# Patient Record
Sex: Male | Born: 1935 | Race: White | Hispanic: No | Marital: Married | Smoking: Former smoker
Health system: Southern US, Community
[De-identification: ages and names within clinical notes are randomized; demographics above are authoritative.]

## PROBLEM LIST (undated history)

## (undated) DIAGNOSIS — M1990 Unspecified osteoarthritis, unspecified site: Secondary | ICD-10-CM

## (undated) DIAGNOSIS — R972 Elevated prostate specific antigen [PSA]: Secondary | ICD-10-CM

## (undated) DIAGNOSIS — E279 Disorder of adrenal gland, unspecified: Secondary | ICD-10-CM

## (undated) DIAGNOSIS — I351 Nonrheumatic aortic (valve) insufficiency: Secondary | ICD-10-CM

## (undated) DIAGNOSIS — I10 Essential (primary) hypertension: Secondary | ICD-10-CM

## (undated) DIAGNOSIS — I639 Cerebral infarction, unspecified: Secondary | ICD-10-CM

## (undated) DIAGNOSIS — I440 Atrioventricular block, first degree: Secondary | ICD-10-CM

## (undated) HISTORY — PX: TONSILLECTOMY AND ADENOIDECTOMY: SHX28

## (undated) HISTORY — DX: Essential (primary) hypertension: I10

## (undated) HISTORY — PX: PELVIC FRACTURE SURGERY: SHX119

## (undated) HISTORY — PX: OTHER SURGICAL HISTORY: SHX169

---

## 1987-09-21 HISTORY — PX: HAND SURGERY: SHX662

## 2001-01-22 ENCOUNTER — Ambulatory Visit (HOSPITAL_COMMUNITY): Admission: RE | Admit: 2001-01-22 | Discharge: 2001-01-22 | Payer: Self-pay | Admitting: Gastroenterology

## 2001-01-22 ENCOUNTER — Encounter (INDEPENDENT_AMBULATORY_CARE_PROVIDER_SITE_OTHER): Payer: Self-pay | Admitting: Specialist

## 2008-07-19 ENCOUNTER — Ambulatory Visit (HOSPITAL_COMMUNITY): Admission: RE | Admit: 2008-07-19 | Discharge: 2008-07-19 | Payer: Self-pay | Admitting: Orthopedic Surgery

## 2010-03-16 ENCOUNTER — Ambulatory Visit: Payer: Self-pay | Admitting: Cardiology

## 2010-09-18 ENCOUNTER — Ambulatory Visit (INDEPENDENT_AMBULATORY_CARE_PROVIDER_SITE_OTHER): Payer: Commercial Managed Care - PPO | Admitting: Cardiology

## 2010-09-18 DIAGNOSIS — I119 Hypertensive heart disease without heart failure: Secondary | ICD-10-CM

## 2010-12-26 NOTE — Op Note (Signed)
Nicholas Hancock, Nicholas Hancock               ACCOUNT NO.:  [id number]   MEDICAL RECORD NO.:  [id number]          PATIENT TYPE:  AMB   LOCATION:  DAY                          FACILITY:  [facility]   PHYSICIAN:  Nicholas Frankel. Charlann Hancock, M.D.  DATE OF BIRTH:  11/19/1934   DATE OF PROCEDURE:  DATE OF DISCHARGE:                               OPERATIVE REPORT   PREOPERATIVE DIAGNOSES:  Left knee medial meniscal tear associated with  mild to moderate degenerative changes.   POSTOPERATIVE DIAGNOSIS/FINDINGS:  1. Midbody central tear into the medial meniscus with associated      cleavage tear.  2. Lateral meniscal tear with either evidence of chronicity or      postoperative with major anterior horn midbody involvement.  3. Grade 2 to 3 predominant changes over the medial aspect of the      medial femoral condyle; otherwise, relatively stable.  Some      thinning in the medial compartment both on the femoral and tibial      surfaces.  4. Grade 2 to 3 changes with some thinning in the patellofemoral      compartment both in the medial and lateral facets.  5. 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  6. Synovitis.   PROCEDURE:  1. Left knee diagnostic and operative arthroscopy with medial and      lateral partial meniscectomies.  2. Medial and lateral and patellar femoral chondroplasties,      debridements of loose flaps of cartilage.  3. Synovectomy.   SURGEON:  Nicholas Frankel. Charlann Hancock, M.D.   ASSISTANT:  None.   ANESTHESIA:  Local plus a MAC-administered anesthesia.   BLOOD LOSS:  None.   SPECIMENS:  None.   COMPLICATIONS:  None.   INDICATIONS FOR PROCEDURE:  Nicholas Hancock is a 76 year old, active male  who presented in the office with recurrent left knee pain associated  with mechanical symptoms.  Workup was consistent with meniscal  pathology.  Radiographically, he appeared to have a stable joint.  We  discussed the options at this point failing  conservative measures.  He  wishes to proceed with the conservative arthroscopics option versus  arthroplasty.  Risks of progression of arthritis, infection, DVT were  reviewed.  Consent was obtained.   PROCEDURE IN DETAIL:  The patient was brought to the operative theater.  Once adequate anesthesia, preoperative antibiotics, Ancef administered,  the patient was positioned supine.  The left leg was placed in the leg  holder.  The left lower extremity was prescrubbed and prepped and draped  in a sterile fashion.  Following a timeout identifying the patient and  his extremity, the portal sites were re-injected with local anesthetic.  The inferolateral portal, inferomedial portal, and superior medial  portals were created.  Diagnostic evaluation of the knee revealed the  above findings.  The inferomedial portal was my working portal.  Attention was first directed to the medial compartment with a 3.5 Cuda  shaver was used to debride this midbody.  A straight biting basket was  used to  trim and then stabilize the posterior horn/anterior horn  junctions.   Following this, I attended to the lateral compartment.  The patient was  noted to have an ACL deficient type appearance to his knee.  Laterally,  he had the meniscal pathology which was debrided with a 3.5 shaver.  I  used this also to debride some of the chondroplasts, both of the femoral  and tibial surfaces.   Anteriorly, synovectomy revealed that for the majority worse in the  patellofemoral joint was relatively intact.  There was some thinning to  the cartilage surfaces.  There were some flaps of cartilage both at the  apex and trochlear regions that contact was known.  Following this  debridement, I re-examined the knee to make sure there were no loose  fragments of cartilage.  Instrumentation was subsequently removed.  Portal sites were re-approximated using a 4-0 nylon.  I then injected  the knee with 0.25% Marcaine.  The knee was  then dressed into a sterile  bulky Jones dressing.  He was brought to the recovery room in stable  condition, tolerating the procedure well.      Nicholas Hancock, M.D.  Electronically Signed     MDO/MEDQ  D:  07/19/2008  T:  07/19/2008  Job:  [id number]

## 2010-12-29 NOTE — Procedures (Signed)
[place]  Patient:    Nicholas Hancock, Nicholas Hancock                        MRN: [id number] Proc. Date: 01/22/01 Attending:  Florencia Reasons, M.D. CC:         Clovis Pu. Patty Sermons, M.D.  Tera Mater. Evlyn Kanner, M.D.   Procedure Report  PROCEDURE:  Colonoscopy with polypectomy and biopsies.  INDICATIONS:  The patient is a 76 year old gentleman, asymptomatic, for screening for colon cancer.  FINDINGS:  Irregular, somewhat worrisome 1.7 cm polyp removed from the region of the rectosigmoid junction.  Two smaller polyps biopsied from the proximal colon.  INFORMED CONSENT:  The nature, purpose, and risks of the procedure have been discussed with the patient who provided written consent.  DESCRIPTION OF PROCEDURE:  Sedation was fentanyl 50 mcg and Versed 6 mg IV without arrhythmias or desaturation.  Digital examination of the prostate was unremarkable.  The Olympus adjustable tension pediatric video colonoscope was advanced the colon with some looping (adult colonoscope may work better on future occasions).  The base of the cecum was reached as confirmed by visualization of the ileocecal valve and the absence of further lumen, and pullback was then performed.  The quality of prep was excellent and it is felt that all areas are well seen.  There were two small sessile 2-3 mm polyps removed by cold biopsy technique, one just above the cecum, the other, I believe in the mid to distal ascending colon.  The main finding on this examination was a 1.7 cm pedunculated erythematous polyp with exudate and some irregularity of its surface.  This was removed by snare technique with good hemostasis and no evidence of excessive cautery. The base of tissue around the stalk had a somewhat verrucous character to it raising a question of whether this could be some sort of a spreading villous adenoma lesion, so several biopsies were obtained from that area to rule out adenomatous  change.  The polyp was pulled out through the anus with the help of tripod forceps, intact in one piece.  Retroflexion in the rectum was normal.  No colitis, vascular malformations or diverticular disease were appreciated during this examination.  The patient tolerated it very well, and there were no apparent complications.  IMPRESSION:  Colon polyps removed as described above.  PLAN:  Await pathology.  It should be noted that the area adjacent to the polypectomy site was injected x2 with [place] ink to mark the spot because the appearance of the polyp was somewhat worrisome to me. DD:  01/22/01 TD:  01/22/01 Job: [id number]

## 2011-02-20 ENCOUNTER — Telehealth: Payer: Self-pay | Admitting: Cardiology

## 2011-02-20 NOTE — Telephone Encounter (Signed)
Patient needs appointment for 43m f/u, no problems.   Please call.

## 2011-02-20 NOTE — Telephone Encounter (Signed)
Left message

## 2011-05-18 LAB — BASIC METABOLIC PANEL
CO2: 26 mEq/L (ref 19–32)
Chloride: 105 mEq/L (ref 96–112)
GFR calc Af Amer: >60 mL/min (ref >60)
Sodium: 136 mEq/L (ref 135–145)

## 2011-05-18 LAB — DIFFERENTIAL
Basophils Relative: 0 % (ref 0–1)
Monocytes Absolute: 0.5 10*3/uL (ref 0.1–1.0)
Monocytes Relative: 8 % (ref 3–12)
Neutro Abs: 4.5 10*3/uL (ref 1.7–7.7)

## 2011-05-18 LAB — CBC
Hemoglobin: 14.2 g/dL (ref 13.0–17.0)
MCHC: 34.7 g/dL (ref 30.0–36.0)
MCV: 92.2 fL (ref 78.0–100.0)
RBC: 4.44 MIL/uL (ref 4.22–5.81)

## 2011-05-21 ENCOUNTER — Other Ambulatory Visit: Payer: Self-pay | Admitting: Cardiology

## 2011-05-21 ENCOUNTER — Encounter: Payer: Self-pay | Admitting: *Deleted

## 2011-05-22 ENCOUNTER — Encounter: Payer: Self-pay | Admitting: Cardiology

## 2011-05-22 ENCOUNTER — Telehealth: Payer: Self-pay | Admitting: Cardiology

## 2011-05-22 ENCOUNTER — Ambulatory Visit (INDEPENDENT_AMBULATORY_CARE_PROVIDER_SITE_OTHER): Payer: Medicare Other | Admitting: Cardiology

## 2011-05-22 VITALS — BP 140/80 | HR 72 | Ht 68.0 in | Wt 158.0 lb

## 2011-05-22 DIAGNOSIS — I119 Hypertensive heart disease without heart failure: Secondary | ICD-10-CM

## 2011-05-22 MED ORDER — SPIRONOLACTONE 50 MG PO TABS: 50.0000 mg | ORAL_TABLET | Freq: Every day | ORAL | Status: DC

## 2011-05-22 NOTE — Telephone Encounter (Signed)
Pharmacist has question about rx you called in

## 2011-05-22 NOTE — Progress Notes (Signed)
[name] Date of Birth:  December 01, 1934 [facility] [place] [facility] [address]   [address] [place], [place]  [address] [phone]           Fax   [phone]  HPI: This pleasant 76 year old gentleman is seen for a six-month followup office visit.  He has a history of high blood pressure, which has responded nicely to spironolactone.  The patient has not been expressing of breath.  About 2 months ago.  He had one episode of what he felt was acute indigestion.  He had chest and epigastric pressure after eating a lunch in a hurry.  He also had some left arm discomfort.  He took an aspirin and the pain resolved immediately.  He has had no recurrence.  He walks 3 miles a day, up and down hills, with no symptoms.  Current Outpatient Prescriptions  Medication Sig Dispense Refill  . CALCIUM PO Take 1 tablet by mouth daily.        . Cholecalciferol (VITAMIN D-3) 5000 UNITS TABS Take 1 tablet by mouth daily.        . Coconut Oil OIL Take by mouth. (  Cooks with  )       . Cyanocobalamin (VITAMIN B-12 PO) Take 1 tablet by mouth daily.        Marland Kitchen KRILL OIL PO Take 1 capsule by mouth daily.        . multivitamin (THERAGRAN) per tablet Take 1 tablet by mouth daily.        . NON FORMULARY Take 1 capsule by mouth daily. Polycosonal       . Saw Palmetto, Serenoa repens, (SAW PALMETTO PO) Take 1 capsule by mouth daily.        Marland Kitchen spironolactone (ALDACTONE) 50 MG tablet Take 1 tablet (50 mg total) by mouth daily.  90 tablet  3  . vitamin C (ASCORBIC ACID) 500 MG tablet Take 500 mg by mouth daily.          No Known Allergies  Patient Active Problem List  Diagnoses  . 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 Former Smoker  . Types: Cigarettes  . Quit date: 05/20/1985  Smokeless tobacco  . Not on file    History  Alcohol Use No    Family History  Problem Relation Age of Onset  . Stroke Father   . Diabetes Father     `  . Heart  disease Mother   . Cancer Mother     Review of Systems: The patient denies any heat or cold intolerance.  No weight gain or weight loss.  The patient denies headaches or blurry vision.  There is no cough or sputum production.  The patient denies dizziness.  There is no hematuria or hematochezia.  The patient denies any muscle aches or arthritis.  The patient denies any rash.  The patient denies frequent falling or instability.  There is no history of depression or anxiety.  All other systems were reviewed and are negative.   Physical Exam: Filed Vitals:   05/22/11 1030  BP: 140/80  Pulse: 72   general appearance reveals a well-developed, well-nourished, gentleman in no distress.  The patient is wearing new hearing aids.  He is very pleased with them.  They are not obvious at all to see and they work very well.Pupils equal and reactive.   Extraocular Movements are full.  There is no scleral icterus.  The mouth and pharynx are normal.  The neck is supple.  The carotids reveal no bruits.  The jugular venous pressure is normal.  The thyroid is not enlarged.  There is no lymphadenopathy.  The chest is clear to percussion and auscultation. There are no rales or rhonchi. Expansion of the chest is symmetrical.  The precordium is quiet.  The first heart sound is normal.  The second heart sound is physiologically split.  There is no murmur gallop rub or click.  There is no abnormal lift or heave.  The abdomen is soft and nontender. Bowel sounds are normal. The liver and spleen are not enlarged. There Are no abdominal masses. There are no bruits.  The pedal pulses are good.  There is no phlebitis or edema.  There is no cyanosis or clubbing. Strength is normal and symmetrical in all extremities.  There is no lateralizing weakness.  There are no sensory deficits.  The skin is warm and dry.  There is no rash.   EKG today shows normal sinus rhythm with left axis deviation and incomplete right bundle  branch block, unchanged since 09/13/08    Assessment / Plan:  Continue spironolactone 50 mg daily.  Continue regular walking exercise.  Check in 6 months for followup office visit, or sooner when necessary

## 2011-05-22 NOTE — Patient Instructions (Signed)
The current medical regimen is effective;  continue present plan and medications.  

## 2011-05-22 NOTE — Assessment & Plan Note (Signed)
The patient remains on lactone 50 mg daily for blood pressure control.  Generally, his blood pressure control has been excellent.  He ran out of the spironolactone.  Several days ago.  Yesterday his pharmacist gave him enough until he could have his appointment with [place] today.  Blood pressure was up slightly today from what it usually runs, but it may be because he was out of his spironolactone for 3 or 4 days prior to yesterday.  He has not had any recent chest discomfort or dizziness, palpitations, or headache or other symptoms of high blood pressure

## 2011-05-22 NOTE — Telephone Encounter (Signed)
Spoke with pharmacist and everything clear on their end with refill

## 2011-06-26 ENCOUNTER — Other Ambulatory Visit: Payer: Self-pay | Admitting: Gastroenterology

## 2012-05-09 ENCOUNTER — Other Ambulatory Visit: Payer: Self-pay | Admitting: Cardiology

## 2012-06-11 ENCOUNTER — Ambulatory Visit (INDEPENDENT_AMBULATORY_CARE_PROVIDER_SITE_OTHER): Payer: Medicare Other | Admitting: Surgery

## 2012-06-13 ENCOUNTER — Encounter (INDEPENDENT_AMBULATORY_CARE_PROVIDER_SITE_OTHER): Payer: Self-pay | Admitting: Surgery

## 2012-06-13 ENCOUNTER — Ambulatory Visit (INDEPENDENT_AMBULATORY_CARE_PROVIDER_SITE_OTHER): Payer: Medicare Other | Admitting: Surgery

## 2012-06-13 VITALS — BP 122/84 | HR 84 | Temp 97.4°F | Resp 25 | Ht 68.0 in | Wt 159.0 lb

## 2012-06-13 DIAGNOSIS — E275 Adrenomedullary hyperfunction: Secondary | ICD-10-CM

## 2012-06-13 DIAGNOSIS — E270 Other adrenocortical overactivity: Secondary | ICD-10-CM

## 2012-06-13 DIAGNOSIS — K4020 Bilateral inguinal hernia, without obstruction or gangrene, not specified as recurrent: Secondary | ICD-10-CM

## 2012-06-13 NOTE — Patient Instructions (Signed)
Hernia Repair with Laparoscope A hernia occurs when an internal organ pushes out through a weak spot in the belly (abdominal) wall muscles. Hernias most commonly occur in the groin and around the navel. Hernias can also occur through a cut by the surgeon (incision) after an abdominal operation. A hernia may be caused by:  Lifting heavy objects.  Prolonged coughing.  Straining to move your bowels. Hernias can often be pushed back into place (reduced). Most hernias tend to get worse over time. Problems occur when abdominal contents get stuck in the opening and the blood supply is blocked or impaired (incarcerated hernia). Because of these risks, you require surgery to repair the hernia. Your hernia will be repaired using a laparoscope. Laparoscopic surgery is a type of minimally invasive surgery. It does not involve making a typical surgical cut (incision) in the skin. A laparoscope is a telescope-like rod and lens system. It is usually connected to a video camera and a light source so your caregiver can clearly see the operative area. The instruments are inserted through  to  inch (5 mm or 10 mm) openings in the skin at specific locations. A working and viewing space is created by blowing a small amount of carbon dioxide gas into the abdominal cavity. The abdomen is essentially blown up like a balloon (insufflated). This elevates the abdominal wall above the internal organs like a dome. The carbon dioxide gas is common to the human body and can be absorbed by tissue and removed by the respiratory system. Once the repair is completed, the small incisions will be closed with either stitches (sutures) or staples (just like a paper stapler only this staple holds the skin together). LET YOUR CAREGIVERS KNOW ABOUT:  Allergies.  Medications taken including herbs, eye drops, over the counter medications, and creams.  Use of steroids (by mouth or creams).  Previous problems with anesthetics or  Novocaine.  Possibility of pregnancy, if this applies.  History of blood clots (thrombophlebitis).  History of bleeding or blood problems.  Previous surgery.  Other health problems. BEFORE THE PROCEDURE  Laparoscopy can be done either in a hospital or out-patient clinic. You may be given a mild sedative to help you relax before the procedure. Once in the operating room, you will be given a general anesthesia to make you sleep (unless you and your caregiver choose a different anesthetic).  AFTER THE PROCEDURE  After the procedure you will be watched in a recovery area. Depending on what type of hernia was repaired, you might be admitted to the hospital or you might go home the same day. With this procedure you may have less pain and scarring. This usually results in a quicker recovery and less risk of infection. HOME CARE INSTRUCTIONS   Bed rest is not required. You may continue your normal activities but avoid heavy lifting (more than 10 pounds) or straining.  Cough gently. If you are a smoker it is best to stop, as even the best hernia repair can break down with the continual strain of coughing.  Avoid driving until given the OK by your surgeon.  There are no dietary restrictions unless given otherwise.  TAKE ALL MEDICATIONS AS DIRECTED.  Only take over-the-counter or prescription medicines for pain, discomfort, or fever as directed by your caregiver. SEEK MEDICAL CARE IF:   There is increasing abdominal pain or pain in your incisions.  There is more bleeding from incisions, other than minimal spotting.  You feel light headed or faint.  You   develop an unexplained fever, chills, and/or an oral temperature above 102 F (38.9 C).  You have redness, swelling, or increasing pain in the wound.  Pus coming from wound.  A foul smell coming from the wound or dressings. SEEK IMMEDIATE MEDICAL CARE IF:   You develop a rash.  You have difficulty breathing.  You have any  allergic problems. MAKE SURE YOU:   Understand these instructions.  Will watch your condition.  Will get help right away if you are not doing well or get worse. Document Released: 07/30/2005 Document Revised: 10/22/2011 Document Reviewed: 06/29/2009 [facility] Patient Information 2013 [place], [place]. Inguinal Hernia, Adult Muscles help keep everything in the body in its proper place. But if a weak spot in the muscles develops, something can poke through. That is called a hernia. When this happens in the lower part of the belly (abdomen), it is called an inguinal hernia. (It takes its name from a part of the body in this region called the inguinal canal.) A weak spot in the wall of muscles lets some fat or part of the small intestine bulge through. An inguinal hernia can develop at any age. Men get them more often than women. CAUSES  In adults, an inguinal hernia develops over time.  It can be triggered by:  Suddenly straining the muscles of the lower abdomen.  Lifting heavy objects.  Straining to have a bowel movement. Difficult bowel movements (constipation) can lead to this.  Constant coughing. This may be caused by smoking or lung disease.  Being overweight.  Being pregnant.  Working at a job that requires long periods of standing or heavy lifting.  Having had an inguinal hernia before. One type can be an emergency situation. It is called a strangulated inguinal hernia. It develops if part of the small intestine slips through the weak spot and cannot get back into the abdomen. The blood supply can be cut off. If that happens, part of the intestine may die. This situation requires emergency surgery. SYMPTOMS  Often, a small inguinal hernia has no symptoms. It is found when a healthcare provider does a physical exam. Larger hernias usually have symptoms.   In adults, symptoms may include:  A lump in the groin. This is easier to see when the person is standing. It might disappear  when lying down.  In men, a lump in the scrotum.  Pain or burning in the groin. This occurs especially when lifting, straining or coughing.  A dull ache or feeling of pressure in the groin.  Signs of a strangulated hernia can include:  A bulge in the groin that becomes very painful and tender to the touch.  A bulge that turns red or purple.  Fever, nausea and vomiting.  Inability to have a bowel movement or to pass gas. DIAGNOSIS  To decide if you have an inguinal hernia, a healthcare provider will probably do a physical examination.  This will include asking questions about any symptoms you have noticed.  The healthcare provider might feel the groin area and ask you to cough. If an inguinal hernia is felt, the healthcare provider may try to slide it back into the abdomen.  Usually no other tests are needed. TREATMENT  Treatments can vary. The size of the hernia makes a difference. Options include:  Watchful waiting. This is often suggested if the hernia is small and you have had no symptoms.  No medical procedure will be done unless symptoms develop.  You will need to watch  closely for symptoms. If any occur, contact your healthcare provider right away.  Surgery. This is used if the hernia is larger or you have symptoms.  Open surgery. This is usually an outpatient procedure (you will not stay overnight in a hospital). An cut (incision) is made through the skin in the groin. The hernia is put back inside the abdomen. The weak area in the muscles is then repaired by herniorrhaphy or hernioplasty. Herniorrhaphy: in this type of surgery, the weak muscles are sewn back together. Hernioplasty: a patch or mesh is used to close the weak area in the abdominal wall.  Laparoscopy. In this procedure, a surgeon makes small incisions. A thin tube with a tiny video camera (called a laparoscope) is put into the abdomen. The surgeon repairs the hernia with mesh by looking with the video camera  and using two long instruments. HOME CARE INSTRUCTIONS   After surgery to repair an inguinal hernia:  You will need to take pain medicine prescribed by your healthcare provider. Follow all directions carefully.  You will need to take care of the wound from the incision.  Your activity will be restricted for awhile. This will probably include no heavy lifting for several weeks. You also should not do anything too active for a few weeks. When you can return to work will depend on the type of job that you have.  During "watchful waiting" periods, you should:  Maintain a healthy weight.  Eat a diet high in fiber (fruits, vegetables and whole grains).  Drink plenty of fluids to avoid constipation. This means drinking enough water and other liquids to keep your urine clear or pale yellow.  Do not lift heavy objects.  Do not stand for long periods of time.  Quit smoking. This should keep you from developing a frequent cough. SEEK MEDICAL CARE IF:   A bulge develops in your groin area.  You feel pain, a burning sensation or pressure in the groin. This might be worse if you are lifting or straining.  You develop a fever of more than 100.5 F (38.1 C). SEEK IMMEDIATE MEDICAL CARE IF:   Pain in the groin increases suddenly.  A bulge in the groin gets bigger suddenly and does not go down.  For men, there is sudden pain in the scrotum. Or, the size of the scrotum increases.  A bulge in the groin area becomes red or purple and is painful to touch.  You have nausea or vomiting that does not go away.  You feel your heart beating much faster than normal.  You cannot have a bowel movement or pass gas.  You develop a fever of more than 102.0 F (38.9 C). Document Released: 12/16/2008 Document Revised: 10/22/2011 Document Reviewed: 12/16/2008 [facility] Patient Information 2013 [place], [place].

## 2012-06-13 NOTE — Progress Notes (Signed)
Chief Complaint:  Bilateral inguinal herniae-left >right  History of Present Illness:  Nicholas Hancock is an 77 y.o. male is referred by Dr. Darvin Neighbours with 4 year history of increasing LIH.  No symptoms of obstruction.    Past Medical History  Diagnosis Date  . Hypertension     reaponding to Spironolactone  . Vitamin D deficiency     Past Surgical History  Procedure Date  . Pelvic fracture surgery     chippped in high school  . Tonsillectomy and adenoidectomy   . Hand surgery 09/21/1987    Enlarged mass on right palm overlying right long finger metacarpophalangeal joint removed    Current Outpatient Prescriptions  Medication Sig Dispense Refill  . CALCIUM PO Take 1 tablet by mouth daily.        . Cholecalciferol (VITAMIN D-3) 5000 UNITS TABS Take 1 tablet by mouth daily.        . Coconut Oil OIL Take by mouth. (  Cooks with  )       . Cyanocobalamin (VITAMIN B-12 PO) Take 1 tablet by mouth daily.        Marland Kitchen KRILL OIL PO Take 1 capsule by mouth daily.        . multivitamin (THERAGRAN) per tablet Take 1 tablet by mouth daily.        . NON FORMULARY Take 1 capsule by mouth daily. Polycosonal       . Saw Palmetto, Serenoa repens, (SAW PALMETTO PO) Take 1 capsule by mouth daily.        Marland Kitchen spironolactone (ALDACTONE) 50 MG tablet TAKE 1 TABLET BY MOUTH EVERY DAY  90 tablet  0  . vitamin C (ASCORBIC ACID) 500 MG tablet Take 500 mg by mouth daily.         Review of patient's allergies indicates no known allergies. Family History  Problem Relation Age of Onset  . Stroke Father   . Diabetes Father     `  . Heart disease Mother   . Cancer Mother     unknown  . Cancer Father     unknown   Social History:   reports that he quit smoking about 27 years ago. His smoking use included Cigarettes. He does not have any smokeless tobacco history on file. He reports that he drinks alcohol. He reports that he does not use illicit drugs.   REVIEW OF SYSTEMS - PERTINENT POSITIVES ONLY: No DVT.   Long distance walker  Physical Exam:   Blood pressure 122/84, pulse 84, temperature 97.4 F (36.3 C), temperature source Temporal, resp. rate 25, height 5\' 8"  (1.727 m), weight 159 lb (72.122 kg). Body mass index is 24.18 kg/(m^2).  Gen:  WDWN WM NAD  Neurological: Alert and oriented to person, place, and time. Motor and sensory function is grossly intact  Head: Normocephalic and atraumatic.  Eyes: Conjunctivae are normal. Pupils are equal, round, and reactive to light. No scleral icterus.  Neck: Normal range of motion. Neck supple. No tracheal deviation or thyromegaly present.  Cardiovascular:  SR without murmurs or gallops.  No carotid bruits Respiratory: Effort normal.  No respiratory distress. No chest wall tenderness. Breath sounds normal.  No wheezes, rales or rhonchi.  Abdomen:  Obvious bulge in the left inguinal region and small bulge in the left inguinal region.  GU: Musculoskeletal: Normal range of motion. Extremities are nontender. No cyanosis, edema or clubbing noted Lymphadenopathy: No cervical, preauricular, postauricular or axillary adenopathy is present Skin: Skin is warm  and dry. No rash noted. No diaphoresis. No erythema. No pallor. Pscyh: Normal mood and affect. Behavior is normal. Judgment and thought content normal.   LABORATORY RESULTS: No results found for this or any previous visit (from the past 48 hour(s)).  RADIOLOGY RESULTS: No results found.  Problem List: Patient Active Problem List  Diagnosis  . Benign hypertensive heart disease without heart failure  . 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 Bilateral inguinal hernia-left>right    Assessment & Plan: Bilateral inguinal hernia left greater than right.  I have discussed laparoscopic and open repairs and gave him a booklet about this.   Plan :   Laparoscopic bilateral inguinal hernia at [facility] B. Daphine Deutscher, MD, [facility] [phone] beeper [phone]  06/13/2012  10:29 AM

## 2012-07-02 ENCOUNTER — Encounter (HOSPITAL_COMMUNITY): Payer: Self-pay | Admitting: Pharmacy Technician

## 2012-07-04 NOTE — Patient Instructions (Addendum)
22 Nicholas Hancock  07/04/2012   Your procedure is scheduled on: 07/15/12  Report to [facility] at 0700AM.  Call this number if you have problems the morning of surgery [phone]: [phone]   Remember: Nicholas Hancock enema night before surgery   Do not eat food or drink liquids After Midnight.     Take these medicines the morning of surgery with A SIP OF WATER: no medications to take   Do not wear jewelry, make-up or nail polish.  Do not wear lotions, powders, or perfumes. You may wear deodorant.  Do not shave 48 hours prior to surgery. Men may shave face and neck.  Do not bring valuables to the hospital.  Contacts, dentures or bridgework may not be worn into surgery.  Leave suitcase in the car. After surgery it may be brought to your room.  For patients admitted to the hospital, checkout time is 11:00 AM the day of discharge.   Patients discharged the day of surgery will not be allowed to drive home.  Name and phone number of your driver: Nicholas Hancock (wife) [phone]   Special Instructions: Shower using CHG 2 nights before surgery and the night before surgery.  If you shower the day of surgery use CHG.  Use special wash - you have one bottle of CHG for all showers.  You should use approximately 1/3 of the bottle for each shower.   Please read over the following fact sheets that you were given: MRSA Information.  [name], RN  pre op nurse call if needed [phone]

## 2012-07-04 NOTE — Progress Notes (Signed)
Last cardiology office visit with Dr. Patty Sermons on 05/22/11 in epic

## 2012-07-07 ENCOUNTER — Ambulatory Visit (HOSPITAL_COMMUNITY)
Admission: RE | Admit: 2012-07-07 | Discharge: 2012-07-07 | Disposition: A | Discharge status: Home / Self Care | Payer: Medicare Other | Source: Ambulatory Visit | Attending: Surgery | Admitting: Surgery

## 2012-07-07 ENCOUNTER — Encounter (HOSPITAL_COMMUNITY)
Admission: RE | Admit: 2012-07-07 | Discharge: 2012-07-07 | Disposition: A | Discharge status: Home / Self Care | Payer: Medicare Other | Source: Ambulatory Visit | Attending: Surgery | Admitting: Surgery

## 2012-07-07 ENCOUNTER — Encounter (HOSPITAL_COMMUNITY): Payer: Self-pay

## 2012-07-07 DIAGNOSIS — Z01812 Encounter for preprocedural laboratory examination: Secondary | ICD-10-CM | POA: Insufficient documentation

## 2012-07-07 DIAGNOSIS — K4020 Bilateral inguinal hernia, without obstruction or gangrene, not specified as recurrent: Secondary | ICD-10-CM | POA: Insufficient documentation

## 2012-07-07 DIAGNOSIS — Z01818 Encounter for other preprocedural examination: Secondary | ICD-10-CM | POA: Insufficient documentation

## 2012-07-07 HISTORY — DX: Disorder of adrenal gland, unspecified: E27.9

## 2012-07-07 HISTORY — DX: Unspecified osteoarthritis, unspecified site: M19.90

## 2012-07-07 HISTORY — DX: Elevated prostate specific antigen (PSA): R97.20

## 2012-07-07 LAB — SURGICAL PCR SCREEN
MRSA, PCR: NEGATIVE
Staphylococcus aureus: POSITIVE — AB

## 2012-07-07 LAB — CBC
MCV: 85.9 fL (ref 78.0–100.0)
Platelets: 175 10*3/uL (ref 150–400)
RBC: 4.54 MIL/uL (ref 4.22–5.81)
RDW: 12.7 % (ref 11.5–15.5)
WBC: 5.5 10*3/uL (ref 4.0–10.5)

## 2012-07-14 MED ORDER — CEFAZOLIN SODIUM-DEXTROSE 2-3 GM-% IV SOLR
2.0000 g | INTRAVENOUS | Status: AC
  Administered 2012-07-15: 2 g via INTRAVENOUS

## 2012-07-15 ENCOUNTER — Encounter (HOSPITAL_COMMUNITY): Payer: Self-pay | Admitting: *Deleted

## 2012-07-15 ENCOUNTER — Ambulatory Visit (HOSPITAL_COMMUNITY): Payer: Medicare Other | Admitting: Anesthesiology

## 2012-07-15 ENCOUNTER — Encounter (HOSPITAL_COMMUNITY): Payer: Self-pay | Admitting: Anesthesiology

## 2012-07-15 ENCOUNTER — Encounter (HOSPITAL_COMMUNITY)
Admission: RE | Disposition: A | Discharge status: Home / Self Care | Payer: Self-pay | Source: Ambulatory Visit | Attending: Surgery

## 2012-07-15 ENCOUNTER — Ambulatory Visit (HOSPITAL_COMMUNITY)
Admission: RE | Admit: 2012-07-15 | Discharge: 2012-07-15 | Disposition: A | Discharge status: Home / Self Care | Payer: Medicare Other | Source: Ambulatory Visit | Attending: Surgery | Admitting: Surgery

## 2012-07-15 DIAGNOSIS — K4020 Bilateral inguinal hernia, without obstruction or gangrene, not specified as recurrent: Secondary | ICD-10-CM

## 2012-07-15 DIAGNOSIS — Z79899 Other long term (current) drug therapy: Secondary | ICD-10-CM | POA: Insufficient documentation

## 2012-07-15 DIAGNOSIS — I119 Hypertensive heart disease without heart failure: Secondary | ICD-10-CM | POA: Insufficient documentation

## 2012-07-15 DIAGNOSIS — K4090 Unilateral inguinal hernia, without obstruction or gangrene, not specified as recurrent: Secondary | ICD-10-CM | POA: Insufficient documentation

## 2012-07-15 HISTORY — PX: INGUINAL HERNIA REPAIR: SHX194

## 2012-07-15 LAB — BASIC METABOLIC PANEL
CO2: 28 mEq/L (ref 19–32)
Chloride: 98 mEq/L (ref 96–112)
Creatinine, Ser: 0.89 mg/dL (ref 0.50–1.35)
GFR calc Af Amer: >90 mL/min (ref >90)
Potassium: 4.2 mEq/L (ref 3.5–5.1)
Sodium: 134 mEq/L — ABNORMAL LOW (ref 135–145)

## 2012-07-15 SURGERY — REPAIR, HERNIA, INGUINAL, BILATERAL, LAPAROSCOPIC
Anesthesia: General | Site: Abdomen | Laterality: Bilateral | Wound class: Clean

## 2012-07-15 MED ORDER — FENTANYL CITRATE 0.05 MG/ML IJ SOLN: 25.0000 ug | INTRAMUSCULAR | Status: DC | PRN

## 2012-07-15 MED ORDER — LACTATED RINGERS IV SOLN: INTRAVENOUS | Status: DC

## 2012-07-15 MED ORDER — CEFAZOLIN SODIUM-DEXTROSE 2-3 GM-% IV SOLR
INTRAVENOUS | Status: AC
  Filled 2012-07-15: qty 50

## 2012-07-15 MED ORDER — ONDANSETRON HCL 4 MG PO TABS
4.0000 mg | ORAL_TABLET | Freq: Four times a day (QID) | ORAL | Status: DC | PRN
  Filled 2012-07-15: qty 1

## 2012-07-15 MED ORDER — FENTANYL CITRATE 0.05 MG/ML IJ SOLN
INTRAMUSCULAR | Status: DC | PRN
  Administered 2012-07-15: 50 ug via INTRAVENOUS
  Administered 2012-07-15: 100 ug via INTRAVENOUS

## 2012-07-15 MED ORDER — CISATRACURIUM BESYLATE (PF) 10 MG/5ML IV SOLN
INTRAVENOUS | Status: DC | PRN
  Administered 2012-07-15: 2 mg via INTRAVENOUS
  Administered 2012-07-15: 8 mg via INTRAVENOUS

## 2012-07-15 MED ORDER — LACTATED RINGERS IV SOLN
INTRAVENOUS | Status: DC
  Administered 2012-07-15: 11:00:00 via INTRAVENOUS
  Administered 2012-07-15: 1000 mL via INTRAVENOUS

## 2012-07-15 MED ORDER — BUPIVACAINE LIPOSOME 1.3 % IJ SUSP
20.0000 mL | Freq: Once | INTRAMUSCULAR | Status: AC
  Administered 2012-07-15: 20 mL
  Filled 2012-07-15: qty 20

## 2012-07-15 MED ORDER — PROMETHAZINE HCL 25 MG/ML IJ SOLN: 6.2500 mg | INTRAMUSCULAR | Status: DC | PRN

## 2012-07-15 MED ORDER — ACETAMINOPHEN 10 MG/ML IV SOLN
INTRAVENOUS | Status: AC
  Filled 2012-07-15: qty 100

## 2012-07-15 MED ORDER — ACETAMINOPHEN 10 MG/ML IV SOLN
INTRAVENOUS | Status: DC | PRN
  Administered 2012-07-15: 1000 mg via INTRAVENOUS

## 2012-07-15 MED ORDER — ONDANSETRON HCL 4 MG/2ML IJ SOLN: 4.0000 mg | Freq: Four times a day (QID) | INTRAMUSCULAR | Status: DC | PRN

## 2012-07-15 MED ORDER — NEOSTIGMINE METHYLSULFATE 1 MG/ML IJ SOLN
INTRAMUSCULAR | Status: DC | PRN
  Administered 2012-07-15: 2 mg via INTRAVENOUS

## 2012-07-15 MED ORDER — HEPARIN SODIUM (PORCINE) 5000 UNIT/ML IJ SOLN
5000.0000 [IU] | Freq: Once | INTRAMUSCULAR | Status: AC
  Administered 2012-07-15: 5000 [IU] via SUBCUTANEOUS
  Filled 2012-07-15: qty 1

## 2012-07-15 MED ORDER — OXYCODONE-ACETAMINOPHEN 5-325 MG PO TABS: 1.0000 | ORAL_TABLET | ORAL | Status: DC | PRN

## 2012-07-15 MED ORDER — MEPERIDINE HCL 50 MG/ML IJ SOLN: 6.2500 mg | INTRAMUSCULAR | Status: DC | PRN

## 2012-07-15 MED ORDER — PROPOFOL 10 MG/ML IV BOLUS
INTRAVENOUS | Status: DC | PRN
  Administered 2012-07-15: 170 mg via INTRAVENOUS

## 2012-07-15 MED ORDER — GLYCOPYRROLATE 0.2 MG/ML IJ SOLN
INTRAMUSCULAR | Status: DC | PRN
  Administered 2012-07-15: .2 mg via INTRAVENOUS

## 2012-07-15 SURGICAL SUPPLY — 49 items
APL SKNCLS STERI-STRIP NONHPOA (GAUZE/BANDAGES/DRESSINGS) ×1
APPLIER CLIP 5 13 M/L LIGAMAX5 (MISCELLANEOUS) ×2
APR CLP MED LRG 5 ANG JAW (MISCELLANEOUS) ×1
BALN DSCT LAPSCP LRG KNDY DSTN (BALLOONS)
BENZOIN TINCTURE PRP APPL 2/3 (GAUZE/BANDAGES/DRESSINGS) ×2 IMPLANT
BINDER ABD UNIV 12 45-62 (WOUND CARE) IMPLANT
BINDER ABDOMINAL 46IN 62IN (WOUND CARE) ×2
CABLE HIGH FREQUENCY MONO STRZ (ELECTRODE) ×2 IMPLANT
CLIP APPLIE 5 13 M/L LIGAMAX5 (MISCELLANEOUS) IMPLANT
CLOTH BEACON ORANGE TIMEOUT ST (SAFETY) ×2 IMPLANT
COVER SURGICAL LIGHT HANDLE (MISCELLANEOUS) ×2 IMPLANT
DECANTER SPIKE VIAL GLASS SM (MISCELLANEOUS) ×2 IMPLANT
DISSECT BALLN SPACEMKR + OVL (BALLOONS) ×2
DISSECT BALLN SPACEMKR OVL PDB (BALLOONS)
DISSECTOR BALLN SPACEMKR + OVL (BALLOONS) ×1 IMPLANT
DISSECTOR BALLN SPCMKR OVL PDB (BALLOONS) IMPLANT
DISSECTOR BLUNT TIP ENDO 5MM (MISCELLANEOUS) IMPLANT
DRAPE LAPAROSCOPIC ABDOMINAL (DRAPES) ×2 IMPLANT
DRSG TEGADERM 2-3/8X2-3/4 SM (GAUZE/BANDAGES/DRESSINGS) IMPLANT
DRSG TEGADERM 4X4.75 (GAUZE/BANDAGES/DRESSINGS) ×1 IMPLANT
ELECT REM PT RETURN 9FT ADLT (ELECTROSURGICAL) ×2
ELECTRODE REM PT RTRN 9FT ADLT (ELECTROSURGICAL) ×1 IMPLANT
GLOVE BIOGEL M 8.0 STRL (GLOVE) ×2 IMPLANT
GLOVE BIOGEL PI IND STRL 7.0 (GLOVE) ×1 IMPLANT
GLOVE BIOGEL PI INDICATOR 7.0 (GLOVE) ×1
GOWN STRL NON-REIN LRG LVL3 (GOWN DISPOSABLE) ×2 IMPLANT
GOWN STRL REIN XL XLG (GOWN DISPOSABLE) ×4 IMPLANT
KIT BASIN OR (CUSTOM PROCEDURE TRAY) ×2 IMPLANT
MARKER SKIN DUAL TIP RULER LAB (MISCELLANEOUS) ×2 IMPLANT
MESH 3DMAX 4X6 LT LRG (Mesh General) ×1 IMPLANT
MESH 3DMAX 4X6 RT LRG (Mesh General) ×1 IMPLANT
NDL INSUFFLATION 14GA 120MM (NEEDLE) IMPLANT
NEEDLE INSUFFLATION 14GA 120MM (NEEDLE) IMPLANT
NS IRRIG 1000ML POUR BTL (IV SOLUTION) ×2 IMPLANT
PENCIL BUTTON HOLSTER BLD 10FT (ELECTRODE) ×2 IMPLANT
SCISSORS LAP 5X35 DISP (ENDOMECHANICALS) IMPLANT
SET IRRIG TUBING LAPAROSCOPIC (IRRIGATION / IRRIGATOR) ×2 IMPLANT
SOLUTION ANTI FOG 6CC (MISCELLANEOUS) ×2 IMPLANT
STOPCOCK 4 WAY LG BORE MALE ST (IV SETS) ×1 IMPLANT
STRIP CLOSURE SKIN 1/2X4 (GAUZE/BANDAGES/DRESSINGS) ×2 IMPLANT
SUT VIC AB 3-0 SH 27 (SUTURE) ×2
SUT VIC AB 3-0 SH 27XBRD (SUTURE) IMPLANT
SUT VIC AB 4-0 SH 18 (SUTURE) ×2 IMPLANT
SYR 30ML LL (SYRINGE) ×2 IMPLANT
TACKER 5MM HERNIA 3.5CML NAB (ENDOMECHANICALS) ×3 IMPLANT
TRAY FOLEY CATH 14FRSI W/METER (CATHETERS) ×2 IMPLANT
TRAY LAP CHOLE (CUSTOM PROCEDURE TRAY) ×2 IMPLANT
TROCAR BLADELESS OPT 5 75 (ENDOMECHANICALS) ×4 IMPLANT
TUBING INSUFFLATION 10FT LAP (TUBING) ×2 IMPLANT

## 2012-07-15 NOTE — Op Note (Signed)
Surgeon: Wenda Low, MD, FACS  Asst:  none  Anes:  general  Procedure: Laparoscopic preperitoneal bilateral inguinal hernia repair with 3 D Max mesh (large)  Diagnosis: Bilateral direct inguinal hernias L>R  Complications: none  EBL:   minimal cc  Description of Procedure:  The patient was taken to room 6 and given general anesthesia. The abdomen was clipped and prepped with PCMX and draped sterilely. A timeout was performed. Transverse incision was made below the umbilicus to the left anterior rectus sheath was incised for about 2 cm. The rectus muscle was retracted laterally and finger dissection was used to establish initial tract for the balloon dissector the balloon dissector was inserted and deployed using the 0 videoscope 10 mm to observe. A good deployment was felt present.  25 mm trochars were placed under laparoscopic vision to the intra-abdominal wall taking care to avoid the inferior epigastric vessels.  Dissection of the hernias ensued and the left hernia was quite large and was a large direct hernia. The hernia was sac was freed allowing it to go up into this and it was quite remarkable. A left side dissection revealed a smaller direct hernia. I saw lipoma the cord was dissected on the left side and of course cartridge were inspected and no indirect hernia seen. Similarly the right side was dissected and no indirect hernia was seen.  A piece of 3-D max mesh was then placed on the left side covering the defect securing it to the inguinal ligament and anterior abdominal wall and laterally where I could palpate the stapler. The 3-D max mesh was overlapped in the midline again secured with the titanium tacker and carried out laterally and secured again with where I could palpate the tacker. When completed the repairs looked good. No bleeding was seen. The pre-peroneal space was then deflated and the fascia of the anterior rectus closed with figure-of-eight suture of 2-0 Vicryl. 4-0  Vicryl was used in the subcutaneous tissue.  Matt B. Daphine Deutscher, MD, [facility], [place] [phone]

## 2012-07-15 NOTE — Anesthesia Preprocedure Evaluation (Addendum)
Anesthesia Evaluation  Patient identified by MRN, date of birth, ID band Patient awake    Reviewed: Allergy & Precautions, H&P , NPO status , Patient's Chart, lab work & pertinent test results  Airway Mallampati: II TM Distance: >3 FB Neck ROM: Full    Dental No notable dental hx.    Pulmonary neg pulmonary ROS,  breath sounds clear to auscultation  Pulmonary exam normal       Cardiovascular hypertension, Pt. on medications negative cardio ROS  Rhythm:Regular Rate:Normal     Neuro/Psych negative neurological ROS  negative psych ROS   GI/Hepatic negative GI ROS, Neg liver ROS,   Endo/Other  negative endocrine ROS  Renal/GU negative Renal ROS  negative genitourinary   Musculoskeletal negative musculoskeletal ROS (+)   Abdominal   Peds negative pediatric ROS (+)  Hematology negative hematology ROS (+)   Anesthesia Other Findings   Reproductive/Obstetrics negative OB ROS                          Anesthesia Physical Anesthesia Plan  ASA: II  Anesthesia Plan: General   Post-op Pain Management:    Induction: Intravenous  Airway Management Planned: Oral ETT  Additional Equipment:   Intra-op Plan:   Post-operative Plan: Extubation in OR  Informed Consent: I have reviewed the patients History and Physical, chart, labs and discussed the procedure including the risks, benefits and alternatives for the proposed anesthesia with the patient or authorized representative who has indicated his/her understanding and acceptance.   Dental advisory given  Plan Discussed with: CRNA  Anesthesia Plan Comments:         Anesthesia Quick Evaluation

## 2012-07-15 NOTE — Transfer of Care (Signed)
Immediate Anesthesia Transfer of Care Note  Patient: Nicholas Hancock  Procedure(s) Performed: Procedure(s) (LRB) with comments: LAPAROSCOPIC BILATERAL INGUINAL HERNIA REPAIR (Bilateral) - Laparoscopic Bilateral Inguinal Hernia Repairs  Patient Location: PACU  Anesthesia Type:General  Level of Consciousness: awake, sedated and patient cooperative  Airway & Oxygen Therapy: Patient Spontanous Breathing and Patient connected to face mask oxygen  Post-op Assessment: Report given to PACU RN and Post -op Vital signs reviewed and stable  Post vital signs: Reviewed and stable  Complications: No apparent anesthesia complications

## 2012-07-15 NOTE — H&P (Signed)
Chief Complaint: Bilateral inguinal herniae-left >right  History of Present Illness: Nicholas Hancock is an 77 y.o. male is referred by Dr. Darvin Neighbours with 4 year history of increasing LIH. No symptoms of obstruction.  Past Medical History   Diagnosis  Date   .  Hypertension      reaponding to Spironolactone   .  Vitamin D deficiency     Past Surgical History   Procedure  Date   .  Pelvic fracture surgery      chippped in high school   .  Tonsillectomy and adenoidectomy    .  Hand surgery  09/21/1987     Enlarged mass on right palm overlying right long finger metacarpophalangeal joint removed    Current Outpatient Prescriptions   Medication  Sig  Dispense  Refill   .  CALCIUM PO  Take 1 tablet by mouth daily.     .  Cholecalciferol (VITAMIN D-3) 5000 UNITS TABS  Take 1 tablet by mouth daily.     .  Coconut Oil OIL  Take by mouth. ( Cooks with )     .  Cyanocobalamin (VITAMIN B-12 PO)  Take 1 tablet by mouth daily.     Marland Kitchen  KRILL OIL PO  Take 1 capsule by mouth daily.     .  multivitamin (THERAGRAN) per tablet  Take 1 tablet by mouth daily.     .  NON FORMULARY  Take 1 capsule by mouth daily. Polycosonal     .  Saw Palmetto, Serenoa repens, (SAW PALMETTO PO)  Take 1 capsule by mouth daily.     Marland Kitchen  spironolactone (ALDACTONE) 50 MG tablet  TAKE 1 TABLET BY MOUTH EVERY DAY  90 tablet  0   .  vitamin C (ASCORBIC ACID) 500 MG tablet  Take 500 mg by mouth daily.      Review of patient's allergies indicates no known allergies.  Family History   Problem  Relation  Age of Onset   .  Stroke  Father    .  Diabetes  Father       `    .  Heart disease  Mother    .  Cancer  Mother       unknown    .  Cancer  Father       unknown    Social History: reports that he quit smoking about 27 years ago. His smoking use included Cigarettes. He does not have any smokeless tobacco history on file. He reports that he drinks alcohol. He reports that he does not use illicit drugs.  REVIEW OF SYSTEMS -  PERTINENT POSITIVES ONLY:  No DVT. Long distance walker  Physical Exam:  Blood pressure 122/84, pulse 84, temperature 97.4 F (36.3 C), temperature source Temporal, resp. rate 25, height 5\' 8"  (1.727 m), weight 159 lb (72.122 kg).  Body mass index is 24.18 kg/(m^2).  Gen: WDWN WM NAD  Neurological: Alert and oriented to person, place, and time. Motor and sensory function is grossly intact  Head: Normocephalic and atraumatic.  Eyes: Conjunctivae are normal. Pupils are equal, round, and reactive to light. No scleral icterus.  Neck: Normal range of motion. Neck supple. No tracheal deviation or thyromegaly present.  Cardiovascular: SR without murmurs or gallops. No carotid bruits  Respiratory: Effort normal. No respiratory distress. No chest wall tenderness. Breath sounds normal. No wheezes, rales or rhonchi.  Abdomen: Obvious bulge in the left inguinal region and small bulge in the left  inguinal region.  GU:  Musculoskeletal: Normal range of motion. Extremities are nontender. No cyanosis, edema or clubbing noted Lymphadenopathy: No cervical, preauricular, postauricular or axillary adenopathy is present Skin: Skin is warm and dry. No rash noted. No diaphoresis. No erythema. No pallor. Pscyh: Normal mood and affect. Behavior is normal. Judgment and thought content normal.  LABORATORY RESULTS:  No results found for this or any previous visit (from the past 48 hour(s)).  RADIOLOGY RESULTS:  No results found.  Problem List:  Patient Active Problem List   Diagnosis   .  Benign hypertensive heart disease without heart failure   .  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  Bilateral inguinal hernia-left>right    Assessment & Plan:  Bilateral inguinal hernia left greater than right. I have discussed laparoscopic and open repairs and gave him a booklet about this. I have answered questions this morning and re explained the approach including open hernia repair.    Plan : Laparoscopic bilateral  inguinal hernia at [facility] B. Daphine Deutscher, MD, [facility]  [phone] beeper  [phone]

## 2012-07-15 NOTE — Anesthesia Postprocedure Evaluation (Signed)
  Anesthesia Post-op Note  Patient: Nicholas Hancock  Procedure(s) Performed: Procedure(s) (LRB): LAPAROSCOPIC BILATERAL INGUINAL HERNIA REPAIR (Bilateral)  Patient Location: PACU  Anesthesia Type: General  Level of Consciousness: awake and alert   Airway and Oxygen Therapy: Patient Spontanous Breathing  Post-op Pain: mild  Post-op Assessment: Post-op Vital signs reviewed, Patient's Cardiovascular Status Stable, Respiratory Function Stable, Patent Airway and No signs of Nausea or vomiting  Last Vitals:  Filed Vitals:   07/15/12 1259  BP: 156/70  Pulse: 59  Temp: 36.1 C  Resp: 16    Post-op Vital Signs: stable   Complications: No apparent anesthesia complications

## 2012-07-16 ENCOUNTER — Encounter (HOSPITAL_COMMUNITY): Payer: Self-pay | Admitting: Surgery

## 2012-07-31 ENCOUNTER — Ambulatory Visit (INDEPENDENT_AMBULATORY_CARE_PROVIDER_SITE_OTHER): Payer: Medicare Other | Admitting: Surgery

## 2012-07-31 ENCOUNTER — Encounter (INDEPENDENT_AMBULATORY_CARE_PROVIDER_SITE_OTHER): Payer: Self-pay | Admitting: Surgery

## 2012-07-31 VITALS — BP 130/78 | HR 68 | Temp 98.7°F | Resp 14 | Ht 68.0 in | Wt 159.2 lb

## 2012-07-31 DIAGNOSIS — K4020 Bilateral inguinal hernia, without obstruction or gangrene, not specified as recurrent: Secondary | ICD-10-CM

## 2012-07-31 NOTE — Progress Notes (Signed)
Nicholas Hancock 77 y.o.  Body mass index is 24.21 kg/(m^2).  Patient Active Problem List  Diagnosis  . Benign hypertensive heart disease without heart failure  . 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 Bilateral inguinal hernia-left>right    No Known Allergies  Past Surgical History  Procedure Date  . Pelvic fracture surgery     chippped in high school  . Tonsillectomy and adenoidectomy   . Hand surgery 09/21/1987    Enlarged mass on right palm overlying right long finger metacarpophalangeal joint removed  . Left knee arthroscopy 3 years ago  . Inguinal hernia repair 07/15/2012    Procedure: LAPAROSCOPIC BILATERAL INGUINAL HERNIA REPAIR;  Surgeon: Valarie Merino, MD;  Location: [facility];  Service: General;  Laterality: Bilateral;  Laparoscopic Bilateral Inguinal Hernia Repairs with Mesh   [name], MD No diagnosis found.  Had no pain after lap bilateral hernia repairs.  Was back walking on PD 2.  Had bilateral direct hernias and can feel small residual fluid pocket on the right as expected after large direct defect. Doing well.  Return prn. Matt B. Daphine Deutscher, MD, [facility] [phone] beeper [phone]  07/31/2012 12:15 PM

## 2012-08-14 ENCOUNTER — Other Ambulatory Visit: Payer: Self-pay | Admitting: Cardiology

## 2012-08-14 DIAGNOSIS — I10 Essential (primary) hypertension: Secondary | ICD-10-CM

## 2012-08-14 MED ORDER — SPIRONOLACTONE 50 MG PO TABS: 50.0000 mg | ORAL_TABLET | Freq: Every day | ORAL | Status: DC

## 2012-08-21 ENCOUNTER — Encounter: Payer: Self-pay | Admitting: Cardiology

## 2012-08-21 ENCOUNTER — Ambulatory Visit (INDEPENDENT_AMBULATORY_CARE_PROVIDER_SITE_OTHER): Payer: Medicare Other | Admitting: Cardiology

## 2012-08-21 VITALS — BP 140/72 | HR 64 | Ht 68.0 in | Wt 157.1 lb

## 2012-08-21 DIAGNOSIS — I10 Essential (primary) hypertension: Secondary | ICD-10-CM

## 2012-08-21 DIAGNOSIS — I119 Hypertensive heart disease without heart failure: Secondary | ICD-10-CM

## 2012-08-21 MED ORDER — SPIRONOLACTONE 50 MG PO TABS: 50.0000 mg | ORAL_TABLET | Freq: Every day | ORAL | Status: DC

## 2012-08-21 NOTE — Patient Instructions (Addendum)
Your physician recommends that you continue on your current medications as directed. Please refer to the Current Medication list given to you today.  Your physician wants you to follow-up in: 1 year. You will receive a reminder letter in the mail two months in advance. If you don't receive a letter, please call our office to schedule the follow-up appointment.  

## 2012-08-21 NOTE — Assessment & Plan Note (Signed)
The patient has not been experiencing any exertional dyspnea.  No chest pain.  No dizziness or syncope.  No palpitations.  He had recent blood work at Dr. Enid Derry office which showed normal renal function and normal electrolytes with a potassium of 4.3.  He remains on spironolactone 50 mg once a day with good blood pressure control

## 2012-08-21 NOTE — Progress Notes (Signed)
Nicholas Hancock Date of Birth:  02-02-1935 [name] [address] [place], [place]  [address] [phone]  Fax   [phone]  HPI: This pleasant 78 year old gentleman is seen for a one-year followup office visit. He has a history of high blood pressure, which has responded nicely to spironolactone. The patient has not been experiencing any shortness of breath.. he continues to walk several miles daily for exercise.  He and his wife continue on a vegan diet although they now 818 per day and may eat salmon occasionally. Since we last saw the patient he had successful left inguinal herniorrhaphy by Dr. Daphine Deutscher.  Current Outpatient Prescriptions  Medication Sig Dispense Refill  . Calcium 167 MG CAPS Take 1 capsule by mouth daily.      . Cholecalciferol (VITAMIN D-3) 5000 UNITS TABS Take 1 tablet by mouth every other day.      . Flaxseed, Linseed, (FLAX SEEDS PO) Take 1 tablet by mouth daily.      Nicholas Hancock Oil 300 MG CAPS Take 1 capsule by mouth daily.      . Multiple Vitamin (MULTIVITAMIN WITH MINERALS) TABS Take 1 tablet by mouth daily.      Marland Kitchen POLICOSANOL PO Take 20 mg by mouth daily.      . Saw Palmetto, Serenoa repens, 320 MG CAPS Take 1 tablet by mouth daily.      Marland Kitchen spironolactone (ALDACTONE) 50 MG tablet Take 1 tablet (50 mg total) by mouth daily before breakfast.  90 tablet  3  . vitamin B-12 (CYANOCOBALAMIN) 1000 MCG tablet Take 1,000 mcg by mouth daily.      . vitamin C (ASCORBIC ACID) 500 MG tablet Take 500 mg by mouth daily.        No Known Allergies  Patient Active Problem List  Diagnosis  . Benign hypertensive heart disease without heart failure  . 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 Bilateral inguinal hernia-left>right    History  Smoking status  . Former Smoker -- 20 years  . Types: Pipe  . Quit date: 05/20/1985  Smokeless tobacco  . Never Used    History  Alcohol Use  . Yes    Comment: approx 4 beers a week    Family History    Problem Relation Age of Onset  . Stroke Father   . Diabetes Father     `  . Heart disease Mother   . Cancer Mother     unknown  . Cancer Father     unknown    Review of Systems: The patient denies any heat or cold intolerance.  No weight gain or weight loss.  The patient denies headaches or blurry vision.  There is no cough or sputum production.  The patient denies dizziness.  There is no hematuria or hematochezia.  The patient denies any muscle aches or arthritis.  The patient denies any rash.  The patient denies frequent falling or instability.  There is no history of depression or anxiety.  All other systems were reviewed and are negative.   Physical Exam: Filed Vitals:   08/21/12 0918  BP: 140/72  Pulse: 64   the general appearance reveals a well-developed well-nourished gentleman in no distress.The head and neck exam reveals pupils equal and reactive.  Extraocular movements are full.  There is no scleral icterus.  The mouth and pharynx are normal.  The neck is supple.  The carotids reveal no bruits.  The jugular venous pressure is normal.  The  thyroid is not  enlarged.  There is no lymphadenopathy.  The chest is clear to percussion and auscultation.  There are no rales or rhonchi.  Expansion of the chest is symmetrical.  The precordium is quiet.  The first heart sound is normal.  The second heart sound is physiologically split.  There is no murmur gallop rub or click.  There is no abnormal lift or heave.  The abdomen is soft and nontender.  The bowel sounds are normal.  The liver and spleen are not enlarged.  There are no abdominal masses.  There are no abdominal bruits.  Extremities reveal good pedal pulses.  There is no phlebitis or edema.  There is no cyanosis or clubbing.  Strength is normal and symmetrical in all extremities.  There is no lateralizing weakness.  There are no sensory deficits.  The skin is warm and dry.  There is no rash.      Assessment / Plan: Continue same  medication.  Check in one year for followup office visit.  We refilled his spironolactone for one year

## 2013-08-25 ENCOUNTER — Ambulatory Visit (INDEPENDENT_AMBULATORY_CARE_PROVIDER_SITE_OTHER): Payer: Medicare Other | Admitting: Cardiology

## 2013-08-25 VITALS — BP 128/64 | HR 62 | Ht 67.5 in | Wt 159.0 lb

## 2013-08-25 DIAGNOSIS — E270 Other adrenocortical overactivity: Secondary | ICD-10-CM

## 2013-08-25 DIAGNOSIS — I119 Hypertensive heart disease without heart failure: Secondary | ICD-10-CM

## 2013-08-25 DIAGNOSIS — E275 Adrenomedullary hyperfunction: Secondary | ICD-10-CM

## 2013-08-25 NOTE — Assessment & Plan Note (Signed)
His blood pressure continues to be very well controlled on spironolactone.  Dr. Evlyn Kanner follows his serum potassium.  He is not having any chest pain or shortness of breath dizziness or syncope or palpitations.

## 2013-08-25 NOTE — Progress Notes (Signed)
Nicholas Hancock Date of Birth:  08/15/1934 [facility] [address] [place], [place]  [address] [phone]  Fax   [phone]  HPI: This pleasant 79 year old gentleman is seen for a one-year followup office visit. He has a history of high blood pressure, which has responded nicely to spironolactone. The patient has not been experiencing any shortness of breath.. he continues to walk several miles daily for exercise.  He and his wife continue on a vegan diet although they now eat an egg per day and may eat salmon occasionally.  Since last visit he has had no new cardiac symptoms.  His weight is up 2 pounds since last visit.   Current Outpatient Prescriptions  Medication Sig Dispense Refill  . Calcium 167 MG CAPS Take 1 capsule by mouth daily.      . Cholecalciferol (VITAMIN D-3) 5000 UNITS TABS Take 1 tablet by mouth every other day.      . Flaxseed, Linseed, (FLAX SEEDS PO) Take 1 tablet by mouth daily.      Boris Lown Oil 300 MG CAPS Take 1 capsule by mouth daily.      . Multiple Vitamin (MULTIVITAMIN WITH MINERALS) TABS Take 1 tablet by mouth daily.      Marland Kitchen POLICOSANOL PO Take 20 mg by mouth daily.      . Saw Palmetto, Serenoa repens, 320 MG CAPS Take 1 tablet by mouth daily.      Marland Kitchen spironolactone (ALDACTONE) 50 MG tablet Take 1 tablet (50 mg total) by mouth daily before breakfast.  90 tablet  3  . vitamin B-12 (CYANOCOBALAMIN) 1000 MCG tablet Take 1,000 mcg by mouth daily.      . vitamin C (ASCORBIC ACID) 500 MG tablet Take 500 mg by mouth daily.       No current facility-administered medications for this visit.    No Known Allergies  Patient Active Problem List   Diagnosis Date Noted  . 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 Bilateral inguinal hernia-left>right 06/13/2012  . Benign hypertensive heart disease without heart failure 05/22/2011    History  Smoking status  . Former Smoker -- 20 years  . Types: Pipe  . Quit date: 05/20/1985   Smokeless tobacco  . Never Used    History  Alcohol Use  . Yes    Comment: approx 4 beers a week    Family History  Problem Relation Age of Onset  . Stroke Father   . Diabetes Father     `  . Heart disease Mother   . Cancer Mother     unknown  . Cancer Father     unknown    Review of Systems: The patient denies any heat or cold intolerance.  No weight gain or weight loss.  The patient denies headaches or blurry vision.  There is no cough or sputum production.  The patient denies dizziness.  There is no hematuria or hematochezia.  The patient denies any muscle aches or arthritis.  The patient denies any rash.  The patient denies frequent falling or instability.  There is no history of depression or anxiety.  All other systems were reviewed and are negative.   Physical Exam: Filed Vitals:   08/25/13 0752  BP: 128/64  Pulse: 62   the general appearance reveals a well-developed well-nourished gentleman in no distress.The head and neck exam reveals pupils equal and reactive.  Extraocular movements are full.  There is no scleral icterus.  The mouth and pharynx are normal.  The neck is supple.  The carotids reveal no bruits.  The jugular venous pressure is normal.  The  thyroid is not enlarged.  There is no lymphadenopathy.  The chest is clear to percussion and auscultation.  There are no rales or rhonchi.  Expansion of the chest is symmetrical.  The precordium is quiet.  The first heart sound is normal.  The second heart sound is physiologically split.  There is no murmur gallop rub or click.  There is no abnormal lift or heave.  The abdomen is soft and nontender.  The bowel sounds are normal.  The liver and spleen are not enlarged.  There are no abdominal masses.  There are no abdominal bruits.  Extremities reveal good pedal pulses.  There is no phlebitis or edema.  There is no cyanosis or clubbing.  Strength is normal and symmetrical in all extremities.  There is no lateralizing  weakness.  There are no sensory deficits.  The skin is warm and dry.  There is no rash.   EKG shows normal sinus rhythm with left axis deviation and incomplete right bundle branch block and nonspecific ST abnormality and is unchanged since 07/07/12.   Assessment / Plan: Continue same medication.  Check in one year for followup office visit.

## 2013-08-25 NOTE — Patient Instructions (Signed)
Your physician recommends that you continue on your current medications as directed. Please refer to the Current Medication list given to you today.  Your physician wants you to follow-up in: 1 year. You will receive a reminder letter in the mail two months in advance. If you don't receive a letter, please call our office to schedule the follow-up appointment.  

## 2013-08-31 ENCOUNTER — Other Ambulatory Visit: Payer: Self-pay | Admitting: Cardiology

## 2014-08-18 ENCOUNTER — Other Ambulatory Visit: Payer: Self-pay | Admitting: Cardiology

## 2014-08-26 ENCOUNTER — Ambulatory Visit (INDEPENDENT_AMBULATORY_CARE_PROVIDER_SITE_OTHER): Payer: Medicare Other | Admitting: Cardiology

## 2014-08-26 ENCOUNTER — Encounter: Payer: Self-pay | Admitting: Cardiology

## 2014-08-26 VITALS — BP 150/82 | HR 65 | Ht 68.0 in | Wt 164.8 lb

## 2014-08-26 DIAGNOSIS — I119 Hypertensive heart disease without heart failure: Secondary | ICD-10-CM

## 2014-08-26 NOTE — Progress Notes (Signed)
Nicholas Hancock Date of Birth:  1934/11/12 [facility] [address] [place], [place]  [address] [phone]  Fax   [phone]  HPI: This pleasant 80 year old gentleman is seen for a one-year followup office visit. He has a history of high blood pressure, which has responded nicely to spironolactone. The patient has not been experiencing any shortness of breath.. he continues to walk several miles daily for exercise. He and his wife continue on a vegan diet although they now eat an egg per day and may eat salmon occasionally. Since last visit he has had no new cardiac symptoms. His weight is up 5 pounds since last visit. He and his wife walk 14 miles a week averaging 2 miles a day.  He has not been having any chest pain or shortness of breath. Dr. Evlyn Kanner is his internist and follows his labs. His only prescription medication is spironolactone 50 mg daily.  Current Outpatient Prescriptions  Medication Sig Dispense Refill  . Calcium 167 MG CAPS Take 1 capsule by mouth daily.    . Cholecalciferol (VITAMIN D-3) 5000 UNITS TABS Take 1 tablet by mouth every other day.    . Flaxseed, Linseed, (FLAX SEEDS PO) Take 1 tablet by mouth daily.    Marland Kitchen FLUZONE HIGH-DOSE 0.5 ML SUSY   0  . Krill Oil 300 MG CAPS Take 1 capsule by mouth daily.    . Multiple Vitamin (MULTIVITAMIN WITH MINERALS) TABS Take 1 tablet by mouth daily.    Marland Kitchen POLICOSANOL PO Take 20 mg by mouth daily.    . Saw Palmetto, Serenoa repens, 320 MG CAPS Take 1 tablet by mouth daily.    Marland Kitchen spironolactone (ALDACTONE) 50 MG tablet TAKE 1 TABLET BY MOUTH EVERY DAY BEFORE BREAKFAST 90 tablet 1  . vitamin B-12 (CYANOCOBALAMIN) 1000 MCG tablet Take 1,000 mcg by mouth daily.    . vitamin C (ASCORBIC ACID) 500 MG tablet Take 500 mg by mouth daily.     No current facility-administered medications for this visit.    No Known Allergies  Patient Active Problem List   Diagnosis Date Noted  . Hyperadrenalism-on  spironolactone 06/13/2012  . Bilateral inguinal hernia-left>right 06/13/2012  . Benign hypertensive heart disease without heart failure 05/22/2011    History  Smoking status  . Former Smoker -- 20 years  . Types: Pipe  . Quit date: 05/20/1985  Smokeless tobacco  . Never Used    History  Alcohol Use  . Yes    Comment: approx 4 beers a week    Family History  Problem Relation Age of Onset  . Stroke Father   . Diabetes Father     `  . Heart disease Mother   . Cancer Mother     unknown  . Cancer Father     unknown    Review of Systems: The patient denies any heat or cold intolerance.  No weight gain or weight loss.  The patient denies headaches or blurry vision.  There is no cough or sputum production.  The patient denies dizziness.  There is no hematuria or hematochezia.  The patient denies any muscle aches or arthritis.  The patient denies any rash.  The patient denies frequent falling or instability.  There is no history of depression or anxiety.  All other systems were reviewed and are negative.   Wt Readings from Last 3 Encounters:  08/26/14 164 lb 12.8 oz (74.753 kg)  08/25/13 159 lb (72.122 kg)  08/21/12 157  lb 1.9 oz (71.269 kg)    Physical Exam: Filed Vitals:   08/26/14 1515  BP: 150/82  Pulse: 65  The patient appears to be in no distress.  Head and neck exam reveals that the pupils are equal and reactive.  The extraocular movements are full.  There is no scleral icterus.  Mouth and pharynx are benign.  No lymphadenopathy.  No carotid bruits.  The jugular venous pressure is normal.  Thyroid is not enlarged or tender.  Chest is clear to percussion and auscultation.  No rales or rhonchi.  Expansion of the chest is symmetrical.  Heart reveals no abnormal lift or heave.  First and second heart sounds are normal.  There is no murmur gallop rub or click.  The abdomen is soft and nontender.  Bowel sounds are normoactive.  There is no hepatosplenomegaly or mass.   There are no abdominal bruits.  Extremities reveal no phlebitis or edema.  Pedal pulses are good.  There is no cyanosis or clubbing.  Neurologic exam is normal strength and no lateralizing weakness.  No sensory deficits.  Integument reveals no rash  EKG shows normal sinus rhythm with left axis deviation and incomplete right bundle branch block and is unchanged since 08/25/13.  Assessment / Plan: 1.  History of hyperaldosteronism, good response to spironolactone once a day 2.  Essential hypertension without heart failure 3.  Status post bilateral inguinal hernia repair by Dr. Daphine Deutscher in 2013.  No problem subsequently. Disposition: Continue on current medication.  Recheck in one year for office visit and EKG

## 2014-08-26 NOTE — Patient Instructions (Signed)
Your physician recommends that you continue on your current medications as directed. Please refer to the Current Medication list given to you today.  Your physician wants you to follow-up in: 1 year ov You will receive a reminder letter in the mail two months in advance. If you don't receive a letter, please call our office to schedule the follow-up appointment.  

## 2014-10-08 ENCOUNTER — Other Ambulatory Visit: Payer: Self-pay | Admitting: Gastroenterology

## 2015-02-22 ENCOUNTER — Other Ambulatory Visit: Payer: Self-pay | Admitting: Cardiology

## 2015-08-20 ENCOUNTER — Other Ambulatory Visit: Payer: Self-pay | Admitting: Cardiology

## 2015-09-08 ENCOUNTER — Ambulatory Visit (INDEPENDENT_AMBULATORY_CARE_PROVIDER_SITE_OTHER): Payer: Medicare Other | Admitting: Cardiology

## 2015-09-08 ENCOUNTER — Encounter: Payer: Self-pay | Admitting: Cardiology

## 2015-09-08 VITALS — BP 150/64 | HR 65 | Ht 68.0 in | Wt 167.8 lb

## 2015-09-08 DIAGNOSIS — E269 Hyperaldosteronism, unspecified: Secondary | ICD-10-CM | POA: Diagnosis not present

## 2015-09-08 DIAGNOSIS — I119 Hypertensive heart disease without heart failure: Secondary | ICD-10-CM | POA: Diagnosis not present

## 2015-09-08 MED ORDER — METOPROLOL SUCCINATE ER 25 MG PO TB24: 12.5000 mg | ORAL_TABLET | Freq: Every day | ORAL | Status: DC

## 2015-09-08 NOTE — Progress Notes (Signed)
Cardiology Office Note   Date:  09/08/2015   ID:  Nicholas Hancock, DOB 1935-04-25, MRN [id number]  PCP:  [name], MD  Cardiologist: Cassell Clement MD  Chief Complaint  Patient presents with  . Annual Exam    benign hypertensive disease without heart failure. Patient denies any chest pain, shortness of breath, le edema, or claudication      History of Present Illness: Nicholas Hancock is a 81 y.o. male who presents for  One-year follow-up visit   He has a history of high blood pressure, which has responded nicely to spironolactone. The patient has not been experiencing any shortness of breath.. he continues to walk several miles daily for exercise. He and his wife continue on a  Vegetarian-type diet although they now eat an egg per day and may eat salmon occasionally. Since last visit he has had no new cardiac symptoms. His weight is up 3 pounds since last visit. He and his wife walk 14 miles a week averaging 2 miles a day. He has not been having any chest pain or shortness of breath. Dr. Evlyn Kanner is his internist and follows his labs. His only prescription medication is spironolactone 50 mg daily.  his blood pressure has been running a little high recently. It is high today as well.  Past Medical History  Diagnosis Date  . Vitamin D deficiency   . Adrenal gland dysfunction (HCC)     hyperaldesteroa- takes diuretic for over production of aldosterone  . Hypertension     takes diuretic for adrenal gland dysfunction, BP will be high without diuretic  . Arthritis     hands  . Elevated PSA     monitored by Dr. Earlene Plater    Past Surgical History  Procedure Laterality Date  . Pelvic fracture surgery      chippped in high school  . Tonsillectomy and adenoidectomy    . Hand surgery  09/21/1987    Enlarged mass on right palm overlying right long finger metacarpophalangeal joint removed  . Left knee arthroscopy  3 years ago  . Inguinal hernia repair  07/15/2012   Procedure: LAPAROSCOPIC BILATERAL INGUINAL HERNIA REPAIR;  Surgeon: Valarie Merino, MD;  Location: [facility];  Service: General;  Laterality: Bilateral;  Laparoscopic Bilateral Inguinal Hernia Repairs with Mesh     Current Outpatient Prescriptions  Medication Sig Dispense Refill  . Astaxanthin 4 MG CAPS Take 4 mg by mouth daily.    . Calcium 167 MG CAPS Take 1 capsule by mouth daily.    . Cholecalciferol (VITAMIN D-3) 5000 UNITS TABS Take 1 tablet by mouth every other day.    . Flaxseed, Linseed, (FLAX SEEDS PO) Take 1 tablet by mouth daily.    . Multiple Vitamin (MULTIVITAMIN WITH MINERALS) TABS Take 1 tablet by mouth daily.    . Omega-3 Fatty Acids (SALMON OIL-1000 PO) Take 1,000 mg by mouth daily.    Marland Kitchen POLICOSANOL PO Take 20 mg by mouth daily.    . Saw Palmetto, Serenoa repens, 320 MG CAPS Take 1 tablet by mouth daily.    Marland Kitchen spironolactone (ALDACTONE) 50 MG tablet TAKE 1 TABLET BY MOUTH EVERY DAY BEFORE BREAKFAST 90 tablet 1  . vitamin B-12 (CYANOCOBALAMIN) 1000 MCG tablet Take 1,000 mcg by mouth daily.    . vitamin C (ASCORBIC ACID) 500 MG tablet Take 500 mg by mouth daily.    . metoprolol succinate (TOPROL XL) 25 MG 24 hr tablet Take 0.5 tablets (12.5 mg total) by  mouth daily. 30 tablet 11   No current facility-administered medications for this visit.    Allergies:   Review of patient's allergies indicates no known allergies.    Social History:  The patient  reports that he quit smoking about 30 years ago. His smoking use included Pipe. He has never used smokeless tobacco. He reports that he drinks alcohol. He reports that he does not use illicit drugs.   Family History:  The patient's family history includes Cancer in his father and mother; Diabetes in his father; Heart disease in his mother; Stroke in his father.    ROS:  Please see the history of present illness.   Otherwise, review of systems are positive for none.   All other systems are reviewed and negative.    PHYSICAL  EXAM: VS:  BP 150/64 mmHg  Pulse 65  Ht 5\' 8"  (1.727 m)  Wt 167 lb 12.8 oz (76.114 kg)  BMI 25.52 kg/m2 , BMI Body mass index is 25.52 kg/(m^2). GEN: Well nourished, well developed, in no acute distress HEENT: normal Neck: no JVD, carotid bruits, or masses Cardiac: RRR; no murmurs, rubs, or gallops,no edema  Respiratory:  clear to auscultation bilaterally, normal work of breathing GI: soft, nontender, nondistended, + BS MS: no deformity or atrophy Skin: warm and dry, no rash Neuro:  Strength and sensation are intact Psych: euthymic mood, full affect   EKG:  EKG is ordered today. The ekg ordered today demonstrates  Normal sinus rhythm with first-degree AV block and frequent premature atrial complexes. Incomplete right bundle branch block. Left axis deviation.   Recent Labs: No results found for requested labs within last 365 days.    Lipid Panel No results found for: CHOL, TRIG, HDL, CHOLHDL, VLDL, LDLCALC, LDLDIRECT    Wt Readings from Last 3 Encounters:  09/08/15 167 lb 12.8 oz (76.114 kg)  08/26/14 164 lb 12.8 oz (74.753 kg)  08/25/13 159 lb (72.122 kg)         ASSESSMENT AND PLAN: 1. Hyperaldosteronism  2.  Premature atrial beats   Current medicines are reviewed at length with the patient today.  The patient does not have concerns regarding medicines.  The following changes have been made:   To help with I blood pressure and with his PACs we will add metoprolol succinate 12.5 mg daily  Labs/ tests ordered today include:   Orders Placed This Encounter  Procedures  . EKG 12-Lead     Disposition:   Return in one year for office visit and EKG with Dr. Anne Fu. His electrolytes are followed by his PCP Dr. Evlyn Kanner  Signed, Cassell Clement MD 09/08/2015 5:35 PM    [facility] [address] [place], [place], [place]  [address] Phone: [phone]; Fax: [phone]

## 2015-09-08 NOTE — Patient Instructions (Signed)
Medication Instructions:   START TAKING TOPROL XL HALF TABLET OF 25MG  ONCE A DAY  If you need a refill on your cardiac medications before your next appointment, please call your pharmacy.  Labwork: NONE ORDER TODAY    Testing/Procedures:  NONE ORDER TODAY    Follow-Up:  Your physician wants you to follow-up in: ONE YEAR WITH  SKAINS  You will receive a reminder letter in the mail two months in advance. If you don't receive a letter, please call our office to schedule the follow-up appointment.     Any Other Special Instructions Will Be Listed Below (If Applicable).

## 2016-02-15 ENCOUNTER — Other Ambulatory Visit: Payer: Self-pay | Admitting: *Deleted

## 2016-02-15 MED ORDER — SPIRONOLACTONE 50 MG PO TABS: ORAL_TABLET | ORAL | Status: DC

## 2016-09-10 ENCOUNTER — Encounter: Payer: Self-pay | Admitting: Cardiology

## 2016-09-10 ENCOUNTER — Ambulatory Visit (INDEPENDENT_AMBULATORY_CARE_PROVIDER_SITE_OTHER): Payer: Medicare Other | Admitting: Cardiology

## 2016-09-10 VITALS — BP 160/94 | HR 61 | Ht 68.0 in | Wt 166.8 lb

## 2016-09-10 DIAGNOSIS — R011 Cardiac murmur, unspecified: Secondary | ICD-10-CM

## 2016-09-10 DIAGNOSIS — E269 Hyperaldosteronism, unspecified: Secondary | ICD-10-CM | POA: Diagnosis not present

## 2016-09-10 DIAGNOSIS — I119 Hypertensive heart disease without heart failure: Secondary | ICD-10-CM

## 2016-09-10 NOTE — Progress Notes (Signed)
Cardiology Office Note    Date:  09/10/2016   ID:  EPHREM CARRICK, DOB November 01, 1934, MRN [id number]  PCP:  [name], MD  Cardiologist:   Donato Schultz, MD     History of Present Illness:  Nicholas Hancock is a 82 y.o. male former patient of Dr. Yevonne Pax here for one-year follow-up, hypertension. In the past, hypertension and responded well to spironolactone. Sized block several miles a day. Dr. Ardyth Harps is his internist. Manages his lab work.  In the past he had first-degree AV block with premature atrial complexes.  Dr. Patty Sermons placed him on metoprolol succinate 12.5 mg a day to help with PACs and blood pressure.  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 This is why he is on the spironolactone.  He still walks 2 miles a day 7 days a week with his wife. No chest pain, no syncope, no orthopnea, no PND. He worked for [employer] for several years.   Past Medical History:  Diagnosis Date  . 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 Arthritis    hands  . Elevated PSA    monitored by Dr. Earlene Plater  . Hypertension    takes diuretic for adrenal gland dysfunction, BP will be high without diuretic  . Vitamin D deficiency     Past Surgical History:  Procedure Laterality Date  . HAND SURGERY  09/21/1987   Enlarged mass on right palm overlying right long finger metacarpophalangeal joint removed  . INGUINAL HERNIA REPAIR  07/15/2012   Procedure: LAPAROSCOPIC BILATERAL INGUINAL HERNIA REPAIR;  Surgeon: Valarie Merino, MD;  Location: [facility];  Service: General;  Laterality: Bilateral;  Laparoscopic Bilateral Inguinal Hernia Repairs with Mesh  . left knee arthroscopy  3 years ago  . PELVIC FRACTURE SURGERY     chippped in high school  . TONSILLECTOMY AND ADENOIDECTOMY      Current Medications: Outpatient Medications Prior to Visit  Medication Sig Dispense Refill  . Astaxanthin 4 MG CAPS  Take 4 mg by mouth daily.    . Calcium 167 MG CAPS Take 1 capsule by mouth daily.    . Cholecalciferol (VITAMIN D-3) 5000 UNITS TABS Take 1 tablet by mouth every other day.    . Flaxseed, Linseed, (FLAX SEEDS PO) Take 1 tablet by mouth daily.    . metoprolol succinate (TOPROL XL) 25 MG 24 hr tablet Take 0.5 tablets (12.5 mg total) by mouth daily. 30 tablet 11  . Multiple Vitamin (MULTIVITAMIN WITH MINERALS) TABS Take 1 tablet by mouth daily.    . Omega-3 Fatty Acids (SALMON OIL-1000 PO) Take 1,000 mg by mouth daily.    Marland Kitchen POLICOSANOL PO Take 20 mg by mouth daily.    . Saw Palmetto, Serenoa repens, 320 MG CAPS Take 1 tablet by mouth daily.    Marland Kitchen spironolactone (ALDACTONE) 50 MG tablet TAKE 1 TABLET BY MOUTH EVERY DAY BEFORE BREAKFAST 90 tablet 2  . vitamin B-12 (CYANOCOBALAMIN) 1000 MCG tablet Take 1,000 mcg by mouth daily.    . vitamin C (ASCORBIC ACID) 500 MG tablet Take 500 mg by mouth daily.     No facility-administered medications prior to visit.      Allergies:   Patient has no known allergies.   Social History   Social History  . Marital status: Married    Spouse name: N/A  . Number of children: N/A  . Years of education: N/A  Social History Main Topics  . Smoking status: Former Smoker    Years: 20.00    Types: Pipe    Quit date: 05/20/1985  . Smokeless tobacco: Never Used  . Alcohol use Yes     Comment: approx 4 beers a week  . Drug use: No  . Sexual activity: Not Asked   Other Topics Concern  . None   Social History Narrative  . None     Family History:  The patient's family history includes Cancer in his father and mother; Diabetes in his father; Heart disease in his mother; Stroke in his father.   ROS:   Please see the history of present illness.    ROS All other systems reviewed and are negative.   PHYSICAL EXAM:   VS:  BP (!) 160/94   Pulse 61   Ht 5\' 8"  (1.727 m)   Wt 166 lb 12.8 oz (75.7 kg)   BMI 25.36 kg/m    GEN: Well nourished, well developed,  in no acute distress  HEENT: normal  Neck: no JVD, carotid bruits, or masses Cardiac: RRR; 2/6 diastolic murmur right upper sternal border, no rubs, or gallops,no edema  Respiratory:  clear to auscultation bilaterally, normal work of breathing GI: soft, nontender, nondistended, + BS MS: no deformity or atrophy  Skin: warm and dry, no rash Neuro:  Alert and Oriented x 3, Strength and sensation are intact Psych: euthymic mood, full affect  Wt Readings from Last 3 Encounters:  09/10/16 166 lb 12.8 oz (75.7 kg)  09/08/15 167 lb 12.8 oz (76.1 kg)  08/26/14 164 lb 12.8 oz (74.8 kg)      Studies/Labs Reviewed:   EKG:  EKG is ordered today.  The ekg ordered today demonstrates 09/10/16-heart rate 61 bpm PR interval 234 ms, mild first-degree AV block, incomplete right bundle branch block, nonspecific ST-T wave changes.  Recent Labs: No results found for requested labs within last 8760 hours.   Lipid Panel No results found for: CHOL, TRIG, HDL, CHOLHDL, VLDL, LDLCALC, LDLDIRECT  Additional studies/ records that were reviewed today include:  Prior EKGs, office note, lab work reviewed    ASSESSMENT:    1. Cardiac murmur   2. Hyperaldosteronism (HCC)   3. Benign hypertensive heart disease without heart failure      PLAN:  In order of problems listed above:  Essential hypertension  - Spironolactone  - Dr. Patty Sermons started metoprolol 12.5 mg in 2017.  - Being careful with first degree AV block.  - Mildly elevated today. Continue to monitor.  First-degree AV block  - Watching with low-dose metoprolol. If syncope occurs or significant bradycardia, stop metoprolol. May need to consider pacemaker in that setting.  Heart murmur  - Sounds like aortic regurgitation. We will check an echocardiogram.  Medication Adjustments/Labs and Tests Ordered: Current medicines are reviewed at length with the patient today.  Concerns regarding medicines are outlined above.  Medication changes,  Labs and Tests ordered today are listed in the Patient Instructions below. Patient Instructions  Medication Instructions:  The current medical regimen is effective;  continue present plan and medications.  Testing/Procedures: Your physician has requested that you have an echocardiogram. Echocardiography is a painless test that uses sound waves to create images of your heart. It provides your doctor with information about the size and shape of your heart and how well your heart's chambers and valves are working. This procedure takes approximately one hour. There are no restrictions for this procedure.  Follow-Up: Follow  up in 1 year with Dr. Anne Fu.  You will receive a letter in the mail 2 months before you are due.  Please call us when you receive this letter to schedule your follow up appointment.  If you need a refill on your cardiac medications before your next appointment, please call your pharmacy.  Thank you for choosing [facility]!!        Signed, Donato Schultz, MD  09/10/2016 11:01 AM    [facility] [address] [place], [place], [place]  [address] Phone: [phone]; Fax: [phone]

## 2016-09-10 NOTE — Patient Instructions (Signed)

## 2016-10-03 ENCOUNTER — Ambulatory Visit (HOSPITAL_COMMUNITY): Payer: Medicare Other | Attending: Internal Medicine

## 2016-10-03 DIAGNOSIS — I351 Nonrheumatic aortic (valve) insufficiency: Secondary | ICD-10-CM | POA: Insufficient documentation

## 2016-10-03 DIAGNOSIS — R160 Hepatomegaly, not elsewhere classified: Secondary | ICD-10-CM | POA: Insufficient documentation

## 2016-10-03 DIAGNOSIS — R011 Cardiac murmur, unspecified: Secondary | ICD-10-CM | POA: Diagnosis present

## 2016-10-03 DIAGNOSIS — I517 Cardiomegaly: Secondary | ICD-10-CM | POA: Insufficient documentation

## 2016-10-08 ENCOUNTER — Other Ambulatory Visit: Payer: Self-pay | Admitting: Cardiology

## 2016-10-08 MED ORDER — METOPROLOL SUCCINATE ER 25 MG PO TB24: 12.5000 mg | ORAL_TABLET | Freq: Every day | ORAL | 9 refills | Status: DC

## 2016-10-16 ENCOUNTER — Other Ambulatory Visit: Payer: Self-pay | Admitting: Endocrinology

## 2016-10-16 DIAGNOSIS — R160 Hepatomegaly, not elsewhere classified: Secondary | ICD-10-CM

## 2016-10-23 ENCOUNTER — Ambulatory Visit
Admission: RE | Admit: 2016-10-23 | Discharge: 2016-10-23 | Disposition: A | Discharge status: Home / Self Care | Payer: Medicare Other | Source: Ambulatory Visit | Attending: Endocrinology | Admitting: Endocrinology

## 2016-10-23 ENCOUNTER — Inpatient Hospital Stay
Admission: RE | Admit: 2016-10-23 | Discharge: 2016-10-23 | Disposition: A | Discharge status: Home / Self Care | Payer: Medicare Other | Source: Ambulatory Visit | Attending: Endocrinology | Admitting: Endocrinology

## 2016-10-23 DIAGNOSIS — R160 Hepatomegaly, not elsewhere classified: Secondary | ICD-10-CM

## 2016-10-23 MED ORDER — IOPAMIDOL (ISOVUE-300) INJECTION 61%
100.0000 mL | Freq: Once | INTRAVENOUS | Status: AC | PRN
  Administered 2016-10-23: 100 mL via INTRAVENOUS

## 2016-10-25 ENCOUNTER — Telehealth: Payer: Self-pay | Admitting: Cardiology

## 2016-10-25 NOTE — Telephone Encounter (Signed)
Patient is returning call in regards to results. Thanks.

## 2016-10-26 NOTE — Telephone Encounter (Signed)
Reviewed the results of recent echocardiogram with pt.  He is aware to repeat in 1 yr and to contact us if he should develop any SOB.

## 2016-11-06 ENCOUNTER — Other Ambulatory Visit: Payer: Self-pay | Admitting: Cardiology

## 2017-07-08 ENCOUNTER — Other Ambulatory Visit: Payer: Self-pay

## 2017-07-08 MED ORDER — METOPROLOL SUCCINATE ER 25 MG PO TB24: 12.5000 mg | ORAL_TABLET | Freq: Every day | ORAL | 0 refills | Status: DC

## 2017-07-26 ENCOUNTER — Other Ambulatory Visit: Payer: Self-pay | Admitting: Cardiology

## 2017-09-10 ENCOUNTER — Encounter: Payer: Self-pay | Admitting: Cardiology

## 2017-09-10 ENCOUNTER — Ambulatory Visit (INDEPENDENT_AMBULATORY_CARE_PROVIDER_SITE_OTHER): Payer: Medicare Other | Admitting: Cardiology

## 2017-09-10 VITALS — BP 162/70 | HR 66 | Ht 68.0 in | Wt 164.4 lb

## 2017-09-10 DIAGNOSIS — R4781 Slurred speech: Secondary | ICD-10-CM

## 2017-09-10 DIAGNOSIS — I351 Nonrheumatic aortic (valve) insufficiency: Secondary | ICD-10-CM | POA: Diagnosis not present

## 2017-09-10 DIAGNOSIS — E269 Hyperaldosteronism, unspecified: Secondary | ICD-10-CM | POA: Diagnosis not present

## 2017-09-10 NOTE — Progress Notes (Signed)
Cardiology Office Note    Date:  09/10/2017   ID:  Nicholas Hancock, DOB December 10, 1934, MRN [id number]  PCP:  Adrian Prince, MD  Cardiologist:   Donato Schultz, MD     History of Present Illness:  Nicholas Hancock is a 83 y.o. male former patient of Dr. Yevonne Pax here for one-year follow-up, hypertension. In the past, hypertension and responded well to spironolactone. Sized block several miles a day. Dr. Ardyth Harps is his internist. Manages his lab work.  In the past he had first-degree AV block with premature atrial complexes.  Dr. Patty Sermons placed him on metoprolol succinate 12.5 mg a day to help with PACs and blood pressure.  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 This is why he is on the spironolactone.  He still walks 2 miles a day 7 days a week with his wife. No chest pain, no syncope, no orthopnea, no PND. He worked for [employer] for several years.  09/10/17 ASA 81 was started by Dr. Evlyn Kanner after slurring of speech noted on phone. Could have had a beer. No further slurring. Still walking dalily. One day leaning to right. 6 weeks ago.    Past Medical History:  Diagnosis Date  . 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 Arthritis    hands  . Elevated PSA    monitored by Dr. Earlene Plater  . Hypertension    takes diuretic for adrenal gland dysfunction, BP will be high without diuretic  . Vitamin D deficiency     Past Surgical History:  Procedure Laterality Date  . HAND SURGERY  09/21/1987   Enlarged mass on right palm overlying right long finger metacarpophalangeal joint removed  . INGUINAL HERNIA REPAIR  07/15/2012   Procedure: LAPAROSCOPIC BILATERAL INGUINAL HERNIA REPAIR;  Surgeon: Valarie Merino, MD;  Location: [facility];  Service: General;  Laterality: Bilateral;  Laparoscopic Bilateral Inguinal Hernia Repairs with Mesh  . left knee arthroscopy  3 years ago  . PELVIC FRACTURE SURGERY     chippped in high school  . TONSILLECTOMY AND ADENOIDECTOMY      Current Medications: Outpatient Medications Prior to Visit  Medication Sig Dispense Refill  . Astaxanthin 4 MG CAPS Take 4 mg by mouth daily.    . Calcium 167 MG CAPS Take 1 capsule by mouth daily.    . Cholecalciferol (VITAMIN D-3) 5000 UNITS TABS Take 1 tablet by mouth every other day.    . Flaxseed, Linseed, (FLAX SEEDS PO) Take 1 tablet by mouth daily.    . metoprolol succinate (TOPROL XL) 25 MG 24 hr tablet Take 0.5 tablets (12.5 mg total) by mouth daily. 90 tablet 0  . Multiple Vitamin (MULTIVITAMIN WITH MINERALS) TABS Take 1 tablet by mouth daily.    . Omega-3 Fatty Acids (SALMON OIL-1000 PO) Take 1,000 mg by mouth daily.    Marland Kitchen POLICOSANOL PO Take 20 mg by mouth daily.    . Saw Palmetto, Serenoa repens, 320 MG CAPS Take 1 tablet by mouth daily.    Marland Kitchen spironolactone (ALDACTONE) 50 MG tablet TAKE 1 TABLET BY MOUTH EVERY DAY BEFORE BREAKFAST 30 tablet 0  . vitamin B-12 (CYANOCOBALAMIN) 1000 MCG tablet Take 1,000 mcg by mouth daily.    . vitamin C (ASCORBIC ACID) 500 MG tablet Take 500 mg by mouth daily.     No facility-administered medications prior to visit.      Allergies:   Patient has  no known allergies.   Social History   Socioeconomic History  . Marital status: Married    Spouse name: None  . Number of children: None  . Years of education: None  . Highest education level: None  Social Needs  . Financial resource strain: None  . Food insecurity - worry: None  . Food insecurity - inability: None  . Transportation needs - medical: None  . Transportation needs - non-medical: None  Occupational History  . None  Tobacco Use  . Smoking status: Former Smoker    Years: 20.00    Types: Pipe    Last attempt to quit: 05/20/1985    Years since quitting: 32.3  . Smokeless tobacco: Never Used  Substance and Sexual Activity  . Alcohol use: Yes    Comment: approx 4 beers a week  . Drug use: No  . Sexual  activity: None  Other Topics Concern  . None  Social History Narrative  . None     Family History:  The patient's family history includes Cancer in his father and mother; Diabetes in his father; Heart disease in his mother; Stroke in his father.   ROS:   Please see the history of present illness.    Review of Systems  All other systems reviewed and are negative.     PHYSICAL EXAM:   VS:  BP (!) 162/70   Pulse 66   Ht 5\' 8"  (1.727 m)   Wt 164 lb 6.4 oz (74.6 kg)   BMI 25.00 kg/m    GEN: Well nourished, well developed, in no acute distress  HEENT: normal  Neck: no JVD, carotid bruits, or masses Cardiac: RRR; 2/6 diastolic murmur right upper sternal border, no rubs, or gallops,no edema  Respiratory:  clear to auscultation bilaterally, normal work of breathing GI: soft, nontender, nondistended, + BS MS: no deformity or atrophy  Skin: warm and dry, no rash Neuro:  Alert and Oriented x 3, Strength and sensation are intact Psych: euthymic mood, full affect  Wt Readings from Last 3 Encounters:  09/10/17 164 lb 6.4 oz (74.6 kg)  09/10/16 166 lb 12.8 oz (75.7 kg)  09/08/15 167 lb 12.8 oz (76.1 kg)      Studies/Labs Reviewed:   EKG:  EKG is ordered today.  The ekg ordered today demonstrates 09/10/16-heart rate 61 bpm PR interval 234 ms, mild first-degree AV block, incomplete right bundle branch block, nonspecific ST-T wave changes.  Recent Labs: No results found for requested labs within last 8760 hours.   Lipid Panel No results found for: CHOL, TRIG, HDL, CHOLHDL, VLDL, LDLCALC, LDLDIRECT  Additional studies/ records that were reviewed today include:  Prior EKGs, office note, lab work reviewed  ECHO 10/03/16: - Left ventricle: The cavity size was normal. There was moderate   concentric hypertrophy. Systolic function was normal. Wall motion   was normal; there were no regional wall motion abnormalities.   Doppler parameters are consistent with abnormal left ventricular    relaxation (grade 1 diastolic dysfunction). Doppler parameters   are consistent with indeterminate ventricular filling pressure. - Aortic valve: Transvalvular velocity was within the normal range.   There was no stenosis. There was moderate to severe   regurgitation. - Mitral valve: Transvalvular velocity was within the normal range.   There was no evidence for stenosis. There was trivial   regurgitation. - Left atrium: The atrium was moderately dilated. - Right ventricle: The cavity size was normal. Wall thickness was   normal. Systolic function was  normal. - Atrial septum: No defect or patent foramen ovale was identified   by color flow Doppler. - Tricuspid valve: There was mild regurgitation. - Pulmonary arteries: Systolic pressure was within the normal   range. PA peak pressure: 31 mm Hg (S).  CT of abdomen: no liver cyst  ASSESSMENT:    1. Aortic valve insufficiency, etiology of cardiac valve disease unspecified   2. Slurred speech   3. Hyperaldosteronism (HCC)      PLAN:  In order of problems listed above:  Aortic regurgitation -Moderate to severe, normal left ventricular ejection fraction, asymptomatic.  Walking daily without any shortness of breath.  We will repeat echocardiogram in late February 2019. -If symptoms do occur, it may be time for aortic valve replacement.  We need to watch for any reduction in his ejection fraction or dilation of his left ventricle.  Slurred speech transiently, transient leaning to the right when walking, question TIA - We will go ahead and refer him to neurology.  Continue with low-dose aspirin.  Essential hypertension  - Spironolactone  - Dr. Patty Sermons started metoprolol 12.5 mg in 2017.  - Being careful with first degree AV block.  - Mildly elevated today. Continue to monitor.  No changes.  First-degree AV block  - Watching with low-dose metoprolol. If syncope occurs or significant bradycardia, stop metoprolol. May need to consider  pacemaker in that setting. Stable   Medication Adjustments/Labs and Tests Ordered: Current medicines are reviewed at length with the patient today.  Concerns regarding medicines are outlined above.  Medication changes, Labs and Tests ordered today are listed in the Patient Instructions below. Patient Instructions  Medication Instructions:  The current medical regimen is effective;  continue present plan and medications.  Labwork: None  Testing/Procedures: Your physician has requested that you have an echocardiogram (after 10/03/17). Echocardiography is a painless test that uses sound waves to create images of your heart. It provides your doctor with information about the size and shape of your heart and how well your heart's chambers and valves are working. This procedure takes approximately one hour. There are no restrictions for this procedure.  You have been referred to Neurology for the evaluation of possible TIA.  Follow-Up: Follow up in 6 months with Dr. Anne Fu.  You will receive a letter in the mail 2 months before you are due.  Please call us when you receive this letter to schedule your follow up appointment.  If you need a refill on your cardiac medications before your next appointment, please call your pharmacy.  Thank you for choosing [facility]!!    Transient Ischemic Attack A transient ischemic attack (TIA) is a "warning stroke" that causes stroke-like symptoms. A TIA does not cause lasting damage to the brain. The symptoms of a TIA can happen fast and do not last long. It is important to know the symptoms of a TIA and what to do. This can help prevent stroke or death. Follow these instructions at home:  Take medicines only as told by your doctor. Make sure you understand all of the instructions.  You may need to take aspirin or warfarin medicine. Warfarin needs to be taken exactly as told. ? Taking too much or too little warfarin is dangerous. Blood tests  must be done as often as told by your doctor. A PT blood test measures how long it takes for blood to clot. Your PT is used to calculate another value called an INR. Your PT and INR help your  doctor adjust your warfarin dosage. He or she will make sure you are taking the right amount. ? Food can cause problems with warfarin and affect the results of your blood tests. This is true for foods high in vitamin K. Eat the same amount of foods high in vitamin K each day. Foods high in vitamin K include spinach, kale, broccoli, cabbage, collard and turnip greens, Brussels sprouts, peas, cauliflower, seaweed, and parsley. Other foods high in vitamin K include beef and pork liver, green tea, and soybean oil. Eat the same amount of foods high in vitamin K each day. Avoid big changes in your diet. Tell your doctor before changing your diet. Talk to a food specialist (dietitian) if you have questions. ? Many medicines can cause problems with warfarin and affect your PT and INR. Tell your doctor about all medicines you take. This includes vitamins and dietary pills (supplements). Do not take or stop taking any prescribed or over-the-counter medicines unless your doctor tells you to. ? Warfarin can cause more bruising or bleeding. Hold pressure over any cuts for longer than normal. Talk to your doctor about other side effects of warfarin. ? Avoid sports or activities that may cause injury or bleeding. ? Be careful when you shave, floss, or use sharp objects. ? Avoid or drink very little alcohol while taking warfarin. Tell your doctor if you change how much alcohol you drink. ? Tell your dentist and other doctors that you take warfarin before any procedures.  Follow your diet program as told, if you are given one.  Keep a healthy weight.  Stay active. Try to get at least 30 minutes of activity on all or most days.  Do not use any tobacco products, including cigarettes, chewing tobacco, or electronic cigarettes. If  you need help quitting, ask your doctor.  Limit alcohol intake to no more than 1 drink per day for nonpregnant women and 2 drinks per day for men. One drink equals 12 ounces of beer, 5 ounces of wine, or 1 ounces of hard liquor.  Do not abuse drugs.  Keep your home safe so you do not fall. You can do this by: ? Putting grab bars in the bedroom and bathroom. ? Raising toilet seats. ? Putting a seat in the shower.  Keep all follow-up visits as told by your doctor. This is important. Contact a doctor if:  Your personality changes.  You have trouble swallowing.  You have double vision.  You are dizzy.  You have a fever. Get help right away if: These symptoms may be an emergency. Do not wait to see if the symptoms will go away. Get medical help right away. Call your local emergency services (911 in the [place] Do not drive yourself to the hospital.  You have sudden weakness or lose feeling (go numb), especially on one side of the body. This can affect your: ? Face. ? Arm. ? Leg.  You have sudden trouble walking.  You have sudden trouble moving your arms or legs.  You have sudden confusion.  You have trouble talking.  You have trouble understanding.  You have sudden trouble seeing in one or both eyes.  You lose your balance.  Your movements are not smooth.  You have a sudden, very bad headache with no known cause.  You have new chest pain.  Your heartbeat is unsteady.  You are partly or totally unaware of what is going on around you.  This information is not intended to replace advice  given to you by your health care provider. Make sure you discuss any questions you have with your health care provider. Document Released: 05/08/2008 Document Revised: 04/02/2016 Document Reviewed: 11/04/2013 Elsevier Interactive Patient Education  [employer].      Signed, Donato Schultz, MD  09/10/2017 9:41 AM    [facility] [address] [place],  [place], [place]  [address] Phone: [phone]; Fax: [phone]

## 2017-09-10 NOTE — Patient Instructions (Signed)
Medication Instructions:  The current medical regimen is effective;  continue present plan and medications.  Labwork: None  Testing/Procedures: Your physician has requested that you have an echocardiogram (after 10/03/17). Echocardiography is a painless test that uses sound waves to create images of your heart. It provides your doctor with information about the size and shape of your heart and how well your heart's chambers and valves are working. This procedure takes approximately one hour. There are no restrictions for this procedure.  You have been referred to Neurology for the evaluation of possible TIA.  Follow-Up: Follow up in 6 months with Dr. Anne Fu.  You will receive a letter in the mail 2 months before you are due.  Please call us when you receive this letter to schedule your follow up appointment.  If you need a refill on your cardiac medications before your next appointment, please call your pharmacy.  Thank you for choosing [facility]!!    Transient Ischemic Attack A transient ischemic attack (TIA) is a "warning stroke" that causes stroke-like symptoms. A TIA does not cause lasting damage to the brain. The symptoms of a TIA can happen fast and do not last long. It is important to know the symptoms of a TIA and what to do. This can help prevent stroke or death. Follow these instructions at home:  Take medicines only as told by your doctor. Make sure you understand all of the instructions.  You may need to take aspirin or warfarin medicine. Warfarin needs to be taken exactly as told. ? Taking too much or too little warfarin is dangerous. Blood tests must be done as often as told by your doctor. A PT blood test measures how long it takes for blood to clot. Your PT is used to calculate another value called an INR. Your PT and INR help your doctor adjust your warfarin dosage. He or she will make sure you are taking the right amount. ? Food can cause problems with  warfarin and affect the results of your blood tests. This is true for foods high in vitamin K. Eat the same amount of foods high in vitamin K each day. Foods high in vitamin K include spinach, kale, broccoli, cabbage, collard and turnip greens, Brussels sprouts, peas, cauliflower, seaweed, and parsley. Other foods high in vitamin K include beef and pork liver, green tea, and soybean oil. Eat the same amount of foods high in vitamin K each day. Avoid big changes in your diet. Tell your doctor before changing your diet. Talk to a food specialist (dietitian) if you have questions. ? Many medicines can cause problems with warfarin and affect your PT and INR. Tell your doctor about all medicines you take. This includes vitamins and dietary pills (supplements). Do not take or stop taking any prescribed or over-the-counter medicines unless your doctor tells you to. ? Warfarin can cause more bruising or bleeding. Hold pressure over any cuts for longer than normal. Talk to your doctor about other side effects of warfarin. ? Avoid sports or activities that may cause injury or bleeding. ? Be careful when you shave, floss, or use sharp objects. ? Avoid or drink very little alcohol while taking warfarin. Tell your doctor if you change how much alcohol you drink. ? Tell your dentist and other doctors that you take warfarin before any procedures.  Follow your diet program as told, if you are given one.  Keep a healthy weight.  Stay active. Try to get at least 30 minutes  of activity on all or most days.  Do not use any tobacco products, including cigarettes, chewing tobacco, or electronic cigarettes. If you need help quitting, ask your doctor.  Limit alcohol intake to no more than 1 drink per day for nonpregnant women and 2 drinks per day for men. One drink equals 12 ounces of beer, 5 ounces of wine, or 1 ounces of hard liquor.  Do not abuse drugs.  Keep your home safe so you do not fall. You can do this  by: ? Putting grab bars in the bedroom and bathroom. ? Raising toilet seats. ? Putting a seat in the shower.  Keep all follow-up visits as told by your doctor. This is important. Contact a doctor if:  Your personality changes.  You have trouble swallowing.  You have double vision.  You are dizzy.  You have a fever. Get help right away if: These symptoms may be an emergency. Do not wait to see if the symptoms will go away. Get medical help right away. Call your local emergency services (911 in the [place] Do not drive yourself to the hospital.  You have sudden weakness or lose feeling (go numb), especially on one side of the body. This can affect your: ? Face. ? Arm. ? Leg.  You have sudden trouble walking.  You have sudden trouble moving your arms or legs.  You have sudden confusion.  You have trouble talking.  You have trouble understanding.  You have sudden trouble seeing in one or both eyes.  You lose your balance.  Your movements are not smooth.  You have a sudden, very bad headache with no known cause.  You have new chest pain.  Your heartbeat is unsteady.  You are partly or totally unaware of what is going on around you.  This information is not intended to replace advice given to you by your health care provider. Make sure you discuss any questions you have with your health care provider. Document Released: 05/08/2008 Document Revised: 04/02/2016 Document Reviewed: 11/04/2013 Elsevier Interactive Patient Education  [employer].

## 2017-09-11 ENCOUNTER — Encounter: Payer: Self-pay | Admitting: Neurology

## 2017-09-15 ENCOUNTER — Other Ambulatory Visit: Payer: Self-pay | Admitting: Cardiology

## 2017-09-16 ENCOUNTER — Other Ambulatory Visit (HOSPITAL_COMMUNITY): Payer: Medicare Other

## 2017-10-04 ENCOUNTER — Ambulatory Visit (HOSPITAL_COMMUNITY): Payer: Medicare Other | Attending: Cardiovascular Disease

## 2017-10-04 ENCOUNTER — Other Ambulatory Visit: Payer: Self-pay

## 2017-10-04 DIAGNOSIS — I429 Cardiomyopathy, unspecified: Secondary | ICD-10-CM | POA: Insufficient documentation

## 2017-10-04 DIAGNOSIS — I119 Hypertensive heart disease without heart failure: Secondary | ICD-10-CM | POA: Insufficient documentation

## 2017-10-04 DIAGNOSIS — R9431 Abnormal electrocardiogram [ECG] [EKG]: Secondary | ICD-10-CM | POA: Diagnosis present

## 2017-10-04 DIAGNOSIS — I351 Nonrheumatic aortic (valve) insufficiency: Secondary | ICD-10-CM | POA: Diagnosis not present

## 2017-11-03 ENCOUNTER — Other Ambulatory Visit: Payer: Self-pay | Admitting: Cardiology

## 2017-12-12 ENCOUNTER — Telehealth: Payer: Self-pay | Admitting: Cardiology

## 2017-12-12 DIAGNOSIS — I499 Cardiac arrhythmia, unspecified: Secondary | ICD-10-CM

## 2017-12-12 DIAGNOSIS — R4781 Slurred speech: Secondary | ICD-10-CM

## 2017-12-12 DIAGNOSIS — R0602 Shortness of breath: Secondary | ICD-10-CM

## 2017-12-12 NOTE — Telephone Encounter (Signed)
New Message   Dr. Warrick Parisian with [facility] is requesting a call back to discuss patient. He did not say what it was in reference to but would like a call back as soon as possible.

## 2017-12-12 NOTE — Telephone Encounter (Signed)
Reviewed with Dr Anne Fu who gave verbal orders for 30 day heart monitor.  Spoke with wife who is aware someone will be contacting them to schedule placement of the monitor.  She states understanding and was very appreciative of the call.

## 2017-12-12 NOTE — Telephone Encounter (Signed)
Okay to order event monitor - palps Donato Schultz, MD

## 2017-12-12 NOTE — Telephone Encounter (Signed)
Spoke with Dr Clent Ridges who was evaluating pt for possible colonoscopy d/t hx of polyps.  In listening to his heart, Dr Clent Ridges noted a chaotic heart rhythm.  He is concerned patient is going in and out of At fib.  Pt has been have recent events of slurred speech, slowing cognition as noted by his family and having intermittently to stop to breathe with walking.  He has been scheduled for neuro consult and is scheduled in a few weeks d/t possible TIA.  When Dr Clent Ridges listened to pt's heart again it sounded back to his baseline NSR with PACs.  Pt does not feel (in his chest) when these episodes occur.  Dr Clent Ridges would like Dr Anne Fu to be made aware of these findings so that further evaluation can be planned - possible (holter vs event monitor).  Will forward to Dr Anne Fu for review and orders

## 2017-12-24 ENCOUNTER — Other Ambulatory Visit: Payer: Self-pay | Admitting: Cardiology

## 2017-12-24 ENCOUNTER — Ambulatory Visit (INDEPENDENT_AMBULATORY_CARE_PROVIDER_SITE_OTHER): Payer: Medicare Other

## 2017-12-24 DIAGNOSIS — I441 Atrioventricular block, second degree: Secondary | ICD-10-CM | POA: Diagnosis not present

## 2017-12-24 DIAGNOSIS — R4781 Slurred speech: Secondary | ICD-10-CM | POA: Diagnosis not present

## 2017-12-24 DIAGNOSIS — R0602 Shortness of breath: Secondary | ICD-10-CM

## 2017-12-24 DIAGNOSIS — I499 Cardiac arrhythmia, unspecified: Secondary | ICD-10-CM

## 2017-12-24 DIAGNOSIS — I491 Atrial premature depolarization: Secondary | ICD-10-CM

## 2017-12-26 ENCOUNTER — Ambulatory Visit (INDEPENDENT_AMBULATORY_CARE_PROVIDER_SITE_OTHER): Payer: Medicare Other | Admitting: Neurology

## 2017-12-26 ENCOUNTER — Other Ambulatory Visit: Payer: Medicare Other

## 2017-12-26 ENCOUNTER — Encounter

## 2017-12-26 ENCOUNTER — Encounter: Payer: Self-pay | Admitting: Neurology

## 2017-12-26 VITALS — BP 128/68 | HR 69 | Ht 68.0 in | Wt 164.0 lb

## 2017-12-26 DIAGNOSIS — Z13220 Encounter for screening for lipoid disorders: Secondary | ICD-10-CM | POA: Diagnosis not present

## 2017-12-26 DIAGNOSIS — I639 Cerebral infarction, unspecified: Secondary | ICD-10-CM

## 2017-12-26 DIAGNOSIS — G4761 Periodic limb movement disorder: Secondary | ICD-10-CM

## 2017-12-26 NOTE — Progress Notes (Signed)
NEUROLOGY CONSULTATION NOTE  Nicholas Hancock MRN: [id number] DOB: June 07, 1935  Referring provider: Donato Schultz, MD Primary care provider: Adrian Prince, MD  Reason for consult:  Stroke  HISTORY OF PRESENT ILLNESS: Nicholas Hancock is an 83 year old right-handed male with hypertension, aortic valve insufficiency, benign hypertensive heart disease, hyperaldosteronism, BPH and former smoker who presents for slurred speech.  He is accompanied by his wife who supplements history.  In February, he was noted by family to be speaking more slowly and mildly confused.  He would ask questions about things he should already know the answer.  It was also noted that he seemed to be leaning to the right when he walked.  Symptoms lasted a week.  There is questionable facial asymmetry as her family.  He denied headache, visual disturbance, or dizziness.  Since then, he seems to drop objects such as coffee cups more frequently.Marland Kitchen  His PCP told him to start ASA 81mg  daily.  10/04/16 Echocardiogram:  LV EF 55%-60%, grade 1 diastolic dysfunction, moderate aortic regurgitation Cardiac event monitor was recently placed.  Stroke risk factors include hypertension, heart disease and former smoker.  He previously took medication for high cholesterol.  His wife also notes that for decades, his right arm and leg would shake, lasting 20 seconds at a time.  There is no foaming of the mouth or incontinence.  It seems to be getting worse over time, maybe 4 times a night.  He has no known history of seizures.  PAST MEDICAL HISTORY: Past Medical History:  Diagnosis Date  . 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 Arthritis    hands  . Elevated PSA    monitored by Dr. Earlene Plater  . Hypertension    takes diuretic for adrenal gland dysfunction, BP will be high without diuretic  . Vitamin D deficiency     PAST SURGICAL HISTORY: Past Surgical History:    Procedure Laterality Date  . HAND SURGERY  09/21/1987   Enlarged mass on right palm overlying right long finger metacarpophalangeal joint removed  . INGUINAL HERNIA REPAIR  07/15/2012   Procedure: LAPAROSCOPIC BILATERAL INGUINAL HERNIA REPAIR;  Surgeon: Valarie Merino, MD;  Location: [facility];  Service: General;  Laterality: Bilateral;  Laparoscopic Bilateral Inguinal Hernia Repairs with Mesh  . left knee arthroscopy  3 years ago  . PELVIC FRACTURE SURGERY     chippped in high school  . TONSILLECTOMY AND ADENOIDECTOMY      MEDICATIONS: Current Outpatient Medications on File Prior to Visit  Medication Sig Dispense Refill  . aspirin EC 81 MG tablet Take 81 mg by mouth daily.    . Astaxanthin 4 MG CAPS Take 4 mg by mouth daily.    . Calcium 167 MG CAPS Take 1 capsule by mouth daily.    . Cholecalciferol (VITAMIN D-3) 5000 UNITS TABS Take 1 tablet by mouth every other day.    . Flaxseed, Linseed, (FLAX SEEDS PO) Take 1 tablet by mouth daily.    . metoprolol succinate (TOPROL-XL) 25 MG 24 hr tablet TAKE 0.5 TABLETS (12.5 MG TOTAL) BY MOUTH DAILY. 45 tablet 2  . Multiple Vitamin (MULTIVITAMIN WITH MINERALS) TABS Take 1 tablet by mouth daily.    . Omega-3 Fatty Acids (SALMON OIL-1000 PO) Take 1,000 mg by mouth daily.    Marland Kitchen POLICOSANOL PO Take 20 mg by mouth daily.    . Saw Palmetto, Serenoa repens, 320 MG CAPS Take 1  tablet by mouth daily.    Marland Kitchen spironolactone (ALDACTONE) 50 MG tablet TAKE 1 TABLET BY MOUTH EVERY DAY BEFORE BREAKFAST 90 tablet 3  . vitamin B-12 (CYANOCOBALAMIN) 1000 MCG tablet Take 1,000 mcg by mouth daily.    . vitamin C (ASCORBIC ACID) 500 MG tablet Take 500 mg by mouth daily.     No current facility-administered medications on file prior to visit.     ALLERGIES: No Known Allergies  FAMILY HISTORY: Family History  Problem Relation Age of Onset  . Stroke Father   . Diabetes Father        `  . Cancer Father        unknown  . Heart disease Mother   . Cancer Mother         unknown    SOCIAL HISTORY: Social History   Socioeconomic History  . Marital status: Married    Spouse name: Nicholas Hancock  . Number of children: 4  . Years of education: Not on file  . Highest education level: Master's degree (e.g., MA, MS, MEng, MEd, MSW, MBA)  Occupational History  . Occupation: retired  Engineer, production  . Financial resource strain: Not on file  . Food insecurity:    Worry: Not on file    Inability: Not on file  . Transportation needs:    Medical: Not on file    Non-medical: Not on file  Tobacco Use  . Smoking status: Former Smoker    Years: 20.00    Types: Pipe    Last attempt to quit: 05/20/1985    Years since quitting: 32.6  . Smokeless tobacco: Never Used  Substance and Sexual Activity  . Alcohol use: Yes    Comment: approx 4 beers a week  . Drug use: No  . Sexual activity: Not on file  Lifestyle  . Physical activity:    Days per week: Not on file    Minutes per session: Not on file  . Stress: Not on file  Relationships  . Social connections:    Talks on phone: Not on file    Gets together: Not on file    Attends religious service: Not on file    Active member of club or organization: Not on file    Attends meetings of clubs or organizations: Not on file    Relationship status: Not on file  . Intimate partner violence:    Fear of current or ex partner: Not on file    Emotionally abused: Not on file    Physically abused: Not on file    Forced sexual activity: Not on file  Other Topics Concern  . Not on file  Social History Narrative   Patient is right-handed. He lives with his wife, Nicholas Hancock, in a one story home. He drinks 2-3 cups of coffee that is 1/3 caffeine. He walks 2 miles, 7 days a week. Prior to retirement, he worked in Technical brewer for [employer].     REVIEW OF SYSTEMS: Constitutional: No fevers, chills, or sweats, no generalized fatigue, change in appetite Eyes: No visual changes, double vision, eye pain Ear, nose and throat: No hearing  loss, ear pain, nasal congestion, sore throat Cardiovascular: No chest pain, palpitations Respiratory:  No shortness of breath at rest or with exertion, wheezes GastrointestinaI: No nausea, vomiting, diarrhea, abdominal pain, fecal incontinence Genitourinary:  No dysuria, urinary retention or frequency Musculoskeletal:  No neck pain, back pain Integumentary: No rash, pruritus, skin lesions Neurological: as above Psychiatric: No depression, insomnia, anxiety Endocrine:  No palpitations, fatigue, diaphoresis, mood swings, change in appetite, change in weight, increased thirst Hematologic/Lymphatic:  No purpura, petechiae. Allergic/Immunologic: no itchy/runny eyes, nasal congestion, recent allergic reactions, rashes  PHYSICAL EXAM: Vitals:   12/26/17 0949  BP: 128/68  Pulse: 69  SpO2: 98%   General: No acute distress.  Patient appears well-groomed.   Head:  Normocephalic/atraumatic Eyes:  fundi examined but not visualized Neck: supple, no paraspinal tenderness, full range of motion Back: No paraspinal tenderness Heart: regular rate and rhythm Lungs: Clear to auscultation bilaterally. Vascular: No carotid bruits. Neurological Exam: Mental status: alert and oriented to person, place, and time, recent and remote memory intact, fund of knowledge intact, attention and concentration intact, speech fluent and not dysarthric, language intact. Cranial nerves: CN I: not tested CN II: pupils equal, round and reactive to light, visual fields intact CN III, IV, VI:  full range of motion, no nystagmus, no ptosis CN V: facial sensation intact CN VII: upper and lower face symmetric CN VIII: hearing intact CN IX, X: gag intact, uvula midline CN XI: sternocleidomastoid and trapezius muscles intact CN XII: tongue midline Bulk & Tone: normal, no fasciculations. Motor:  5/5 throughout  Sensation:  Pinprick sensation intact and vibration sensation reduced in feet.. Deep Tendon Reflexes:  2+  throughout, toes equivocal Finger to nose testing:  Without dysmetria.  Heel to shin:  Without dysmetria.  Gait:  Stooped posture with short stride. Romberg negative.  IMPRESSION: Possible stroke.  I would treat for secondary stroke prevention. 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  Continue ASA 81mg  daily for secondary stroke prevention 2.  Check MRI of brain, carotid doppler and lipid panel. 3.  Check EEG 4.  Follow up in 4 months.  Thank you for allowing me to take part in the care of this patient.  Shon Millet, DO  CC:  Nicholas Prince, MD  Nicholas Schultz, MD

## 2017-12-26 NOTE — Patient Instructions (Addendum)
I think a stroke is probable, so I would treat for stroke.  1.  Continue aspirin 81mg  daily  2.  We will check carotid doppler to look at arteries in neck. We have sent a referral to [facility] for your Doppler and they will call you directly to schedule your appt. If you need to contact them directly please call [phone].  3.  We will check a lipid panel to look at cholesterol.  Expect to be started on a statin medication. Your provider has requested that you have labwork completed today. Please go to [facility] ([address]) on the second floor of this building before leaving the office today. You do not need to check in. If you are not called within 15 minutes please check with the front desk.  ` 4.  We will check MRI of brain without contrast. We have sent a referral to [facility] for your MRI and they will call you directly to schedule your appt. They are located at [address] [facility]. If you need to contact them directly please call [phone].  5.  Continue blood pressure control  6.  We will check an EEG to evaluate the sleep movements  7.  Follow up in 4 months.

## 2017-12-27 LAB — LIPID PANEL WITH LDL/HDL RATIO
Cholesterol, Total: 184 mg/dL (ref 100–199)
HDL: 27 mg/dL — AB (ref >39)
LDL CALC: 129 mg/dL — AB (ref 0–99)
LDL/HDL RATIO: 4.8 ratio — AB (ref 0.0–3.6)
TRIGLYCERIDES: 142 mg/dL (ref 0–149)
VLDL CHOLESTEROL CAL: 28 mg/dL (ref 5–40)

## 2018-01-01 ENCOUNTER — Telehealth: Payer: Self-pay | Admitting: Neurology

## 2018-01-01 ENCOUNTER — Ambulatory Visit (INDEPENDENT_AMBULATORY_CARE_PROVIDER_SITE_OTHER): Payer: Medicare Other | Admitting: Neurology

## 2018-01-01 DIAGNOSIS — G4761 Periodic limb movement disorder: Secondary | ICD-10-CM | POA: Diagnosis not present

## 2018-01-01 NOTE — Telephone Encounter (Signed)
Patient wife called and wants to let know she has not heard from anyone about the two referrals we were to send out  One was for a MRI and one was for Carotid doppler.

## 2018-01-01 NOTE — Telephone Encounter (Signed)
jaffe patient. 

## 2018-01-01 NOTE — Telephone Encounter (Signed)
Called and spoke with wife, reminded her the contact information to [facility] was on her AVS and we went over that before she left. She located the information, and will call to schedule

## 2018-01-02 NOTE — Procedures (Signed)
ELECTROENCEPHALOGRAM REPORT  Date of Study: 01/01/2018  Patient's Name: Nicholas Hancock MRN: [id number] Date of Birth: [birth year]  Referring Provider: Shon Millet, DO  Clinical History: 83 year old male with jerking movement in his sleep  Medications: aspirin EC 81 MG tablet   Astaxanthin 4 MG CAPS    Calcium 167 MG CAPS   VITAMIN D-3 5000 UNITS TABS  TOPROL-XL 25 MG 24 hr tablet    ALDACTONE 50 MG tablet  CYANOCOBALAMIN 1000 MCG tablet  ASCORBIC ACID 500 MG tablet  Technical Summary: A multichannel digital EEG recording measured by the international 10-20 system with electrodes applied with paste and impedances below 5000 ohms performed in our laboratory with EKG monitoring in an awake and asleep patient.  Hyperventilation was not performed.  Photic stimulation was performed.  The digital EEG was referentially recorded, reformatted, and digitally filtered in a variety of bipolar and referential montages for optimal display.    Description: The patient is awake and asleep during the recording.  During maximal wakefulness, there is a symmetric, medium voltage 9 Hz posterior dominant rhythm that attenuates with eye opening.  The record is symmetric.  During drowsiness and sleep, there is an increase in theta slowing of the background.  Stage 2 sleep was seen.  Photic stimulation did not elicit any abnormalities.  There were no epileptiform discharges or electrographic seizures seen.    EKG lead showed at least one PVC and intermittent irregular rhythm  Impression: This awake and sleep EEG is normal.    Clinical Correlation: A normal EEG does not exclude a clinical diagnosis of epilepsy.  If further clinical questions remain, prolonged EEG may be helpful.  Clinical correlation is advised.   Shon Millet, DO

## 2018-01-07 ENCOUNTER — Telehealth: Payer: Self-pay | Admitting: Neurology

## 2018-01-07 ENCOUNTER — Telehealth: Payer: Self-pay

## 2018-01-07 MED ORDER — ATORVASTATIN CALCIUM 40 MG PO TABS: 40.0000 mg | ORAL_TABLET | Freq: Every day | ORAL | 3 refills | Status: DC

## 2018-01-07 NOTE — Telephone Encounter (Signed)
I am unable to reach by telephone. I am sending letter.

## 2018-01-07 NOTE — Telephone Encounter (Signed)
Called [facility], spoke with [place] She is calling Pt now to schedule. Advised her the wife's name is [name].

## 2018-01-07 NOTE — Telephone Encounter (Signed)
Attempted to call Pt to ensure MRI was scheduled, x3 rcvd message the number is not in service

## 2018-01-07 NOTE — Telephone Encounter (Signed)
Pt's spouse called and wanted Dr Everlena Cooper to set up an MRI for pt

## 2018-01-07 NOTE — Telephone Encounter (Signed)
-----   Message from [name], DO sent at 12/30/2017 12:31 PM EDT ----- The LDL is 129.  I would like the level lower.  I recommend starting atorvastatin 40mg  daily and recheck lipid panel in 3 months.

## 2018-01-08 ENCOUNTER — Telehealth: Payer: Self-pay | Admitting: Neurology

## 2018-01-08 NOTE — Telephone Encounter (Signed)
Pt's spouse left a VM message saying pt has a new number, did update in the system CB# [phone]

## 2018-01-17 ENCOUNTER — Ambulatory Visit
Admission: RE | Admit: 2018-01-17 | Discharge: 2018-01-17 | Disposition: A | Discharge status: Home / Self Care | Payer: Medicare Other | Source: Ambulatory Visit | Attending: Neurology | Admitting: Neurology

## 2018-01-17 DIAGNOSIS — I639 Cerebral infarction, unspecified: Secondary | ICD-10-CM

## 2018-01-17 MED ORDER — GADOBENATE DIMEGLUMINE 529 MG/ML IV SOLN
15.0000 mL | Freq: Once | INTRAVENOUS | Status: AC | PRN
  Administered 2018-01-17: 15 mL via INTRAVENOUS

## 2018-01-20 ENCOUNTER — Ambulatory Visit
Admission: RE | Admit: 2018-01-20 | Discharge: 2018-01-20 | Disposition: A | Discharge status: Home / Self Care | Payer: Medicare Other | Source: Ambulatory Visit | Attending: Neurology | Admitting: Neurology

## 2018-01-20 DIAGNOSIS — I639 Cerebral infarction, unspecified: Secondary | ICD-10-CM

## 2018-01-21 ENCOUNTER — Telehealth: Payer: Self-pay

## 2018-01-21 ENCOUNTER — Telehealth: Payer: Self-pay | Admitting: Neurology

## 2018-01-21 NOTE — Telephone Encounter (Signed)
-----   Message from Adam R Jaffe, DO sent at 01/20/2018  8:18 AM EDT ----- I reviewed the MRI.  While it does not reveal any specific stroke to explain his symptoms, it does reveal extensive chronic changes in the brain that is often seen due to risk factors that cause stroke (such as history of high blood pressure).  No change in current management.  Continue aspirin, atorvastatin and blood pressure control.  Complete testing. 

## 2018-01-21 NOTE — Telephone Encounter (Signed)
Called Pt to give MRI results, it rang many times, then recording said call could not be completed at this time

## 2018-01-21 NOTE — Telephone Encounter (Signed)
LM for Pt to rtrn my call 

## 2018-01-21 NOTE — Telephone Encounter (Signed)
-----   Message from [name], DO sent at 01/20/2018  8:18 AM EDT ----- I reviewed the MRI.  While it does not reveal any specific stroke to explain his symptoms, it does reveal extensive chronic changes in the brain that is often seen due to risk factors that cause stroke (such as history of high blood pressure).  No change in current management.  Continue aspirin, atorvastatin and blood pressure control.  Complete testing.

## 2018-01-21 NOTE — Telephone Encounter (Signed)
Patient returned your call.  Thanks!

## 2018-01-22 ENCOUNTER — Telehealth: Payer: Self-pay | Admitting: Neurology

## 2018-01-22 NOTE — Telephone Encounter (Signed)
Retrned [name]'s call [phone], mailbox is full and cannot receive any messages

## 2018-01-22 NOTE — Telephone Encounter (Signed)
Patient's wife called regarding MRI results. Thanks

## 2018-01-22 NOTE — Telephone Encounter (Signed)
Patient returned your call. Please Call. Thanks

## 2018-01-23 NOTE — Telephone Encounter (Signed)
Nicholas Hancock rtrnd my call, advised her of MRI and carotid results, as well as recommendations.

## 2018-03-13 NOTE — Progress Notes (Signed)
Cardiology Office Note    Date:  03/14/2018   ID:  Nicholas Hancock, DOB March 27, 1935, MRN [id number]  PCP:  Adrian Prince, MD  Cardiologist:   Donato Schultz, MD     History of Present Illness:  Nicholas Hancock is a 83 y.o. male former patient of Dr. Yevonne Pax here for one-year follow-up, hypertension. In the past, hypertension and responded well to spironolactone. Sized block several miles a day. Dr. Ardyth Harps is his internist. Manages his lab work.  In the past he had first-degree AV block with premature atrial complexes.  Dr. Patty Sermons placed him on metoprolol succinate 12.5 mg a day to help with PACs and blood pressure.  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 This is why he is on the spironolactone.  He still walks 2 miles a day 7 days a week with his wife. No chest pain, no syncope, no orthopnea, no PND. He worked for [employer] for several years.  09/10/17 ASA 81 was started by Dr. Evlyn Kanner after slurring of speech noted on phone. Could have had a beer. No further slurring. Still walking dalily. One day leaning to right. 6 weeks ago.   03/14/2018 -last echocardiogram in February 2019 showed moderate aortic regurgitation.  Overall reassuring.  No significant change.  Murmur heard on exam.  Dr. Matthias Hughs would like to know if it is okay to perform colonoscopy, I think it is fine.  He still continues to walk with his wife 2 miles a day 7 days a week. 3 other days of exercise. No CP, no SOB. [place].  No fevers chills nausea vomiting syncope bleeding.   Past Medical History:  Diagnosis Date  . 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 Arthritis    hands  . Elevated PSA    monitored by Dr. Earlene Plater  . Hypertension    takes diuretic for adrenal gland dysfunction, BP will be high without diuretic  . Vitamin D deficiency     Past Surgical History:  Procedure Laterality Date  . HAND SURGERY   09/21/1987   Enlarged mass on right palm overlying right long finger metacarpophalangeal joint removed  . INGUINAL HERNIA REPAIR  07/15/2012   Procedure: LAPAROSCOPIC BILATERAL INGUINAL HERNIA REPAIR;  Surgeon: Valarie Merino, MD;  Location: [facility];  Service: General;  Laterality: Bilateral;  Laparoscopic Bilateral Inguinal Hernia Repairs with Mesh  . left knee arthroscopy  3 years ago  . PELVIC FRACTURE SURGERY     chippped in high school  . TONSILLECTOMY AND ADENOIDECTOMY      Current Medications: Outpatient Medications Prior to Visit  Medication Sig Dispense Refill  . aspirin EC 81 MG tablet Take 81 mg by mouth daily.    Marland Kitchen atorvastatin (LIPITOR) 40 MG tablet Take 1 tablet (40 mg total) by mouth daily. 30 tablet 3  . Calcium 167 MG CAPS Take 1 capsule by mouth daily.    . Cholecalciferol (VITAMIN D-3) 5000 UNITS TABS Take 1 tablet by mouth every other day.    . Flaxseed, Linseed, (FLAX SEEDS PO) Take 1 tablet by mouth daily.    . Multiple Vitamin (MULTIVITAMIN WITH MINERALS) TABS Take 1 tablet by mouth daily.    . Omega-3 Fatty Acids (SALMON OIL-1000 PO) Take 1,000 mg by mouth daily.    Marland Kitchen spironolactone (ALDACTONE) 50 MG tablet TAKE 1 TABLET BY MOUTH EVERY DAY BEFORE BREAKFAST 90 tablet 3  . Ubiquinol 100  MG CAPS Take 1 capsule by mouth daily.    . vitamin B-12 (CYANOCOBALAMIN) 1000 MCG tablet Take 1,000 mcg by mouth daily.    . vitamin C (ASCORBIC ACID) 500 MG tablet Take 500 mg by mouth daily.    . metoprolol succinate (TOPROL-XL) 25 MG 24 hr tablet TAKE 0.5 TABLETS (12.5 MG TOTAL) BY MOUTH DAILY. 45 tablet 2  . Astaxanthin 4 MG CAPS Take 4 mg by mouth daily.    Marland Kitchen POLICOSANOL PO Take 20 mg by mouth daily.    . Saw Palmetto, Serenoa repens, 320 MG CAPS Take 1 tablet by mouth daily.     No facility-administered medications prior to visit.      Allergies:   Patient has no known allergies.   Social History   Socioeconomic History  . Marital status: Married    Spouse name:  [name]  . Number of children: 4  . Years of education: Not on file  . Highest education level: Master's degree (e.g., MA, MS, MEng, MEd, MSW, MBA)  Occupational History  . Occupation: retired  Engineer, production  . Financial resource strain: Not on file  . Food insecurity:    Worry: Not on file    Inability: Not on file  . Transportation needs:    Medical: Not on file    Non-medical: Not on file  Tobacco Use  . Smoking status: Former Smoker    Years: 20.00    Types: Pipe    Last attempt to quit: 05/20/1985    Years since quitting: 32.8  . Smokeless tobacco: Never Used  Substance and Sexual Activity  . Alcohol use: Yes    Comment: approx 4 beers a week  . Drug use: No  . Sexual activity: Not on file  Lifestyle  . Physical activity:    Days per week: Not on file    Minutes per session: Not on file  . Stress: Not on file  Relationships  . Social connections:    Talks on phone: Not on file    Gets together: Not on file    Attends religious service: Not on file    Active member of club or organization: Not on file    Attends meetings of clubs or organizations: Not on file    Relationship status: Not on file  Other Topics Concern  . Not on file  Social History Narrative   Patient is right-handed. He lives with his wife, [name], in a one story home. He drinks 2-3 cups of coffee that is 1/3 caffeine. He walks 2 miles, 7 days a week. Prior to retirement, he worked in Technical brewer for [employer].      Family History:  The patient's family history includes Cancer in his father and mother; Diabetes in his father; Heart disease in his mother; Stroke in his father.   ROS:   Please see the history of present illness.    Review of Systems  All other systems reviewed and are negative.     PHYSICAL EXAM:   VS:  BP 128/66   Pulse 64   Ht 5\' 8"  (1.727 m)   Wt 164 lb (74.4 kg)   SpO2 95%   BMI 24.94 kg/m    GEN: Well nourished, well developed, in no acute distress  HEENT: normal  Neck: no  JVD, carotid bruits, or masses Cardiac: RRR; 2/6 diastolic murmur right upper sternal border, no rubs, or gallops,no edema  Respiratory:  clear to auscultation bilaterally, normal work of breathing GI: soft,  nontender, nondistended, + BS MS: no deformity or atrophy  Skin: warm and dry, no rash Neuro:  Alert and Oriented x 3, Strength and sensation are intact Psych: euthymic mood, full affect   Wt Readings from Last 3 Encounters:  03/14/18 164 lb (74.4 kg)  12/26/17 164 lb (74.4 kg)  09/10/17 164 lb 6.4 oz (74.6 kg)      Studies/Labs Reviewed:   EKG:  EKG is ordered today.  03/14/18-sinus rhythm 64 mild first-degree AV block 238 ms, left anterior fascicular block, nonspecific ST-T wave flattening.  09/10/16-heart rate 61 bpm PR interval 234 ms, mild first-degree AV block, incomplete right bundle branch block, nonspecific ST-T wave changes.  Recent Labs: No results found for requested labs within last 8760 hours.   Lipid Panel    Component Value Date/Time   CHOL 184 12/26/2017 1053   TRIG 142 12/26/2017 1053   HDL 27 (L) 12/26/2017 1053   LDLCALC 129 (H) 12/26/2017 1053    Additional studies/ records that were reviewed today include:  Prior EKGs, office note, lab work reviewed  ECHO 10/03/16: - Left ventricle: The cavity size was normal. There was moderate   concentric hypertrophy. Systolic function was normal. Wall motion   was normal; there were no regional wall motion abnormalities.   Doppler parameters are consistent with abnormal left ventricular   relaxation (grade 1 diastolic dysfunction). Doppler parameters   are consistent with indeterminate ventricular filling pressure. - Aortic valve: Transvalvular velocity was within the normal range.   There was no stenosis. There was moderate to severe   regurgitation. - Mitral valve: Transvalvular velocity was within the normal range.   There was no evidence for stenosis. There was trivial   regurgitation. - Left atrium: The  atrium was moderately dilated. - Right ventricle: The cavity size was normal. Wall thickness was   normal. Systolic function was normal. - Atrial septum: No defect or patent foramen ovale was identified   by color flow Doppler. - Tricuspid valve: There was mild regurgitation. - Pulmonary arteries: Systolic pressure was within the normal   range. PA peak pressure: 31 mm Hg (S).  10/04/17 ECHO:  - No significant chang in aortic regurgitation severity since the   echo 09/2016. There is no diastolic flow reversal noted in the   descending aorta and the vena contracta is <0.6 cm, indicating   that it likely is not severe.  CT of abdomen: no liver cyst  Event monitor 12/24/2017:  Sinus rhythm with first-degree AV block at baseline.  Occasional premature atrial contractions and premature ventricular contractions.  No atrial fibrillation, no adverse arrhythmias, no pauses.  Palpitations are secondary to premature atrial contractions and premature ventricular contractions. Overall reassuring.  Please forward a copy to his primary physician, Dr. Evlyn Kanner as well as Dr. Matthias Hughs  Brain MRI: 01/2018 IMPRESSION: Advanced cerebral and cerebellar atrophy, extensive small vessel disease, numerous microbleeds and chronic lacunar infarcts as well as dolichoectasia of the proximal intracranial vessels, all consistent with longstanding hypertensive cerebrovascular disease.  No acute hemorrhage or acute infarct is evident.  Except for incidental RIGHT frontal venous angioma, no abnormal postcontrast enhancement.   Carotid ultrasound 01/2018  - mild B plaque.   ASSESSMENT:    1. Aortic valve insufficiency, etiology of cardiac valve disease unspecified   2. Benign hypertensive heart disease without heart failure   3. Palpitations   4. Hyperaldosteronism (HCC)      PLAN:  In order of problems listed above:  Aortic regurgitation -Moderate, normal  left ventricular ejection fraction,  asymptomatic.  Walking daily without any shortness of breath.  We will repeat echocardiogram in one year. -If symptoms do occur, it may be time for aortic valve replacement.  We need to watch for any reduction in his ejection fraction or dilation of his left ventricle.  Overall doing very well.  Continue with excellent exercise.  Slurred speech transiently, transient leaning to the right when walking, question TIA - Appreciate neurology.  Continue with low-dose aspirin.  Brain MRI with chronic hypertensive vascular changes.  No acute infarct.  Old small lacunar infarcts noted.  No further progression.  Carotid Dopplers mild plaque bilaterally.  Essential hypertension  - Spironolactone  - Dr. Patty Sermons started metoprolol 12.5 mg in 2017. We will stop.   - Being careful with first degree AV block.  Wife is relieved.  - Continue to monitor.  No changes.  Watch salt.  He enjoys popcorn  First-degree AV block  -Stopping metoprolol  -Event monitor in May 2019 was overall reassuring.  Preop cardiovascular assessment prior to colonoscopy -Dr. Matthias Hughs may proceed with colonoscopy with moderate overall cardiovascular risk based mainly upon age.  Medication Adjustments/Labs and Tests Ordered: Current medicines are reviewed at length with the patient today.  Concerns regarding medicines are outlined above.  Medication changes, Labs and Tests ordered today are listed in the Patient Instructions below. Patient Instructions  Medication Instructions:  Please discontinue your Metoprolol. Continue all other medications as listed.  Follow-Up: Follow up in 1 year with Dr. Anne Fu.  You will receive a letter in the mail 2 months before you are due.  Please call us when you receive this letter to schedule your follow up appointment.  Any Other Special Instructions Will Be Listed Below (If Applicable). OK to proceed with colonoscopy.  Thank you for choosing [facility]!!        Signed, Donato Schultz, MD  03/14/2018 9:56 AM    [facility] [address] [place], [place], [place]  [address] Phone: [phone]; Fax: [phone]

## 2018-03-14 ENCOUNTER — Encounter: Payer: Self-pay | Admitting: Cardiology

## 2018-03-14 ENCOUNTER — Encounter: Payer: Self-pay | Admitting: *Deleted

## 2018-03-14 ENCOUNTER — Ambulatory Visit (INDEPENDENT_AMBULATORY_CARE_PROVIDER_SITE_OTHER): Payer: Medicare Other | Admitting: Cardiology

## 2018-03-14 ENCOUNTER — Encounter (INDEPENDENT_AMBULATORY_CARE_PROVIDER_SITE_OTHER): Payer: Self-pay

## 2018-03-14 VITALS — BP 128/66 | HR 64 | Ht 68.0 in | Wt 164.0 lb

## 2018-03-14 DIAGNOSIS — E269 Hyperaldosteronism, unspecified: Secondary | ICD-10-CM | POA: Diagnosis not present

## 2018-03-14 DIAGNOSIS — I119 Hypertensive heart disease without heart failure: Secondary | ICD-10-CM

## 2018-03-14 DIAGNOSIS — R002 Palpitations: Secondary | ICD-10-CM

## 2018-03-14 DIAGNOSIS — I351 Nonrheumatic aortic (valve) insufficiency: Secondary | ICD-10-CM

## 2018-03-14 NOTE — Patient Instructions (Addendum)
Medication Instructions:  Please discontinue your Metoprolol. Continue all other medications as listed.  Follow-Up: Follow up in 1 year with Dr. Anne Fu.  You will receive a letter in the mail 2 months before you are due.  Please call us when you receive this letter to schedule your follow up appointment.  Any Other Special Instructions Will Be Listed Below (If Applicable). OK to proceed with colonoscopy.  Thank you for choosing [facility]!!

## 2018-04-25 NOTE — Progress Notes (Signed)
NEUROLOGY FOLLOW UP OFFICE NOTE  Nicholas Hancock [id number]  HISTORY OF PRESENT ILLNESS: Nicholas Hancock is an 83 year old right-handed male with hypertension, aortic valve insufficiency, benign hypertensive heart disease, hyperaldosteronism, BPH and former smoker who follows up for possible stroke and periodic limb movements of sleep  He is accompanied by his wife who supplements history.  UPDATE: Lipid panel from 12/30/17 was 129.  He was advised to start atorvastatin 40mg  daily.  EEG from 01/01/18 was normal.  MRI of brain with and without contrast from 01/17/18 was personally reviewed and demonstrated diffuse cerebral and cerebellar atrophy and extensive chronic small vessel disease including microbleeds and remote lacunar infarcts and dolichoectasia of proximal intracranial vessels.  Right frontal venous angioma noted without abnormal enhancement.  Carotid doppler from 01/20/18 showed mild bilateral atherosclerosis and intimal thickening of the internal carotid arteries but no hemodynamically significant stenosis (< 50%).  HISTORY: In February 2019, he was noted by family to be speaking more slowly and mildly confused.  He would ask questions about things he should already know the answer.  It was also noted that he seemed to be leaning to the right when he walked.  Symptoms lasted a week.  There is questionable facial asymmetry as her family.  He denied headache, visual disturbance, or dizziness.  Since then, he seems to drop objects such as coffee cups more frequently.Marland Kitchen  His PCP told him to start ASA 81mg  daily.  10/04/16 Echocardiogram:  LV EF 55%-60%, grade 1 diastolic dysfunction, moderate aortic regurgitation Cardiac event monitor was recently placed.  Stroke risk factors include hypertension, heart disease and former smoker.  He previously took medication for high cholesterol.  His wife also notes that for decades, his right arm and leg would jerk, lasting 20 seconds at a time.   There is no foaming of the mouth or incontinence.  It seems to be getting worse over time, maybe 4 times in a night, about 2 to 3 times a week.  He has no known history of seizures.   PAST MEDICAL HISTORY: Past Medical History:  Diagnosis Date  . 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 Arthritis    hands  . Elevated PSA    monitored by Dr. Earlene Plater  . Hypertension    takes diuretic for adrenal gland dysfunction, BP will be high without diuretic  . Vitamin D deficiency     MEDICATIONS: Current Outpatient Medications on File Prior to Visit  Medication Sig Dispense Refill  . aspirin EC 81 MG tablet Take 81 mg by mouth daily.    Marland Kitchen atorvastatin (LIPITOR) 40 MG tablet Take 1 tablet (40 mg total) by mouth daily. 30 tablet 3  . Calcium 167 MG CAPS Take 1 capsule by mouth daily.    . Cholecalciferol (VITAMIN D-3) 5000 UNITS TABS Take 1 tablet by mouth every other day.    . Flaxseed, Linseed, (FLAX SEEDS PO) Take 1 tablet by mouth daily.    . Multiple Vitamin (MULTIVITAMIN WITH MINERALS) TABS Take 1 tablet by mouth daily.    . Omega-3 Fatty Acids (SALMON OIL-1000 PO) Take 1,000 mg by mouth daily.    Marland Kitchen spironolactone (ALDACTONE) 50 MG tablet TAKE 1 TABLET BY MOUTH EVERY DAY BEFORE BREAKFAST 90 tablet 3  . Ubiquinol 100 MG CAPS Take 1 capsule by mouth daily.    . vitamin B-12 (CYANOCOBALAMIN) 1000 MCG tablet Take 1,000 mcg by mouth daily.    Marland Kitchen  vitamin C (ASCORBIC ACID) 500 MG tablet Take 500 mg by mouth daily.     No current facility-administered medications on file prior to visit.     ALLERGIES: No Known Allergies  FAMILY HISTORY: Family History  Problem Relation Age of Onset  . Stroke Father   . Diabetes Father        `  . Cancer Father        unknown  . Heart disease Mother   . Cancer Mother        unknown   SOCIAL HISTORY: Social History   Socioeconomic History  . Marital status: Married    Spouse name:  Nicholas Hancock  . Number of children: 4  . Years of education: Not on file  . Highest education level: Master's degree (e.g., MA, MS, MEng, MEd, MSW, MBA)  Occupational History  . Occupation: retired  Engineer, production  . Financial resource strain: Not on file  . Food insecurity:    Worry: Not on file    Inability: Not on file  . Transportation needs:    Medical: Not on file    Non-medical: Not on file  Tobacco Use  . Smoking status: Former Smoker    Years: 20.00    Types: Pipe    Last attempt to quit: 05/20/1985    Years since quitting: 32.9  . Smokeless tobacco: Never Used  Substance and Sexual Activity  . Alcohol use: Yes    Comment: approx 4 beers a week  . Drug use: No  . Sexual activity: Not on file  Lifestyle  . Physical activity:    Days per week: Not on file    Minutes per session: Not on file  . Stress: Not on file  Relationships  . Social connections:    Talks on phone: Not on file    Gets together: Not on file    Attends religious service: Not on file    Active member of club or organization: Not on file    Attends meetings of clubs or organizations: Not on file    Relationship status: Not on file  . Intimate partner violence:    Fear of current or ex partner: Not on file    Emotionally abused: Not on file    Physically abused: Not on file    Forced sexual activity: Not on file  Other Topics Concern  . Not on file  Social History Narrative   Patient is right-handed. He lives with his wife, Nicholas Hancock, in a one story home. He drinks 2-3 cups of coffee that is 1/3 caffeine. He walks 2 miles, 7 days a week. Prior to retirement, he worked in Technical brewer for [employer].     REVIEW OF SYSTEMS: Constitutional: No fevers, chills, or sweats, no generalized fatigue, change in appetite Eyes: No visual changes, double vision, eye pain Ear, nose and throat: No hearing loss, ear pain, nasal congestion, sore throat Cardiovascular: No chest pain, palpitations Respiratory:  No shortness of  breath at rest or with exertion, wheezes GastrointestinaI: No nausea, vomiting, diarrhea, abdominal pain, fecal incontinence Genitourinary:  No dysuria, urinary retention or frequency Musculoskeletal:  No neck pain, back pain Integumentary: No rash, pruritus, skin lesions Neurological: as above Psychiatric: No depression, insomnia, anxiety Endocrine: No palpitations, fatigue, diaphoresis, mood swings, change in appetite, change in weight, increased thirst Hematologic/Lymphatic:  No purpura, petechiae. Allergic/Immunologic: no itchy/runny eyes, nasal congestion, recent allergic reactions, rashes  PHYSICAL EXAM: Blood pressure 118/80, pulse 88, height 5' 6.5" (1.689 m), weight 160  lb (72.6 kg), SpO2 97 %. General: No acute distress.  Patient appears well-groomed.  Head:  Normocephalic/atraumatic Eyes:  Fundi examined but not visualized Neck: supple, no paraspinal tenderness, full range of motion Heart:  Regular rate and rhythm Lungs:  Clear to auscultation bilaterally Back: No paraspinal tenderness Neurological Exam: alert and oriented to person, place, and time. Attention span and concentration intact, recent and remote memory intact, fund of knowledge intact.  Speech fluent and not dysarthric, language intact.  CN II-XII intact. Bulk and tone normal, muscle strength 5/5 throughout.  Sensation to light touch intact.  Deep tendon reflexes 2+ throughout, toes downgoing.  Finger to nose testing intact.  Gait with stooped posture and short stride.  Romberg negative.  IMPRESSION: Possible stroke Periodic limb movements of sleep  PLAN: 1.  ASA 81mg  daily for secondary stroke prevention 2.  Continue atorvastatin 40mg  daily.  Will recheck lipid panel. 3.  Continue blood pressure and glycemic control 4.  As PLMS not frequent or disruptive, will monitor and not treat 5.  Follow up in 6 months.  15 minutes spent face to face with patient, over 50% spent discussing diagnosis and coordination of  care.  Shon Millet, DO  CC:  Adrian Prince, MD  Donato Schultz, MD

## 2018-04-28 ENCOUNTER — Ambulatory Visit (INDEPENDENT_AMBULATORY_CARE_PROVIDER_SITE_OTHER): Payer: Medicare Other | Admitting: Neurology

## 2018-04-28 ENCOUNTER — Other Ambulatory Visit (INDEPENDENT_AMBULATORY_CARE_PROVIDER_SITE_OTHER): Payer: Medicare Other

## 2018-04-28 ENCOUNTER — Encounter: Payer: Self-pay | Admitting: Neurology

## 2018-04-28 VITALS — BP 118/80 | HR 88 | Ht 66.5 in | Wt 160.0 lb

## 2018-04-28 DIAGNOSIS — G4761 Periodic limb movement disorder: Secondary | ICD-10-CM | POA: Diagnosis not present

## 2018-04-28 DIAGNOSIS — I639 Cerebral infarction, unspecified: Secondary | ICD-10-CM

## 2018-04-28 LAB — LIPID PANEL
CHOL/HDL RATIO: 6
CHOLESTEROL: 177 mg/dL (ref 0–200)
HDL: 31 mg/dL — AB (ref >39.00)
LDL CALC: 123 mg/dL — AB (ref 0–99)
NONHDL: 145.52
TRIGLYCERIDES: 113 mg/dL (ref 0.0–149.0)
VLDL: 22.6 mg/dL (ref 0.0–40.0)

## 2018-04-28 NOTE — Patient Instructions (Addendum)
1.  Continue aspirin 81mg  daily 2.  Continue atorvastatin 40mg  daily.  Will check another lipid panel 3.  Continue blood pressure control 4.  Follow up in 6 months.  Your provider requests that you have LABS drawn today.  We share a lab with [facility] - they are located in [address] (second floor) of this building.  Once you get there, please have a seat and the phlebotomist will call your name.  If you have waited more than 15 minutes, please advise the front desk

## 2018-04-28 NOTE — Addendum Note (Signed)
Addended by: Dorthy Cooler on: 04/28/2018 10:46 AM   Modules accepted: Orders

## 2018-04-29 ENCOUNTER — Telehealth: Payer: Self-pay

## 2018-04-29 DIAGNOSIS — Z13220 Encounter for screening for lipoid disorders: Secondary | ICD-10-CM

## 2018-04-29 NOTE — Telephone Encounter (Signed)
Called and confirmed Pt is taking 40 mg daily of atorvastatin, advise him to increase to 80 mg and repeat labs in 3 mo

## 2018-04-29 NOTE — Telephone Encounter (Signed)
-----   Message from [name], DO sent at 04/29/2018  8:35 AM EDT ----- The LDL (bad cholesterol) barely changed.  First verify that he is taking the atorvastatin 40mg  daily.  If he is, then I would increase to 80mg  daily and repeat lipid panel in 3 months

## 2018-08-28 ENCOUNTER — Telehealth: Payer: Self-pay | Admitting: Cardiology

## 2018-08-28 NOTE — Telephone Encounter (Signed)
New Message   Requesting paperwork for Handicap sticker.  Please call

## 2018-08-28 NOTE — Telephone Encounter (Signed)
Spoke with pt to inquire about need for handicap placard.  Pt moved from [facility] to an apartment.  States he has to walk up an incline to get to the apartment but does not get winded.  His and his wife's concern is that if he is trying to carry a few items in that he will likely fall trying to walk that long distance.  If he can get a handicap placard he would be much closer to the door.  Advised I will send to Dr. Anne Fu for review.

## 2018-09-02 NOTE — Telephone Encounter (Signed)
I agree with handicap placard.  Donato Schultz, MD

## 2018-09-03 NOTE — Telephone Encounter (Signed)
Pt called back and would like to pick the placard up.  Aware I will place it at the front desk for p/u.  Reviewed st address and office location with him.

## 2018-09-03 NOTE — Telephone Encounter (Signed)
Handicap placard completed.  Called and left message on voicemail to make pt aware and to find out if he would prefer it be mailed or if he would like to pick it up.  Will await a return call from patient

## 2018-10-02 ENCOUNTER — Other Ambulatory Visit: Payer: Self-pay | Admitting: Cardiology

## 2018-10-26 NOTE — Progress Notes (Signed)
NEUROLOGY FOLLOW UP OFFICE NOTE  Nicholas Hancock [id number]  HISTORY OF PRESENT ILLNESS: Nicholas Hancock is an 84 year old right-handed male with hypertension, aortic valve insufficiency, benign hypertensive heart disease, hyperaldosteronism, BPHand former smokerwho follows up for possible stroke.He is accompanied by his wife who supplements history.  UPDATE: LDL from 04/28/18 was 123.  He was advised to increase atorvastatin from 40mg  to 80mg  daily however he has not made that adjustment yet.  He is also taking ASA 81mg  daily.    He feels that is gait is worse.  He has fallen about 3 times since last visit.    HISTORY: In February 2019, he was noted by family to be speaking more slowly and mildly confused. He would ask questions about things he should already know the answer. It was also noted that he seemed to be leaning to the right when he walked. Symptoms lasted a week. There is questionable facial asymmetry as her family. He denied headache, visual disturbance, or dizziness. Since then, he seems to drop objects such as coffee cups more frequently.Marland Kitchen His PCP told him to start ASA 81mg  daily.  EEG from 01/01/18 was normal.  MRI of brain with and without contrast from 01/17/18 was personally reviewed and demonstrated diffuse cerebral and cerebellar atrophy and extensive chronic small vessel disease including microbleeds and remote lacunar infarcts and dolichoectasia of proximal intracranial vessels.  Right frontal venous angioma noted without abnormal enhancement.  Carotid doppler from 01/20/18 showed mild bilateral atherosclerosis and intimal thickening of the internal carotid arteries but no hemodynamically significant stenosis (< 50%).  10/04/16 Echocardiogram: LV EF 55%-60%, grade 1 diastolic dysfunction, moderate aortic regurgitation Cardiac event monitorwas recently placed.  Stroke risk factors include hypertension, heart disease and former smoker. He previously took  medication for high cholesterol.  His wife also notes that for decades, his right arm and leg would jerk, lasting 20 seconds at a time. There is no foaming of the mouth or incontinence. It seems to be getting worse over time, maybe 4 times in a night, about 2 to 3 times a week. He has no known history of seizures.  PAST MEDICAL HISTORY: Past Medical History:  Diagnosis Date  . 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 Arthritis    hands  . Elevated PSA    monitored by Dr. Earlene Plater  . Hypertension    takes diuretic for adrenal gland dysfunction, BP will be high without diuretic  . Vitamin D deficiency     MEDICATIONS: Current Outpatient Medications on File Prior to Visit  Medication Sig Dispense Refill  . aspirin EC 81 MG tablet Take 81 mg by mouth daily.    Marland Kitchen atorvastatin (LIPITOR) 40 MG tablet Take 1 tablet (40 mg total) by mouth daily. 30 tablet 3  . Calcium 167 MG CAPS Take 1 capsule by mouth daily.    . Cholecalciferol (VITAMIN D-3) 5000 UNITS TABS Take 1 tablet by mouth every other day.    . Flaxseed, Linseed, (FLAX SEEDS PO) Take 1 tablet by mouth daily.    . Multiple Vitamin (MULTIVITAMIN WITH MINERALS) TABS Take 1 tablet by mouth daily.    . Omega-3 Fatty Acids (SALMON OIL-1000 PO) Take 1,000 mg by mouth daily.    Marland Kitchen spironolactone (ALDACTONE) 50 MG tablet TAKE 1 TABLET BY MOUTH EVERY DAY BEFORE BREAKFAST 90 tablet 3  . Ubiquinol 100 MG CAPS Take 1 capsule by mouth daily.    Marland Kitchen  vitamin B-12 (CYANOCOBALAMIN) 1000 MCG tablet Take 1,000 mcg by mouth daily.    . vitamin C (ASCORBIC ACID) 500 MG tablet Take 500 mg by mouth daily.     No current facility-administered medications on file prior to visit.     ALLERGIES: No Known Allergies  FAMILY HISTORY: Family History  Problem Relation Age of Onset  . Stroke Father   . Diabetes Father        `  . Cancer Father        unknown  . Heart disease Mother   .  Cancer Mother        unknown   SOCIAL HISTORY: Social History   Socioeconomic History  . Marital status: Married    Spouse name: Nicholas Hancock  . Number of children: 4  . Years of education: Not on file  . Highest education level: Master's degree (e.g., MA, MS, MEng, MEd, MSW, MBA)  Occupational History  . Occupation: retired  Engineer, production  . Financial resource strain: Not on file  . Food insecurity:    Worry: Not on file    Inability: Not on file  . Transportation needs:    Medical: Not on file    Non-medical: Not on file  Tobacco Use  . Smoking status: Former Smoker    Years: 20.00    Types: Pipe    Last attempt to quit: 05/20/1985    Years since quitting: 33.4  . Smokeless tobacco: Never Used  Substance and Sexual Activity  . Alcohol use: Yes    Comment: approx 4 beers a week  . Drug use: No  . Sexual activity: Not on file  Lifestyle  . Physical activity:    Days per week: Not on file    Minutes per session: Not on file  . Stress: Not on file  Relationships  . Social connections:    Talks on phone: Not on file    Gets together: Not on file    Attends religious service: Not on file    Active member of club or organization: Not on file    Attends meetings of clubs or organizations: Not on file    Relationship status: Not on file  . Intimate partner violence:    Fear of current or ex partner: Not on file    Emotionally abused: Not on file    Physically abused: Not on file    Forced sexual activity: Not on file  Other Topics Concern  . Not on file  Social History Narrative   Patient is right-handed. He lives with his wife, Nicholas Hancock, in a one story home. He drinks 2-3 cups of coffee that is 1/3 caffeine. He walks 2 miles, 7 days a week. Prior to retirement, he worked in Technical brewer for [employer].     REVIEW OF SYSTEMS: Constitutional: No fevers, chills, or sweats, no generalized fatigue, change in appetite Eyes: No visual changes, double vision, eye pain Ear, nose and throat:  No hearing loss, ear pain, nasal congestion, sore throat Cardiovascular: No chest pain, palpitations Respiratory:  No shortness of breath at rest or with exertion, wheezes GastrointestinaI: No nausea, vomiting, diarrhea, abdominal pain, fecal incontinence Genitourinary:  No dysuria, urinary retention or frequency Musculoskeletal:  No neck pain, back pain Integumentary: No rash, pruritus, skin lesions Neurological: as above Psychiatric: No depression, insomnia, anxiety Endocrine: No palpitations, fatigue, diaphoresis, mood swings, change in appetite, change in weight, increased thirst Hematologic/Lymphatic:  No purpura, petechiae. Allergic/Immunologic: no itchy/runny eyes, nasal congestion, recent allergic reactions,  rashes  PHYSICAL EXAM: Blood pressure (!) 134/58, pulse 84, temperature 98 F (36.7 C), height 5\' 8"  (1.727 m), weight 159 lb (72.1 kg), SpO2 97 %. General: No acute distress.  Patient appears well-groomed.   Head:  Normocephalic/atraumatic Eyes:  Fundi examined but not visualized Neck: supple, no paraspinal tenderness, full range of motion Heart:  Regular rate and rhythm Lungs:  Clear to auscultation bilaterally Back: No paraspinal tenderness Neurological Exam: alert and oriented to person, place, and time. Attention span and concentration intact, recent and remote memory intact, fund of knowledge intact.  Speech fluent and not dysarthric, language intact.  CN II-XII intact. Bulk and tone normal, muscle strength 5/5 throughout.  Sensation to light touch, temperature and vibration intact.  Deep tendon reflexes 2+ throughout, toes downgoing.  Finger to nose and heel to shin testing intact.  Gait normal, Romberg negative.  IMPRESSION: 1.  Possible stroke  2.  Unsteady gait.   PLAN: 1.  ASA 81mg  daily for secondary stroke prevention 2.  Increase atorvastatin to 80mg  daily.  Repeat lipid panel in 3 months. 3.  Blood pressure and glycemic control. 4.  Once ready to proceed  with physical therapy for gait, he will contact us. 5.  Follow up in 6 months.  21 minutes spent face to face with patient, over 50% spent discussing management.  Shon Millet, DO  CC: Adrian Prince, MD

## 2018-10-27 ENCOUNTER — Ambulatory Visit (INDEPENDENT_AMBULATORY_CARE_PROVIDER_SITE_OTHER): Payer: Medicare Other | Admitting: Neurology

## 2018-10-27 ENCOUNTER — Encounter: Payer: Self-pay | Admitting: Neurology

## 2018-10-27 ENCOUNTER — Other Ambulatory Visit: Payer: Self-pay

## 2018-10-27 VITALS — BP 134/58 | HR 84 | Temp 98.0°F | Ht 68.0 in | Wt 159.0 lb

## 2018-10-27 DIAGNOSIS — I639 Cerebral infarction, unspecified: Secondary | ICD-10-CM

## 2018-10-27 DIAGNOSIS — R2681 Unsteadiness on feet: Secondary | ICD-10-CM | POA: Diagnosis not present

## 2018-10-27 MED ORDER — ATORVASTATIN CALCIUM 80 MG PO TABS: 80.0000 mg | ORAL_TABLET | Freq: Every day | ORAL | 5 refills | Status: DC

## 2018-10-27 NOTE — Patient Instructions (Addendum)
1.  I have increased atorvastatin to 80mg  daily.  I want to repeat lipid panel (cholesterol test) in 3 months 2.  Continue aspirin 81mg  daily 3.  I will refer you for physical therapy to help with balance.  When everything about the virus settles down, contact me when you would like to proceed 4.  Follow up in 6 months.

## 2018-11-10 ENCOUNTER — Telehealth: Payer: Self-pay | Admitting: Neurology

## 2018-11-10 DIAGNOSIS — E7800 Pure hypercholesterolemia, unspecified: Secondary | ICD-10-CM

## 2018-11-10 MED ORDER — ROSUVASTATIN CALCIUM 10 MG PO TABS: 10.0000 mg | ORAL_TABLET | Freq: Every day | ORAL | 5 refills | Status: DC

## 2018-11-10 NOTE — Telephone Encounter (Signed)
Called Pt, LMOVM advising to d/c Lipitor and start Crestor 10 mg QD, re-check lipids in 3 months, OV in 6 months, asking him to call the office to schedule

## 2018-11-10 NOTE — Telephone Encounter (Signed)
Start Crestor 10mg  daily.  Should repeat lipid panel in 3 months.  He should make a follow up appointment with me in 6 months.

## 2018-11-10 NOTE — Telephone Encounter (Signed)
Patient is calling in wanting to speak with you in regards to taking lipator. He said that it is crippling him on his other leg and wants a different medication. (cresta...) Please call him back at [phone]. Thanks!

## 2018-11-13 ENCOUNTER — Other Ambulatory Visit: Payer: Self-pay | Admitting: Neurology

## 2018-11-17 ENCOUNTER — Ambulatory Visit (INDEPENDENT_AMBULATORY_CARE_PROVIDER_SITE_OTHER): Payer: Medicare Other | Admitting: Orthopaedic Surgery

## 2018-11-17 ENCOUNTER — Other Ambulatory Visit: Payer: Self-pay

## 2018-11-17 ENCOUNTER — Ambulatory Visit (INDEPENDENT_AMBULATORY_CARE_PROVIDER_SITE_OTHER): Payer: Self-pay

## 2018-11-17 ENCOUNTER — Ambulatory Visit (INDEPENDENT_AMBULATORY_CARE_PROVIDER_SITE_OTHER): Payer: Medicare Other

## 2018-11-17 DIAGNOSIS — M25551 Pain in right hip: Secondary | ICD-10-CM

## 2018-11-17 DIAGNOSIS — M5431 Sciatica, right side: Secondary | ICD-10-CM | POA: Diagnosis not present

## 2018-11-17 MED ORDER — METHOCARBAMOL 500 MG PO TABS: 500.0000 mg | ORAL_TABLET | Freq: Four times a day (QID) | ORAL | 1 refills | Status: DC | PRN

## 2018-11-17 MED ORDER — METHYLPREDNISOLONE 4 MG PO TABS: ORAL_TABLET | ORAL | 0 refills | Status: DC

## 2018-11-17 NOTE — Progress Notes (Signed)
Office Visit Note   Patient: Nicholas Hancock           Date of Birth: [birth year]           MRN: [id number] Visit Date: 11/17/2018              Requested by: Adrian Prince, MD [address] [place], [place] [address] PCP: Adrian Prince, MD   Assessment & Plan: Visit Diagnoses:  1. Pain in right hip   2. Sciatica, right side     Plan: Based on his signs and symptoms and clinical exam I do not feel that this is a hip issue.  He seems to be more pelvic or sciatic related.  Right now I would recommend an MRI at some point of his lumbar spine if he continues to have issues.  I will try a 6-day steroid taper and some Robaxin as well.  All question concerns were answered and addressed.  We can see him back in a month to see how he is doing overall.  By then hopefully be able to order physical therapy and/or MRI if it is still needed and we are able to do so in light of the coronavirus pandemic.  Follow-Up Instructions: Return in about 4 weeks (around 12/15/2018).   Orders:  Orders Placed This Encounter  Procedures  . XR HIP UNILAT W OR W/O PELVIS 1V RIGHT  . XR Lumbar Spine 2-3 Views   Meds ordered this encounter  Medications  . methylPREDNISolone (MEDROL) 4 MG tablet    Sig: Medrol dose pack. Take as instructed    Dispense:  21 tablet    Refill:  0  . methocarbamol (ROBAXIN) 500 MG tablet    Sig: Take 1 tablet (500 mg total) by mouth every 6 (six) hours as needed for muscle spasms.    Dispense:  60 tablet    Refill:  1      Procedures: No procedures performed   Clinical Data: No additional findings.   Subjective: Chief Complaint  Patient presents with  . Right Hip - Pain  Patient is a very pleasant 84 year old gentleman who comes in with chief complaint of right hip pain after a fall several months ago.  According to his wife though he has had multiple falls over the last year after having a stroke a year ago.  He ambulates using a cane.  He has had decreased range of  motion and strength.  He points the sciatic region as a source of his pain and says is really painful at night.  He is not on blood thinning medications.  He denies any groin pain.  He used to be an avid runner and was much more active until his stroke.  Again he states the pain bothers him the most at night.  He is not a diabetic.  HPI  Review of Systems He currently denies any headache, chest pain, shortness of breath, fever, chills, nausea, vomiting  Objective: Vital Signs: There were no vitals taken for this visit.  Physical Exam He is alert and orient x3 and in no acute distress Ortho Exam Examination of his right hip shows just some slight stiffness with internal and external rotation but at the same on his opposite side.  He has no pain in the groin with rotation around his right hip.  He has no pain with compression of the hip.  He has no pain to palpation of the trochanteric area of the IT band.  His pain seems  to be more sciatic related and posterior pelvis on exam.  He has good strength in his bilateral lower extremities and no numbness and tingling. Specialty Comments:  No specialty comments available.  Imaging: Xr Hip Unilat W Or W/o Pelvis 1v Right  Result Date: 11/17/2018 An AP pelvis and lateral the right hip shows no acute findings.  There is no evidence of fracture or previous fracture.  There is only minimal arthritic changes in either hip.  There is spurring around the trochanteric areas but again no evidence of fracture and no significant arthritic findings.  Xr Lumbar Spine 2-3 Views  Result Date: 11/17/2018 2 views of the lumbar spine show no acute findings.  There is degenerative changes at multiple levels which are quite significant.    PMFS History: Patient Active Problem List   Diagnosis Date Noted  . 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 Bilateral inguinal hernia-left>right 06/13/2012  . Benign hypertensive heart disease without heart failure  05/22/2011   Past Medical History:  Diagnosis Date  . 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 Arthritis    hands  . Elevated PSA    monitored by Dr. Earlene Plater  . Hypertension    takes diuretic for adrenal gland dysfunction, BP will be high without diuretic  . Vitamin D deficiency     Family History  Problem Relation Age of Onset  . Stroke Father   . Diabetes Father        `  . Cancer Father        unknown  . Heart disease Mother   . Cancer Mother        unknown    Past Surgical History:  Procedure Laterality Date  . HAND SURGERY  09/21/1987   Enlarged mass on right palm overlying right long finger metacarpophalangeal joint removed  . INGUINAL HERNIA REPAIR  07/15/2012   Procedure: LAPAROSCOPIC BILATERAL INGUINAL HERNIA REPAIR;  Surgeon: Valarie Merino, MD;  Location: [facility];  Service: General;  Laterality: Bilateral;  Laparoscopic Bilateral Inguinal Hernia Repairs with Mesh  . left knee arthroscopy  3 years ago  . PELVIC FRACTURE SURGERY     chippped in high school  . TONSILLECTOMY AND ADENOIDECTOMY     Social History   Occupational History  . Occupation: retired  Tobacco Use  . Smoking status: Former Smoker    Years: 20.00    Types: Pipe    Last attempt to quit: 05/20/1985    Years since quitting: 33.5  . Smokeless tobacco: Never Used  Substance and Sexual Activity  . Alcohol use: Yes    Comment: approx 4 beers a week  . Drug use: No  . Sexual activity: Not on file

## 2018-12-15 ENCOUNTER — Ambulatory Visit (INDEPENDENT_AMBULATORY_CARE_PROVIDER_SITE_OTHER): Payer: Medicare Other | Admitting: Orthopaedic Surgery

## 2019-02-18 ENCOUNTER — Other Ambulatory Visit: Payer: Medicare Other

## 2019-05-19 ENCOUNTER — Telehealth: Payer: Self-pay

## 2019-05-19 NOTE — Telephone Encounter (Signed)

## 2019-05-20 ENCOUNTER — Telehealth (INDEPENDENT_AMBULATORY_CARE_PROVIDER_SITE_OTHER): Payer: Medicare Other | Admitting: Cardiology

## 2019-05-20 ENCOUNTER — Encounter: Payer: Self-pay | Admitting: Cardiology

## 2019-05-20 ENCOUNTER — Other Ambulatory Visit: Payer: Self-pay

## 2019-05-20 VITALS — BP 168/58 | HR 73 | Ht 68.0 in

## 2019-05-20 DIAGNOSIS — I351 Nonrheumatic aortic (valve) insufficiency: Secondary | ICD-10-CM

## 2019-05-20 DIAGNOSIS — I119 Hypertensive heart disease without heart failure: Secondary | ICD-10-CM

## 2019-05-20 NOTE — Progress Notes (Signed)
Cardiology Note    Visit conducted via telehealth, telephone call due to COVID-19 pandemic.  Education surrounding pandemic discussed.  Total time 19 minutes including chart review.  Verbal consent was obtained for tele-health visit.  Date:  05/20/2019   ID:  Nicholas Hancock, DOB 1934-12-12, MRN [id number]  PCP:  Reynold Bowen, MD  Cardiologist:   Candee Furbish, MD     History of Present Illness:  Nicholas Hancock is a 84 y.o. male former patient of Dr. Sherryl Barters here for one-year follow-up, hypertension. In the past, hypertension and responded well to spironolactone. Sized block several miles a day. Dr. Carrolyn Meiers is his internist. Manages his lab work.  In the past he had first-degree AV block with premature atrial complexes.  Dr. Mare Ferrari placed him on metoprolol succinate 12.5 mg a day to help with PACs and blood pressure.  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 This is why he is on the spironolactone.  He still walks 2 miles a day 7 days a week with his wife. No chest pain, no syncope, no orthopnea, no PND. He worked for [employer] for several years.  09/10/17 ASA 81 was started by Dr. Forde Dandy after slurring of speech noted on phone. Could have had a beer. No further slurring. Still walking dalily. One day leaning to right. 6 weeks ago.   03/14/2018 -last echocardiogram in February 2019 showed moderate aortic regurgitation.  Overall reassuring.  No significant change.  Murmur heard on exam.  Dr. Cristina Gong would like to know if it is okay to perform colonoscopy, I think it is fine.  He still continues to walk with his wife 2 miles a day 7 days a week. 3 other days of exercise. No CP, no SOB. [facility].  No fevers chills nausea vomiting syncope bleeding.  05/20/2019- here for follow-up of aortic regurgitation.  Overall he has not had any worsening shortness of breath fevers chills nausea vomiting syncope. Being at [facility] for a few months, he was  getting worried about money and they decided to leave.  He has now decided to go back hopefully in the next few months.  Past Medical History:  Diagnosis Date  . 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 Arthritis    hands  . Elevated PSA    monitored by Dr. Rosana Hoes  . Hypertension    takes diuretic for adrenal gland dysfunction, BP will be high without diuretic  . Vitamin D deficiency     Past Surgical History:  Procedure Laterality Date  . HAND SURGERY  09/21/1987   Enlarged mass on right palm overlying right long finger metacarpophalangeal joint removed  . INGUINAL HERNIA REPAIR  07/15/2012   Procedure: LAPAROSCOPIC BILATERAL INGUINAL HERNIA REPAIR;  Surgeon: Pedro Earls, MD;  Location: [facility];  Service: General;  Laterality: Bilateral;  Laparoscopic Bilateral Inguinal Hernia Repairs with Mesh  . left knee arthroscopy  3 years ago  . PELVIC FRACTURE SURGERY     chippped in high school  . TONSILLECTOMY AND ADENOIDECTOMY      Current Medications: Outpatient Medications Prior to Visit  Medication Sig Dispense Refill  . aspirin EC 81 MG tablet Take 81 mg by mouth daily.    Marland Kitchen atorvastatin (LIPITOR) 80 MG tablet Take 1 tablet (80 mg total) by mouth daily. 30 tablet 5  . Cholecalciferol (VITAMIN D-3) 5000 UNITS TABS Take 1 tablet by mouth every other  day.    . Flaxseed, Linseed, (FLAX SEEDS PO) Take 1 tablet by mouth daily.    . Omega-3 Fatty Acids (SALMON OIL-1000 PO) Take 1,000 mg by mouth daily.    Marland Kitchen. spironolactone (ALDACTONE) 50 MG tablet TAKE 1 TABLET BY MOUTH EVERY DAY BEFORE BREAKFAST 90 tablet 3  . Ubiquinol 100 MG CAPS Take 1 capsule by mouth daily.    . vitamin B-12 (CYANOCOBALAMIN) 1000 MCG tablet Take 1,000 mcg by mouth daily.    . vitamin C (ASCORBIC ACID) 500 MG tablet Take 500 mg by mouth daily.    . Calcium 167 MG CAPS Take 1 capsule by mouth daily.    . Multiple Vitamin (MULTIVITAMIN WITH MINERALS)  TABS Take 1 tablet by mouth daily.     No facility-administered medications prior to visit.      Allergies:   Patient has no known allergies.   Social History   Socioeconomic History  . Marital status: Married    Spouse name: Alona BeneJoyce  . Number of children: 4  . Years of education: Not on file  . Highest education level: Master's degree (e.g., MA, MS, MEng, MEd, MSW, MBA)  Occupational History  . Occupation: retired  Engineer, productionocial Needs  . Financial resource strain: Not on file  . Food insecurity    Worry: Not on file    Inability: Not on file  . Transportation needs    Medical: Not on file    Non-medical: Not on file  Tobacco Use  . Smoking status: Former Smoker    Years: 20.00    Types: Pipe    Quit date: 05/20/1985    Years since quitting: 34.0  . Smokeless tobacco: Never Used  Substance and Sexual Activity  . Alcohol use: Yes    Comment: approx 4 beers a week  . Drug use: No  . Sexual activity: Not on file  Lifestyle  . Physical activity    Days per week: Not on file    Minutes per session: Not on file  . Stress: Not on file  Relationships  . Social Musicianconnections    Talks on phone: Not on file    Gets together: Not on file    Attends religious service: Not on file    Active member of club or organization: Not on file    Attends meetings of clubs or organizations: Not on file    Relationship status: Not on file  Other Topics Concern  . Not on file  Social History Narrative   Patient is right-handed. He lives with his wife, Alona BeneJoyce, in a one story home. He drinks 2-3 cups of coffee that is 1/3 caffeine. He walks 2 miles, 7 days a week. 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      Family History:  The patient's family history includes Cancer in his father and mother; Diabetes in his father; Heart disease in his mother; Stroke in his father.   ROS:   Please see the history of present illness.    Review of Systems  All other systems reviewed and are  negative.     PHYSICAL EXAM:   VS:  BP (!) 168/58   Pulse 73   Ht 5\' 8"  (1.727 m)   BMI 24.18 kg/m    General: Alert and oriented x3 no acute distress pleasant, able to complete full sentences without difficulty   Wt Readings from Last 3 Encounters:  10/27/18 159 lb (72.1 kg)  04/28/18 160 lb (72.6 kg)  03/14/18 164 lb (74.4 kg)      Studies/Labs Reviewed:   EKG:  03/14/18-sinus rhythm 64 mild first-degree AV block 238 ms, left anterior fascicular block, nonspecific ST-T wave flattening.  09/10/16-heart rate 61 bpm PR interval 234 ms, mild first-degree AV block, incomplete right bundle branch block, nonspecific ST-T wave changes.  Recent Labs: No results found for requested labs within last 8760 hours.   Lipid Panel    Component Value Date/Time   CHOL 177 04/28/2018 1047   CHOL 184 12/26/2017 1053   TRIG 113.0 04/28/2018 1047   HDL 31.00 (L) 04/28/2018 1047   HDL 27 (L) 12/26/2017 1053   CHOLHDL 6 04/28/2018 1047   VLDL 22.6 04/28/2018 1047   LDLCALC 123 (H) 04/28/2018 1047   LDLCALC 129 (H) 12/26/2017 1053    Additional studies/ records that were reviewed today include:  Prior EKGs, office note, lab work reviewed  ECHO 10/03/16: - Left ventricle: The cavity size was normal. There was moderate   concentric hypertrophy. Systolic function was normal. Wall motion   was normal; there were no regional wall motion abnormalities.   Doppler parameters are consistent with abnormal left ventricular   relaxation (grade 1 diastolic dysfunction). Doppler parameters   are consistent with indeterminate ventricular filling pressure. - Aortic valve: Transvalvular velocity was within the normal range.   There was no stenosis. There was moderate to severe   regurgitation. - Mitral valve: Transvalvular velocity was within the normal range.   There was no evidence for stenosis. There was trivial   regurgitation. - Left atrium: The atrium was moderately dilated. - Right ventricle: The  cavity size was normal. Wall thickness was   normal. Systolic function was normal. - Atrial septum: No defect or patent foramen ovale was identified   by color flow Doppler. - Tricuspid valve: There was mild regurgitation. - Pulmonary arteries: Systolic pressure was within the normal   range. PA peak pressure: 31 mm Hg (S).  10/04/17 ECHO:  - No significant change in aortic regurgitation severity since the   echo 09/2016. There is no diastolic flow reversal noted in the   descending aorta and the vena contracta is <0.6 cm, indicating   that it likely is not severe.  CT of abdomen: no liver cyst  Event monitor 12/24/2017:  Sinus rhythm with first-degree AV block at baseline.  Occasional premature atrial contractions and premature ventricular contractions.  No atrial fibrillation, no adverse arrhythmias, no pauses.  Palpitations are secondary to premature atrial contractions and premature ventricular contractions. Overall reassuring.  Please forward a copy to his primary physician, Dr. Evlyn Kanner as well as Dr. Matthias Hughs  Brain MRI: 01/2018 IMPRESSION: Advanced cerebral and cerebellar atrophy, extensive small vessel disease, numerous microbleeds and chronic lacunar infarcts as well as dolichoectasia of the proximal intracranial vessels, all consistent with longstanding hypertensive cerebrovascular disease.  No acute hemorrhage or acute infarct is evident.  Except for incidental RIGHT frontal venous angioma, no abnormal postcontrast enhancement.   Carotid ultrasound 01/2018  - mild B plaque.   ASSESSMENT:    1. Aortic valve insufficiency, etiology of cardiac valve disease unspecified      PLAN:  In order of problems listed above:  Aortic regurgitation -Moderate, normal left ventricular ejection fraction, asymptomatic.  Walking daily without any shortness of breath.  Echocardiogram 2019 February reviewed.  We should repeat.  Does have some observed apneic spells noted by  wife -If symptoms do occur, it may be time for aortic valve replacement.  We need  to watch for any reduction in his ejection fraction or dilation of his left ventricle.  Continue to encourage daily exercise.  Slurred speech transiently, transient leaning to the right when walking, question TIA - Appreciate neurology.  Continue with low-dose aspirin.  Brain MRI with chronic hypertensive vascular changes.  No acute infarct.  Old small lacunar infarcts noted.  No further progression.  Carotid Dopplers mild plaque bilaterally.  Continue to watch blood pressure.  Essential hypertension  -Has been on Spironolactone for quite some time.  -Blood pressure was elevated today.  His wife does state that it can often be elevated.  -It may not be a bad idea to have him on a angiotensin receptor blocker such as telmisartan if his blood pressures remain elevated.  I will forward this message to Dr. Evlyn Kanner.  First-degree AV block  -Stopped metoprolol at prior visit  -Event monitor in May 2019 was overall reassuring.    Medication Adjustments/Labs and Tests Ordered: Current medicines are reviewed at length with the patient today.  Concerns regarding medicines are outlined above.  Medication changes, Labs and Tests ordered today are listed in the Patient Instructions below. There are no Patient Instructions on file for this visit.   Signed, Donato Schultz, MD  05/20/2019 4:34 PM    [facility] [address] [place], [place], [place]  [address] Phone: [phone]; Fax: [phone]

## 2019-05-20 NOTE — Patient Instructions (Signed)
Medication Instructions:  The current medical regimen is effective;  continue present plan and medications.  If you need a refill on your cardiac medications before your next appointment, please call your pharmacy.   Testing/Procedures: Your physician has requested that you have an echocardiogram. Echocardiography is a painless test that uses sound waves to create images of your heart. It provides your doctor with information about the size and shape of your heart and how well your heart's chambers and valves are working. This procedure takes approximately one hour. There are no restrictions for this procedure.  Follow-Up: At [facility], you and your health needs are our priority.  As part of our continuing mission to provide you with exceptional heart care, we have created designated Provider Care Teams.  These Care Teams include your primary Cardiologist (physician) and Advanced Practice Providers (APPs -  Physician Assistants and Nurse Practitioners) who all work together to provide you with the care you need, when you need it. You will need a follow up appointment in 6 months.  Please call our office 2 months in advance to schedule this appointment.  You may see Dr Candee Furbish. or one of the following Advanced Practice Providers on your designated Care Team:   Truitt Merle, NP Cecilie Kicks, NP . Kathyrn Drown, NP  Thank you for choosing [facility]!!

## 2019-05-21 ENCOUNTER — Telehealth: Payer: Self-pay | Admitting: Cardiology

## 2019-05-21 NOTE — Telephone Encounter (Signed)
New message:    Patient states that some one called him and he is returning the call. Please call patient.

## 2019-05-21 NOTE — Telephone Encounter (Signed)
Neil Crouch called the pt and left him a message about scheduling his 2 D Echo.  Will route this call to her to reach back out to the pt.

## 2019-05-21 NOTE — Telephone Encounter (Signed)
I will route to Dr. Marlou Porch nurse Rexene Agent. RN as pt was seen yesterday by Dr. Marlou Porch. I am not sure if RN had tried to call the pt.

## 2019-05-25 ENCOUNTER — Other Ambulatory Visit: Payer: Self-pay

## 2019-05-25 ENCOUNTER — Ambulatory Visit (HOSPITAL_COMMUNITY): Payer: Medicare Other | Attending: Cardiology

## 2019-05-25 DIAGNOSIS — I351 Nonrheumatic aortic (valve) insufficiency: Secondary | ICD-10-CM | POA: Diagnosis not present

## 2019-05-27 ENCOUNTER — Ambulatory Visit: Payer: Medicare Other | Admitting: Neurology

## 2019-06-04 ENCOUNTER — Telehealth: Payer: Self-pay | Admitting: Cardiology

## 2019-06-04 NOTE — Telephone Encounter (Signed)
   To whom it may concern,  Please consider releasing Mr. Nicholas Hancock from his lease since he has been accepted to assisted living facility.  Thank you,   Candee Furbish, MD

## 2019-06-04 NOTE — Telephone Encounter (Signed)
Patient would like to know if he can get a letter to get out of his lease that ends in March, as he has contact to move into an assist living facility.

## 2019-06-04 NOTE — Telephone Encounter (Signed)
Patient's wife returned call, they would like for the letter to be emailed at [email or web address]

## 2019-06-04 NOTE — Telephone Encounter (Signed)
Left message for patient that Dr. Marlou Porch has written the letter that he has requested and asked him to call back to let us know how he would like to get the letter.

## 2019-06-05 NOTE — Telephone Encounter (Signed)
Follow up   Patient's daughter states that the need for the patient to move needs to be on the letter also. She states that the reason he is moving is  for mobility and cognitive issues. Please call to discuss.

## 2019-06-05 NOTE — Telephone Encounter (Signed)
Patient is returning call.  °

## 2019-06-08 ENCOUNTER — Telehealth: Payer: Self-pay | Admitting: *Deleted

## 2019-06-08 NOTE — Telephone Encounter (Signed)
Left message asking if pt has what he is needing regarding the letter he had requested.  Advised to c/b if further needs.

## 2019-06-08 NOTE — Telephone Encounter (Signed)
Pt's wife called back stating they need a letter address   To Whom It May Concern:   That patient is needing to break his lease to move to a living facility that offers more amenities such as physical therapy and meal preparation, etc. due to his current health situation.  Services that are not offered where he currently lives.   The letter they received before was not substantial enough to allow them to break their lease.  They would like it emailed to them at [email or web address]

## 2019-06-09 ENCOUNTER — Encounter: Payer: Self-pay | Admitting: *Deleted

## 2019-06-09 NOTE — Telephone Encounter (Signed)
Letter printed - taken to MR to be emailed to pt's home address as requested.

## 2019-06-09 NOTE — Telephone Encounter (Signed)
   To whom it may concern,  Mr. Nicholas Hancock is requesting for his lease terms to end since he has obtained alternate housing at a living facility that offers amenities such as physical therapy, meal preparation etc. which is beneficial to his overall wellbeing given his current health situation.  These services are not offered in his current living situation.  Thank you for consideration,  Candee Furbish, MD

## 2019-07-12 ENCOUNTER — Encounter (HOSPITAL_COMMUNITY): Payer: Self-pay | Admitting: Internal Medicine

## 2019-07-12 ENCOUNTER — Observation Stay (HOSPITAL_COMMUNITY): Payer: Medicare Other

## 2019-07-12 ENCOUNTER — Emergency Department (HOSPITAL_COMMUNITY): Payer: Medicare Other

## 2019-07-12 ENCOUNTER — Inpatient Hospital Stay (HOSPITAL_COMMUNITY)
Admission: EM | Admit: 2019-07-12 | Discharge: 2019-07-17 | DRG: 065 | Disposition: A | Discharge status: Rehabilitation Facility | Payer: Medicare Other | Attending: Internal Medicine | Admitting: Internal Medicine

## 2019-07-12 ENCOUNTER — Other Ambulatory Visit: Payer: Self-pay

## 2019-07-12 DIAGNOSIS — I6381 Other cerebral infarction due to occlusion or stenosis of small artery: Principal | ICD-10-CM | POA: Diagnosis present

## 2019-07-12 DIAGNOSIS — Z8249 Family history of ischemic heart disease and other diseases of the circulatory system: Secondary | ICD-10-CM

## 2019-07-12 DIAGNOSIS — I639 Cerebral infarction, unspecified: Secondary | ICD-10-CM

## 2019-07-12 DIAGNOSIS — Z823 Family history of stroke: Secondary | ICD-10-CM

## 2019-07-12 DIAGNOSIS — E559 Vitamin D deficiency, unspecified: Secondary | ICD-10-CM | POA: Diagnosis present

## 2019-07-12 DIAGNOSIS — I452 Bifascicular block: Secondary | ICD-10-CM | POA: Diagnosis present

## 2019-07-12 DIAGNOSIS — I5022 Chronic systolic (congestive) heart failure: Secondary | ICD-10-CM | POA: Diagnosis present

## 2019-07-12 DIAGNOSIS — Z809 Family history of malignant neoplasm, unspecified: Secondary | ICD-10-CM

## 2019-07-12 DIAGNOSIS — R4701 Aphasia: Secondary | ICD-10-CM | POA: Diagnosis present

## 2019-07-12 DIAGNOSIS — G459 Transient cerebral ischemic attack, unspecified: Secondary | ICD-10-CM | POA: Insufficient documentation

## 2019-07-12 DIAGNOSIS — I440 Atrioventricular block, first degree: Secondary | ICD-10-CM | POA: Diagnosis not present

## 2019-07-12 DIAGNOSIS — Z7982 Long term (current) use of aspirin: Secondary | ICD-10-CM

## 2019-07-12 DIAGNOSIS — E279 Disorder of adrenal gland, unspecified: Secondary | ICD-10-CM | POA: Diagnosis present

## 2019-07-12 DIAGNOSIS — Z833 Family history of diabetes mellitus: Secondary | ICD-10-CM

## 2019-07-12 DIAGNOSIS — R4182 Altered mental status, unspecified: Secondary | ICD-10-CM

## 2019-07-12 DIAGNOSIS — I471 Supraventricular tachycardia: Secondary | ICD-10-CM | POA: Diagnosis present

## 2019-07-12 DIAGNOSIS — R29810 Facial weakness: Secondary | ICD-10-CM | POA: Diagnosis present

## 2019-07-12 DIAGNOSIS — Z87891 Personal history of nicotine dependence: Secondary | ICD-10-CM

## 2019-07-12 DIAGNOSIS — R29704 NIHSS score 4: Secondary | ICD-10-CM | POA: Diagnosis present

## 2019-07-12 DIAGNOSIS — Z79899 Other long term (current) drug therapy: Secondary | ICD-10-CM

## 2019-07-12 DIAGNOSIS — E2740 Unspecified adrenocortical insufficiency: Secondary | ICD-10-CM | POA: Diagnosis present

## 2019-07-12 DIAGNOSIS — Z20828 Contact with and (suspected) exposure to other viral communicable diseases: Secondary | ICD-10-CM | POA: Diagnosis present

## 2019-07-12 DIAGNOSIS — E785 Hyperlipidemia, unspecified: Secondary | ICD-10-CM | POA: Diagnosis present

## 2019-07-12 DIAGNOSIS — I351 Nonrheumatic aortic (valve) insufficiency: Secondary | ICD-10-CM | POA: Diagnosis present

## 2019-07-12 DIAGNOSIS — I10 Essential (primary) hypertension: Secondary | ICD-10-CM | POA: Diagnosis present

## 2019-07-12 HISTORY — DX: Cerebral infarction, unspecified: I63.9

## 2019-07-12 HISTORY — DX: Nonrheumatic aortic (valve) insufficiency: I35.1

## 2019-07-12 HISTORY — DX: Atrioventricular block, first degree: I44.0

## 2019-07-12 LAB — COMPREHENSIVE METABOLIC PANEL
ALT: 13 U/L (ref 0–44)
AST: 17 U/L (ref 15–41)
Albumin: 3.5 g/dL (ref 3.5–5.0)
Alkaline Phosphatase: 68 U/L (ref 38–126)
Anion gap: 10 (ref 5–15)
BUN: 19 mg/dL (ref 8–23)
CO2: 26 mmol/L (ref 22–32)
Calcium: 8.8 mg/dL — ABNORMAL LOW (ref 8.9–10.3)
Chloride: 103 mmol/L (ref 98–111)
Creatinine, Ser: 1.2 mg/dL (ref 0.61–1.24)
GFR calc Af Amer: >60 mL/min (ref >60)
GFR calc non Af Amer: 56 mL/min — ABNORMAL LOW (ref >60)
Glucose, Bld: 146 mg/dL — ABNORMAL HIGH (ref 70–99)
Potassium: 3.5 mmol/L (ref 3.5–5.1)
Sodium: 139 mmol/L (ref 135–145)
Total Bilirubin: 1.2 mg/dL (ref 0.3–1.2)
Total Protein: 6.1 g/dL — ABNORMAL LOW (ref 6.5–8.1)

## 2019-07-12 LAB — DIFFERENTIAL
Abs Immature Granulocytes: 0.02 10*3/uL (ref 0.00–0.07)
Basophils Absolute: 0 10*3/uL (ref 0.0–0.1)
Basophils Relative: 0 %
Eosinophils Absolute: 0.3 10*3/uL (ref 0.0–0.5)
Eosinophils Relative: 4 %
Immature Granulocytes: 0 %
Lymphocytes Relative: 26 %
Lymphs Abs: 1.6 10*3/uL (ref 0.7–4.0)
Monocytes Absolute: 0.5 10*3/uL (ref 0.1–1.0)
Monocytes Relative: 9 %
Neutro Abs: 3.8 10*3/uL (ref 1.7–7.7)
Neutrophils Relative %: 61 %

## 2019-07-12 LAB — CBC
HCT: 39.7 % (ref 39.0–52.0)
Hemoglobin: 13.8 g/dL (ref 13.0–17.0)
MCH: 30.3 pg (ref 26.0–34.0)
MCHC: 34.8 g/dL (ref 30.0–36.0)
MCV: 87.3 fL (ref 80.0–100.0)
Platelets: 158 10*3/uL (ref 150–400)
RBC: 4.55 MIL/uL (ref 4.22–5.81)
RDW: 12.9 % (ref 11.5–15.5)
WBC: 6.3 10*3/uL (ref 4.0–10.5)
nRBC: 0 % (ref 0.0–0.2)

## 2019-07-12 LAB — URINALYSIS, ROUTINE W REFLEX MICROSCOPIC
Bilirubin Urine: NEGATIVE
Glucose, UA: NEGATIVE mg/dL
Hgb urine dipstick: NEGATIVE
Ketones, ur: NEGATIVE mg/dL
Leukocytes,Ua: NEGATIVE
Nitrite: NEGATIVE
Protein, ur: NEGATIVE mg/dL
Specific Gravity, Urine: 1.004 — ABNORMAL LOW (ref 1.005–1.030)
pH: 7 (ref 5.0–8.0)

## 2019-07-12 LAB — ETHANOL: Alcohol, Ethyl (B): <10 mg/dL (ref <10)

## 2019-07-12 LAB — RAPID URINE DRUG SCREEN, HOSP PERFORMED
Amphetamines: NOT DETECTED
Barbiturates: NOT DETECTED
Benzodiazepines: NOT DETECTED
Cocaine: NOT DETECTED
Opiates: NOT DETECTED
Tetrahydrocannabinol: NOT DETECTED

## 2019-07-12 LAB — PROTIME-INR
INR: 1.1 (ref 0.8–1.2)
Prothrombin Time: 14.4 seconds (ref 11.4–15.2)

## 2019-07-12 LAB — APTT: aPTT: 28 seconds (ref 24–36)

## 2019-07-12 MED ORDER — ATORVASTATIN CALCIUM 80 MG PO TABS
80.0000 mg | ORAL_TABLET | Freq: Every day | ORAL | Status: DC
  Administered 2019-07-12 – 2019-07-17 (×6): 80 mg via ORAL
  Filled 2019-07-12 (×6): qty 1

## 2019-07-12 MED ORDER — ASPIRIN 325 MG PO TABS
325.0000 mg | ORAL_TABLET | Freq: Every day | ORAL | Status: DC
  Administered 2019-07-12: 325 mg via ORAL
  Filled 2019-07-12 (×2): qty 1

## 2019-07-12 MED ORDER — SPIRONOLACTONE 25 MG PO TABS
50.0000 mg | ORAL_TABLET | Freq: Every day | ORAL | Status: DC
  Administered 2019-07-13 – 2019-07-15 (×3): 50 mg via ORAL
  Filled 2019-07-12 (×2): qty 2
  Filled 2019-07-12: qty 1
  Filled 2019-07-12: qty 2

## 2019-07-12 MED ORDER — VITAMIN D 25 MCG (1000 UNIT) PO TABS
1000.0000 [IU] | ORAL_TABLET | ORAL | Status: DC
  Administered 2019-07-12 – 2019-07-16 (×3): 1000 [IU] via ORAL
  Filled 2019-07-12 (×3): qty 1

## 2019-07-12 MED ORDER — ACETAMINOPHEN 325 MG PO TABS: 650.0000 mg | ORAL_TABLET | ORAL | Status: DC | PRN

## 2019-07-12 MED ORDER — STROKE: EARLY STAGES OF RECOVERY BOOK: Freq: Once | Status: DC

## 2019-07-12 MED ORDER — UBIQUINOL 100 MG PO CAPS: 1.0000 | ORAL_CAPSULE | Freq: Every day | ORAL | Status: DC

## 2019-07-12 MED ORDER — VITAMIN B-12 1000 MCG PO TABS
1000.0000 ug | ORAL_TABLET | Freq: Every day | ORAL | Status: DC
  Administered 2019-07-12 – 2019-07-17 (×6): 1000 ug via ORAL
  Filled 2019-07-12 (×6): qty 1

## 2019-07-12 MED ORDER — ASPIRIN 300 MG RE SUPP: 300.0000 mg | Freq: Every day | RECTAL | Status: DC

## 2019-07-12 MED ORDER — ACETAMINOPHEN 160 MG/5ML PO SOLN: 650.0000 mg | ORAL | Status: DC | PRN

## 2019-07-12 MED ORDER — ACETAMINOPHEN 650 MG RE SUPP: 650.0000 mg | RECTAL | Status: DC | PRN

## 2019-07-12 NOTE — ED Triage Notes (Addendum)
Per wife last known normal 1800 yesterday, difficulty finding his words.  Similar episode a year ago.  No other neurological deficits, wife thought he was joking with her and didn't called EMS yesterday.  Speech has persisted and stayed the same.  No hx of head trauma, seizure activity or blood thinner use.  Normal alert and oriented, ambulates, (I) ADL's.

## 2019-07-12 NOTE — ED Provider Notes (Signed)
[facility] EMERGENCY DEPARTMENT Provider Note   CSN: [id number] Arrival date & time: 07/12/19  1619     History   Chief Complaint No chief complaint on file.   HPI Nicholas Hancock is a 84 y.o. male.     Patient is an 84 year old male with a history of hypertension, arthritis who is presenting today with difficulty speaking.  He states yesterday his wife noticed his speech was not normal and it has continued today.  Patient describes it as stuttering and difficulty getting words out.  He denies any unilateral weakness, numbness, vision changes, difficulty walking, swallowing.  He has had no recent medication changes.  He has had no cough, chest pain, shortness of breath, abdominal pain, vomiting or diarrhea.  Patient states something similar happened in March of this year and he did follow-up outpatient with neurology but at that time all of his symptoms had resolved.  He had had a carotid ultrasound done last year that showed moderate disease as well as an MRI that showed diffuse vascular disease.  He did not have further imaging this year when the symptoms occurred as they had resolved other than some gait difficulties that he was can have physical therapy for.  The history is provided by the patient.    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 06/13/2012   Bilateral inguinal hernia-left>right 06/13/2012   Benign hypertensive heart disease without heart failure 05/22/2011    Past Surgical History:  Procedure Laterality Date   HAND SURGERY  09/21/1987   Enlarged mass on right  palm overlying right long finger metacarpophalangeal joint removed   INGUINAL HERNIA REPAIR  07/15/2012   Procedure: LAPAROSCOPIC BILATERAL INGUINAL HERNIA REPAIR;  Surgeon: Valarie Merino, MD;  Location: [facility];  Service: General;  Laterality: Bilateral;  Laparoscopic Bilateral Inguinal Hernia Repairs with Mesh   left knee arthroscopy  3 years ago   PELVIC FRACTURE SURGERY     chippped in high school   TONSILLECTOMY AND ADENOIDECTOMY          Home Medications    Prior to Admission medications   Medication Sig Start Date End Date Taking? Authorizing Provider  aspirin EC 81 MG tablet Take 81 mg by mouth daily.    [provider]  atorvastatin (LIPITOR) 80 MG tablet Take 1 tablet (80 mg total) by mouth daily. 10/27/18   [name], DO  Calcium 167 MG CAPS Take 1 capsule by mouth daily.    [provider]  Cholecalciferol (VITAMIN D-3) 5000 UNITS TABS Take 1 tablet by mouth every other day.    [provider]  Flaxseed, Linseed, (FLAX SEEDS PO) Take 1 tablet by mouth daily.    [provider]  Multiple Vitamin (MULTIVITAMIN WITH MINERALS) TABS Take 1 tablet by mouth daily.    [provider]  Omega-3 Fatty Acids (SALMON OIL-1000 PO) Take 1,000 mg by mouth daily.    [provider]  spironolactone (ALDACTONE) 50 MG tablet TAKE 1 TABLET BY MOUTH EVERY DAY BEFORE BREAKFAST 10/02/18   Skains,  Veverly Fells, MD  Ubiquinol 100 MG CAPS Take 1 capsule by mouth daily.    [provider]  vitamin B-12 (CYANOCOBALAMIN) 1000 MCG tablet Take 1,000 mcg by mouth daily.    [provider]  vitamin C (ASCORBIC ACID) 500 MG tablet Take 500 mg by mouth daily.    [provider]    Family History Family History  Problem Relation Age of Onset   Stroke Father    Diabetes Father        `   Cancer Father        unknown   Heart disease Mother    Cancer Mother        unknown    Social History Social History    Tobacco Use   Smoking status: Former Smoker    Years: 20.00    Types: Pipe    Quit date: 05/20/1985    Years since quitting: 34.1   Smokeless tobacco: Never Used  Substance Use Topics   Alcohol use: Yes    Comment: approx 4 beers a week   Drug use: No     Allergies   Patient has no known allergies.   Review of Systems Review of Systems  All other systems reviewed and are negative.    Physical Exam Updated Vital Signs BP (!) 200/106 (BP Location: Right Arm)    Pulse 69    Temp 98.7 F (37.1 C) (Oral)    Resp 20    Ht 5\' 8"  (1.727 m)    Wt 72.6 kg    SpO2 97%    BMI 24.33 kg/m   Physical Exam Vitals signs and nursing note reviewed.  Constitutional:      General: He is not in acute distress.    Appearance: He is well-developed and normal weight.  HENT:     Head: Normocephalic and atraumatic.  Eyes:     General: No visual field deficit.    Conjunctiva/sclera: Conjunctivae normal.     Pupils: Pupils are equal, round, and reactive to light.  Neck:     Musculoskeletal: Normal range of motion and neck supple.  Cardiovascular:     Rate and Rhythm: Normal rate and regular rhythm.     Pulses: Normal pulses.     Heart sounds: Murmur present. Systolic murmur present with a grade of 3/6.  Pulmonary:     Effort: Pulmonary effort is normal. No respiratory distress.     Breath sounds: Normal breath sounds. No wheezing or rales.  Abdominal:     General: There is no distension.     Palpations: Abdomen is soft.     Tenderness: There is no abdominal tenderness. There is no guarding or rebound.  Musculoskeletal: Normal range of motion.        General: No tenderness.  Skin:    General: Skin is warm and dry.     Findings: No erythema or rash.  Neurological:     Mental Status: He is alert.     Cranial Nerves: No facial asymmetry.     Sensory: Sensation is intact.     Motor: Motor function is intact. No pronator drift.     Coordination: Coordination is intact.  Finger-Nose-Finger Test and Heel to [place] Test normal.     Comments: Oriented to person and place but cannot recall the year, the month or his age.  Unable to say no ifs, ands or buts.  Repetitive stuttering and mild aphasia  Psychiatric:  Mood and Affect: Mood normal.        Behavior: Behavior normal.        Thought Content: Thought content normal.      ED Treatments / Results  Labs (all labs ordered are listed, but only abnormal results are displayed) Labs Reviewed  COMPREHENSIVE METABOLIC PANEL - Abnormal; Notable for the following components:      Result Value   Glucose, Bld 146 (*)    Calcium 8.8 (*)    Total Protein 6.1 (*)    GFR calc non Af Amer 56 (*)    All other components within normal limits  ETHANOL  PROTIME-INR  APTT  CBC  DIFFERENTIAL  RAPID URINE DRUG SCREEN, HOSP PERFORMED  URINALYSIS, ROUTINE W REFLEX MICROSCOPIC    EKG EKG Interpretation  Date/Time:  Sunday July 12 2019 17:16:53 EST Ventricular Rate:  68 PR Interval:    QRS Duration: 122 QT Interval:  465 QTC Calculation: 495 R Axis:   -52 Text Interpretation: Sinus rhythm Prolonged PR interval RBBB and LAFB No significant change since last tracing Confirmed by Blanchie Dessert [phone]) on 07/12/2019 5:53:36 PM   Radiology Ct Head Wo Contrast  Result Date: 07/12/2019 CLINICAL DATA:  Patient with difficulty finding words. EXAM: CT HEAD WITHOUT CONTRAST TECHNIQUE: Contiguous axial images were obtained from the base of the skull through the vertex without intravenous contrast. COMPARISON:  MRI brain 01/17/2018 FINDINGS: Brain: Ventricles and sulci are prominent compatible with atrophy. Extensive periventricular and subcortical white matter hypodensities compatible with chronic microvascular ischemic changes. No evidence for acute cortically based infarct, intracranial hemorrhage, mass lesion or mass-effect. Bilateral chronic basal ganglia lacunar infarcts. Vascular: Unremarkable Skull: Intact.  Sinuses/Orbits: Polypoid mucosal thickening within the maxillary sinuses bilaterally. Mucosal thickening involving the ethmoid air cells. Mastoid air cells are unremarkable. Orbits are unremarkable. Other: None. IMPRESSION: No acute intracranial process. Atrophy and chronic microvascular ischemic changes. Electronically Signed   By: Lovey Newcomer M.D.   On: 07/12/2019 18:07    Procedures Procedures (including critical care time)  Medications Ordered in ED Medications - No data to display   Initial Impression / Assessment and Plan / ED Course  I have reviewed the triage vital signs and the nursing notes.  Pertinent labs & imaging results that were available during my care of the patient were reviewed by me and considered in my medical decision making (see chart for details).       Elderly male presenting today with aphasia concerning for stroke.  Stroke scale is a 3.  He is in no acute distress but is hypertensive here.  He states he has taken his medications today.  Patient has similar episode in March of this year and did follow-up with neurology but did not have further imaging at that time as he had had an MRI the year before and his symptoms had resolved.  Patient is not in the window for TPA and has a low stroke score.  Symptoms started yesterday.  Stroke work-up initiated.  7:15 PM Initial labs are reassuring, head CT is negative.  Spoke with Dr. Lorraine Lax and he recommended MR MRA and admission for further evaluation.  Will admit to the hospitalist service.  Final Clinical Impressions(s) / ED Diagnoses   Final diagnoses:  Cerebrovascular accident (CVA), unspecified mechanism [facility])    ED Discharge Orders    None       Blanchie Dessert, MD 07/12/19 [phone]

## 2019-07-12 NOTE — ED Notes (Signed)
[name]  [phone]  She's the wife please call with an update

## 2019-07-12 NOTE — ED Triage Notes (Signed)
Pt does have difficulty in answering questions, unsure of the date.  States he has been slurring his words with no other sx. No pain, N/V/D.

## 2019-07-12 NOTE — H&P (Signed)
TRH H&P    Patient Demographics:    Nicholas Hancock, is a 84 y.o. male  MRN: [id number]  DOB - 1935/07/31  Admit Date - 07/12/2019  Referring MD/NP/PA: Gwyneth Sprout  Outpatient Primary MD for the patient is Adrian Prince, MD  Patient coming from:  home  Chief complaint-   Difficulty w word finding   HPI:    Nicholas Hancock  is a 84 y.o. male,  w hypertension, hyperaldosteronism, moderate aortic regurgitation, elevated psa, vitamin D deficiency, apparently presents with c/o difficulty with word finding per his wife. Pt states he is not sure when this started. Per RN staff note started 1800 07/11/19.  Pt denies headache, vision change, cough, cp, palp, sob, n/v, abd pain, diarrhea, brbpr, dysuria, focal numbness, tingling, weakness, slurred speech.    In ED,  T 98.7 P 73 R 18, Bp 218/104,  Pox 99% on RA Wt 72.6kg  ekg nsr at 70, lad, RBBB, LAFB, t inversion v6  CT brain  IMPRESSION: No acute intracranial process.  Atrophy and chronic microvascular ischemic changes.  Na 139, K 3.5,  Bun 19, Creatinine 1.2 Ast 17, Alt 13 Wbc 6.3, hgb 13.8, Plt 158 Inr 1.1 Etoh <10  Pt will be admitted for TIA   Review of systems:    In addition to the HPI above,  No Fever-chills, No Headache, No changes with Vision or hearing, No problems swallowing food or Liquids, No Chest pain, Cough or Shortness of Breath, No Abdominal pain, No Nausea or Vomiting, bowel movements are regular, No Blood in stool or Urine, No dysuria, No new skin rashes or bruises, No new joints pains-aches,  No new weakness, tingling, numbness in any extremity, No recent weight gain or loss, No polyuria, polydypsia or polyphagia, No significant Mental Stressors.  All other systems reviewed and are negative.    Past History of the following :    Past Medical History:  Diagnosis Date  . 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 Arthritis    hands  . Elevated PSA    monitored by Dr. Earlene Plater  . Hypertension    takes diuretic for adrenal gland dysfunction, BP will be high without diuretic  . Vitamin D deficiency       Past Surgical History:  Procedure Laterality Date  . HAND SURGERY  09/21/1987   Enlarged mass on right palm overlying right long finger metacarpophalangeal joint removed  . INGUINAL HERNIA REPAIR  07/15/2012   Procedure: LAPAROSCOPIC BILATERAL INGUINAL HERNIA REPAIR;  Surgeon: Valarie Merino, MD;  Location: [facility];  Service: General;  Laterality: Bilateral;  Laparoscopic Bilateral Inguinal Hernia Repairs with Mesh  . left knee arthroscopy  3 years ago  . PELVIC FRACTURE SURGERY     chippped in high school  . TONSILLECTOMY AND ADENOIDECTOMY        Social History:      Social History   Tobacco Use  . Smoking status: Former Smoker    Years: 20.00  Types: Pipe    Quit date: 05/20/1985    Years since quitting: 34.1  . Smokeless tobacco: Never Used  Substance Use Topics  . Alcohol use: Yes    Comment: approx 4 beers a week       Family History :     Family History  Problem Relation Age of Onset  . Stroke Father   . Diabetes Father        `  . Cancer Father        unknown  . Heart disease Mother   . Cancer Mother        unknown       Home Medications:   Prior to Admission medications   Medication Sig Start Date End Date Taking? Authorizing Provider  aspirin EC 81 MG tablet Take 81 mg by mouth daily.    [provider]  atorvastatin (LIPITOR) 80 MG tablet Take 1 tablet (80 mg total) by mouth daily. 10/27/18   [name], DO  Calcium 167 MG CAPS Take 1 capsule by mouth daily.    [provider]  Cholecalciferol (VITAMIN D-3) 5000 UNITS TABS Take 1 tablet by mouth every other day.    [provider]  Flaxseed, Linseed, (FLAX SEEDS PO) Take 1 tablet by mouth daily.     [provider]  Multiple Vitamin (MULTIVITAMIN WITH MINERALS) TABS Take 1 tablet by mouth daily.    [provider]  Omega-3 Fatty Acids (SALMON OIL-1000 PO) Take 1,000 mg by mouth daily.    [provider]  spironolactone (ALDACTONE) 50 MG tablet TAKE 1 TABLET BY MOUTH EVERY DAY BEFORE BREAKFAST 10/02/18   Jake Bathe, MD  Ubiquinol 100 MG CAPS Take 1 capsule by mouth daily.    [provider]  vitamin B-12 (CYANOCOBALAMIN) 1000 MCG tablet Take 1,000 mcg by mouth daily.    [provider]  vitamin C (ASCORBIC ACID) 500 MG tablet Take 500 mg by mouth daily.    [provider]     Allergies:    No Known Allergies   Physical Exam:   Vitals  Blood pressure (!) 167/70, pulse 62, temperature 98.7 F (37.1 C), temperature source Oral, resp. rate (!) 21, height 5\' 8"  (1.727 m), weight 72.6 kg, SpO2 92 %.  1.  General: axoxo3  2. Psychiatric: euthymic  3. Neurologic: cn2-12 intact, reflexes 2+ symmetric, diffuse with down going toes bilaterally, motor 5/5 in all ext, no pronator drift, pt appears to have slight difficulty with word finding, comprehension is good, speech fluent  4. HEENMT:  Anicteric, pupils 1.44mm symmetric, direct, consensual, near intact Neck: no jvd  5. Respiratory : CTAB  6. Cardiovascular : rrr s1, s2, 2/6 dm rusb  7. Gastrointestinal:  Abd: soft, nt, nd, +bs  8. Skin:  Ext: no c/c/e, no rash  9.Musculoskeletal:  Good ROM    Data Review:    CBC Recent Labs  Lab 07/12/19 1712  WBC 6.3  HGB 13.8  HCT 39.7  PLT 158  MCV 87.3  MCH 30.3  MCHC 34.8  RDW 12.9  LYMPHSABS 1.6  MONOABS 0.5  EOSABS 0.3  BASOSABS 0.0   ------------------------------------------------------------------------------------------------------------------  Results for orders placed or performed during the hospital encounter of 07/12/19 (from the past 48 hour(s))  Ethanol     Status: None   Collection Time:  07/12/19  5:12 PM  Result Value Ref Range   Alcohol, Ethyl (B) <10 <10 mg/dL    Comment: (NOTE) Lowest detectable  limit for serum alcohol is 10 mg/dL. For medical purposes only. Performed at [facility], [address] [place], [place] [address]   Protime-INR     Status: None   Collection Time: 07/12/19  5:12 PM  Result Value Ref Range   Prothrombin Time 14.4 11.4 - 15.2 seconds   INR 1.1 0.8 - 1.2    Comment: (NOTE) INR goal varies based on device and disease states. Performed at [facility], [address] [place], [place] [address]   APTT     Status: None   Collection Time: 07/12/19  5:12 PM  Result Value Ref Range   aPTT 28 24 - 36 seconds    Comment: Performed at [facility], [address] [place], [place] [address]  CBC     Status: None   Collection Time: 07/12/19  5:12 PM  Result Value Ref Range   WBC 6.3 4.0 - 10.5 K/uL   RBC 4.55 4.22 - 5.81 MIL/uL   Hemoglobin 13.8 13.0 - 17.0 g/dL   HCT 47.839.7 29.539.0 - 62.152.0 %   MCV 87.3 80.0 - 100.0 fL   MCH 30.3 26.0 - 34.0 pg   MCHC 34.8 30.0 - 36.0 g/dL   RDW 30.812.9 65.711.5 - 84.615.5 %   Platelets 158 150 - 400 K/uL   nRBC 0.0 0.0 - 0.2 %    Comment: Performed at [facility], [address] [place], [place] [address]  Differential     Status: None   Collection Time: 07/12/19  5:12 PM  Result Value Ref Range   Neutrophils Relative % 61 %   Neutro Abs 3.8 1.7 - 7.7 K/uL   Lymphocytes Relative 26 %   Lymphs Abs 1.6 0.7 - 4.0 K/uL   Monocytes Relative 9 %   Monocytes Absolute 0.5 0.1 - 1.0 K/uL   Eosinophils Relative 4 %   Eosinophils Absolute 0.3 0.0 - 0.5 K/uL   Basophils Relative 0 %   Basophils Absolute 0.0 0.0 - 0.1 K/uL   Immature Granulocytes 0 %   Abs Immature Granulocytes 0.02 0.00 - 0.07 K/uL    Comment: Performed at [facility], [address] [place], [place] [address]  Comprehensive metabolic panel     Status: Abnormal   Collection Time: 07/12/19  5:12 PM  Result Value Ref  Range   Sodium 139 135 - 145 mmol/L   Potassium 3.5 3.5 - 5.1 mmol/L   Chloride 103 98 - 111 mmol/L   CO2 26 22 - 32 mmol/L   Glucose, Bld 146 (H) 70 - 99 mg/dL   BUN 19 8 - 23 mg/dL   Creatinine, Ser 2.441.20 0.61 - 1.24 mg/dL   Calcium 8.8 (L) 8.9 - 10.3 mg/dL   Total Protein 6.1 (L) 6.5 - 8.1 g/dL   Albumin 3.5 3.5 - 5.0 g/dL   AST 17 15 - 41 U/L   ALT 13 0 - 44 U/L   Alkaline Phosphatase 68 38 - 126 U/L   Total Bilirubin 1.2 0.3 - 1.2 mg/dL   GFR calc non Af Amer 56 (L) >60 mL/min   GFR calc Af Amer >60 >60 mL/min   Anion gap 10 5 - 15    Comment: Performed at [facility], [address] [place], [place] [address]    Chemistries  Recent Labs  Lab 07/12/19 1712  NA 139  K 3.5  CL 103  CO2 26  GLUCOSE 146*  BUN 19  CREATININE 1.20  CALCIUM 8.8*  AST 17  ALT 13  ALKPHOS 68  BILITOT 1.2   ------------------------------------------------------------------------------------------------------------------  ------------------------------------------------------------------------------------------------------------------ GFR: Estimated Creatinine Clearance: 45.9 mL/min (by C-G formula based on SCr of 1.2 mg/dL). Liver Function Tests: Recent Labs  Lab 07/12/19 1712  AST 17  ALT 13  ALKPHOS 68  BILITOT 1.2  PROT 6.1*  ALBUMIN 3.5   No results for input(s): LIPASE, AMYLASE in the last 168 hours. No results for input(s): AMMONIA in the last 168 hours. Coagulation Profile: Recent Labs  Lab 07/12/19 1712  INR 1.1   Cardiac Enzymes: No results for input(s): CKTOTAL, CKMB, CKMBINDEX, TROPONINI in the last 168 hours. BNP (last 3 results) No results for input(s): PROBNP in the last 8760 hours. HbA1C: No results for input(s): HGBA1C in the last 72 hours. CBG: No results for input(s): GLUCAP in the last 168 hours. Lipid Profile: No results for input(s): CHOL, HDL, LDLCALC, TRIG, CHOLHDL, LDLDIRECT in the last 72 hours. Thyroid Function Tests: No results for  input(s): TSH, T4TOTAL, FREET4, T3FREE, THYROIDAB in the last 72 hours. Anemia Panel: No results for input(s): VITAMINB12, FOLATE, FERRITIN, TIBC, IRON, RETICCTPCT in the last 72 hours.  --------------------------------------------------------------------------------------------------------------- Urine analysis: No results found for: COLORURINE, APPEARANCEUR, LABSPEC, PHURINE, GLUCOSEU, HGBUR, BILIRUBINUR, KETONESUR, PROTEINUR, UROBILINOGEN, NITRITE, LEUKOCYTESUR    Imaging Results:    Ct Head Wo Contrast  Result Date: 07/12/2019 CLINICAL DATA:  Patient with difficulty finding words. EXAM: CT HEAD WITHOUT CONTRAST TECHNIQUE: Contiguous axial images were obtained from the base of the skull through the vertex without intravenous contrast. COMPARISON:  MRI brain 01/17/2018 FINDINGS: Brain: Ventricles and sulci are prominent compatible with atrophy. Extensive periventricular and subcortical white matter hypodensities compatible with chronic microvascular ischemic changes. No evidence for acute cortically based infarct, intracranial hemorrhage, mass lesion or mass-effect. Bilateral chronic basal ganglia lacunar infarcts. Vascular: Unremarkable Skull: Intact. Sinuses/Orbits: Polypoid mucosal thickening within the maxillary sinuses bilaterally. Mucosal thickening involving the ethmoid air cells. Mastoid air cells are unremarkable. Orbits are unremarkable. Other: None. IMPRESSION: No acute intracranial process. Atrophy and chronic microvascular ischemic changes. Electronically Signed   By: Lovey Newcomer M.D.   On: 07/12/2019 18:07       Assessment & Plan:    Principal Problem:   TIA (transient ischemic attack)  TIA MRI/ MRA brain Carotid ultrasound Cardiac echo Check hga1c, lipid PT/OT/Speech therapy Neurology consulted by ED Aspirin Lipitor 80mg  po qhs  Hypertension Cont Spironolactone 50mg  po qday     DVT Prophylaxis-  SCDs   AM Labs Ordered, also please review Full Orders  Family  Communication: Admission, patients condition and plan of care including tests being ordered have been discussed with the patient  who indicate understanding and agree with the plan and Code Status.  Code Status:  FULL CODE per patient.    Admission status: Observation: Based on patients clinical presentation and evaluation of above clinical data, I have made determination that patient meets observation criteria at this time. Pt may require inpatient admission if has stroke on MRI  Time spent in minutes : 55 minutes   Jani Gravel M.D on 07/12/2019 at 7:30 PM

## 2019-07-12 NOTE — ED Notes (Signed)
Admitting paged to RN per his request  

## 2019-07-13 ENCOUNTER — Observation Stay (HOSPITAL_COMMUNITY): Payer: Medicare Other

## 2019-07-13 ENCOUNTER — Inpatient Hospital Stay (HOSPITAL_COMMUNITY): Payer: Medicare Other

## 2019-07-13 ENCOUNTER — Other Ambulatory Visit: Payer: Self-pay

## 2019-07-13 ENCOUNTER — Encounter (HOSPITAL_COMMUNITY): Payer: Self-pay | Admitting: Internal Medicine

## 2019-07-13 DIAGNOSIS — I440 Atrioventricular block, first degree: Secondary | ICD-10-CM | POA: Diagnosis present

## 2019-07-13 DIAGNOSIS — E559 Vitamin D deficiency, unspecified: Secondary | ICD-10-CM

## 2019-07-13 DIAGNOSIS — G459 Transient cerebral ischemic attack, unspecified: Secondary | ICD-10-CM

## 2019-07-13 DIAGNOSIS — I361 Nonrheumatic tricuspid (valve) insufficiency: Secondary | ICD-10-CM | POA: Diagnosis not present

## 2019-07-13 DIAGNOSIS — I340 Nonrheumatic mitral (valve) insufficiency: Secondary | ICD-10-CM | POA: Diagnosis not present

## 2019-07-13 DIAGNOSIS — I10 Essential (primary) hypertension: Secondary | ICD-10-CM | POA: Diagnosis not present

## 2019-07-13 DIAGNOSIS — Z823 Family history of stroke: Secondary | ICD-10-CM | POA: Diagnosis not present

## 2019-07-13 DIAGNOSIS — Z833 Family history of diabetes mellitus: Secondary | ICD-10-CM | POA: Diagnosis not present

## 2019-07-13 DIAGNOSIS — I471 Supraventricular tachycardia: Secondary | ICD-10-CM | POA: Diagnosis present

## 2019-07-13 DIAGNOSIS — I5021 Acute systolic (congestive) heart failure: Secondary | ICD-10-CM | POA: Diagnosis not present

## 2019-07-13 DIAGNOSIS — I351 Nonrheumatic aortic (valve) insufficiency: Secondary | ICD-10-CM

## 2019-07-13 DIAGNOSIS — I6381 Other cerebral infarction due to occlusion or stenosis of small artery: Secondary | ICD-10-CM | POA: Diagnosis present

## 2019-07-13 DIAGNOSIS — R29810 Facial weakness: Secondary | ICD-10-CM | POA: Diagnosis present

## 2019-07-13 DIAGNOSIS — I639 Cerebral infarction, unspecified: Secondary | ICD-10-CM | POA: Diagnosis not present

## 2019-07-13 DIAGNOSIS — Z87891 Personal history of nicotine dependence: Secondary | ICD-10-CM | POA: Diagnosis not present

## 2019-07-13 DIAGNOSIS — E279 Disorder of adrenal gland, unspecified: Secondary | ICD-10-CM | POA: Diagnosis not present

## 2019-07-13 DIAGNOSIS — I452 Bifascicular block: Secondary | ICD-10-CM | POA: Diagnosis present

## 2019-07-13 DIAGNOSIS — E876 Hypokalemia: Secondary | ICD-10-CM | POA: Diagnosis not present

## 2019-07-13 DIAGNOSIS — R944 Abnormal results of kidney function studies: Secondary | ICD-10-CM | POA: Diagnosis not present

## 2019-07-13 DIAGNOSIS — Z7982 Long term (current) use of aspirin: Secondary | ICD-10-CM | POA: Diagnosis not present

## 2019-07-13 DIAGNOSIS — Z20828 Contact with and (suspected) exposure to other viral communicable diseases: Secondary | ICD-10-CM | POA: Diagnosis present

## 2019-07-13 DIAGNOSIS — Z809 Family history of malignant neoplasm, unspecified: Secondary | ICD-10-CM | POA: Diagnosis not present

## 2019-07-13 DIAGNOSIS — Z8249 Family history of ischemic heart disease and other diseases of the circulatory system: Secondary | ICD-10-CM | POA: Diagnosis not present

## 2019-07-13 DIAGNOSIS — I429 Cardiomyopathy, unspecified: Secondary | ICD-10-CM | POA: Diagnosis not present

## 2019-07-13 DIAGNOSIS — R350 Frequency of micturition: Secondary | ICD-10-CM | POA: Diagnosis not present

## 2019-07-13 DIAGNOSIS — I69320 Aphasia following cerebral infarction: Secondary | ICD-10-CM | POA: Diagnosis not present

## 2019-07-13 DIAGNOSIS — Z79899 Other long term (current) drug therapy: Secondary | ICD-10-CM | POA: Diagnosis not present

## 2019-07-13 DIAGNOSIS — R4587 Impulsiveness: Secondary | ICD-10-CM | POA: Diagnosis not present

## 2019-07-13 DIAGNOSIS — E2740 Unspecified adrenocortical insufficiency: Secondary | ICD-10-CM | POA: Diagnosis present

## 2019-07-13 DIAGNOSIS — I680 Cerebral amyloid angiopathy: Secondary | ICD-10-CM

## 2019-07-13 DIAGNOSIS — R29704 NIHSS score 4: Secondary | ICD-10-CM | POA: Diagnosis present

## 2019-07-13 DIAGNOSIS — I5022 Chronic systolic (congestive) heart failure: Secondary | ICD-10-CM | POA: Diagnosis present

## 2019-07-13 DIAGNOSIS — E785 Hyperlipidemia, unspecified: Secondary | ICD-10-CM | POA: Diagnosis present

## 2019-07-13 DIAGNOSIS — R4701 Aphasia: Secondary | ICD-10-CM | POA: Diagnosis present

## 2019-07-13 LAB — CBC
HCT: 42.3 % (ref 39.0–52.0)
Hemoglobin: 14.8 g/dL (ref 13.0–17.0)
MCH: 30.8 pg (ref 26.0–34.0)
MCHC: 35 g/dL (ref 30.0–36.0)
MCV: 88.1 fL (ref 80.0–100.0)
Platelets: 161 K/uL (ref 150–400)
RBC: 4.8 MIL/uL (ref 4.22–5.81)
RDW: 12.7 % (ref 11.5–15.5)
WBC: 7 K/uL (ref 4.0–10.5)
nRBC: 0 % (ref 0.0–0.2)

## 2019-07-13 LAB — LIPID PANEL
Cholesterol: 226 mg/dL — ABNORMAL HIGH (ref 0–200)
HDL: 31 mg/dL — ABNORMAL LOW (ref >40)
LDL Cholesterol: 179 mg/dL — ABNORMAL HIGH (ref 0–99)
Total CHOL/HDL Ratio: 7.3 RATIO
Triglycerides: 79 mg/dL (ref <150)
VLDL: 16 mg/dL (ref 0–40)

## 2019-07-13 LAB — COMPREHENSIVE METABOLIC PANEL
ALT: 13 U/L (ref 0–44)
AST: 15 U/L (ref 15–41)
Albumin: 3.6 g/dL (ref 3.5–5.0)
Alkaline Phosphatase: 67 U/L (ref 38–126)
Anion gap: 9 (ref 5–15)
BUN: 16 mg/dL (ref 8–23)
CO2: 26 mmol/L (ref 22–32)
Calcium: 8.9 mg/dL (ref 8.9–10.3)
Chloride: 106 mmol/L (ref 98–111)
Creatinine, Ser: 1.17 mg/dL (ref 0.61–1.24)
GFR calc Af Amer: >60 mL/min (ref >60)
GFR calc non Af Amer: 58 mL/min — ABNORMAL LOW (ref >60)
Glucose, Bld: 115 mg/dL — ABNORMAL HIGH (ref 70–99)
Potassium: 3.3 mmol/L — ABNORMAL LOW (ref 3.5–5.1)
Sodium: 141 mmol/L (ref 135–145)
Total Bilirubin: 1.3 mg/dL — ABNORMAL HIGH (ref 0.3–1.2)
Total Protein: 6.4 g/dL — ABNORMAL LOW (ref 6.5–8.1)

## 2019-07-13 LAB — ECHOCARDIOGRAM LIMITED
Height: 68 in
Weight: 2560 [oz_av]

## 2019-07-13 LAB — HEMOGLOBIN A1C
Hgb A1c MFr Bld: 5.9 % — ABNORMAL HIGH (ref 4.8–5.6)
Mean Plasma Glucose: 122.63 mg/dL

## 2019-07-13 LAB — SARS CORONAVIRUS 2 (TAT 6-24 HRS): SARS Coronavirus 2: NEGATIVE

## 2019-07-13 MED ORDER — POTASSIUM CHLORIDE CRYS ER 20 MEQ PO TBCR
40.0000 meq | EXTENDED_RELEASE_TABLET | Freq: Once | ORAL | Status: AC
  Administered 2019-07-13: 40 meq via ORAL
  Filled 2019-07-13: qty 2

## 2019-07-13 MED ORDER — CLOPIDOGREL BISULFATE 75 MG PO TABS
75.0000 mg | ORAL_TABLET | Freq: Every day | ORAL | Status: DC
  Administered 2019-07-13 – 2019-07-14 (×2): 75 mg via ORAL
  Filled 2019-07-13 (×2): qty 1

## 2019-07-13 MED ORDER — ASPIRIN EC 81 MG PO TBEC
81.0000 mg | DELAYED_RELEASE_TABLET | Freq: Every day | ORAL | Status: DC
  Administered 2019-07-13 – 2019-07-17 (×5): 81 mg via ORAL
  Filled 2019-07-13 (×5): qty 1

## 2019-07-13 MED ORDER — CLOPIDOGREL BISULFATE 75 MG PO TABS
75.0000 mg | ORAL_TABLET | Freq: Every day | ORAL | Status: DC
  Filled 2019-07-13: qty 1

## 2019-07-13 MED ORDER — METOPROLOL TARTRATE 12.5 MG HALF TABLET: 12.5000 mg | ORAL_TABLET | Freq: Four times a day (QID) | ORAL | Status: AC | PRN

## 2019-07-13 NOTE — Consult Note (Signed)
Requesting Physician: Dr. Maryan Rued    Chief Complaint: Aphasia  History obtained from: Patient and Chart   HPI:                                                                                                                                       Nicholas Hancock is a 84 y.o. male with past medical history significant for hypertension, adrenal gland dysfunction, prior history of TIA, former smoker presents to the emergency department with difficulty with language that began yesterday evening and persisted today morning.  Patient denies any other symptoms such as weakness or numbness, slurred speech, vision changes or difficulty walking.  He does have a prior history of transient slurred speech, possible facial symmetry and leaning to the right that was evaluated as an outpatient with a MRI brain which showed chronic small vessel disease and a right frontal venous angioma.  He also has had a EEG in the past in May which was normal.  Patient was placed on aspirin and statin was increased to 80 mg in March 2020 please recommended by his neurologist Dr. Tomi Likens. He also has had a cardiac event monitor placed.  In the emergency department, patient continues to have mild aphasia.  Stat CT head was obtained which did not show any acute findings.  Neurology was consulted for suspected stroke.   Date last known well: 11.20.20 tPA Given: Outside TPA window NIHSS: 1 Baseline MRS 0  Past Medical History:  Diagnosis Date  . Adrenal gland dysfunction ([place])    hyperaldesteroa- takes diuretic for over production of aldosterone  . Arthritis    hands  . Elevated PSA    monitored by Dr. Rosana Hoes  . Hypertension    takes diuretic for adrenal gland dysfunction, BP will be high without diuretic  . Vitamin D deficiency     Past Surgical History:  Procedure Laterality Date  . HAND SURGERY  09/21/1987   Enlarged mass on right palm overlying right long finger metacarpophalangeal joint removed  . INGUINAL HERNIA  REPAIR  07/15/2012   Procedure: LAPAROSCOPIC BILATERAL INGUINAL HERNIA REPAIR;  Surgeon: Pedro Earls, MD;  Location: [facility];  Service: General;  Laterality: Bilateral;  Laparoscopic Bilateral Inguinal Hernia Repairs with Mesh  . left knee arthroscopy  3 years ago  . PELVIC FRACTURE SURGERY     chippped in high school  . TONSILLECTOMY AND ADENOIDECTOMY      Family History  Problem Relation Age of Onset  . Stroke Father   . Diabetes Father        `  . Cancer Father        unknown  . Heart disease Mother   . Cancer Mother        unknown   Social History:  reports that he quit smoking about 34 years ago. His smoking use included pipe. He quit after 20.00 years of use. He has  never used smokeless tobacco. He reports current alcohol use. He reports that he does not use drugs.  Allergies: No Known Allergies  Medications:                                                                                                                        I reviewed home medications.  Patient is on aspirin and statin,compliant.   ROS:                                                                                                                                     14 systems reviewed and negative except above    Examination:                                                                                                      General: Appears well-developed  Psych: Affect appropriate to situation Eyes: No scleral injection HENT: No OP obstrucion Head: Normocephalic.  Cardiovascular: Normal rate and regular rhythm.  Respiratory: Effort normal and breath sounds normal to anterior ascultation GI: Soft.  No distension. There is no tenderness.  Skin: WDI    Neurological Examination Mental Status: Alert, oriented, thought content appropriate.  Has mild aphasia with some difficulty naming, repetition.  Answers questions with 1-2 words appropriately however starts having word salad while talking in  sentences.  Able to follow simple commands without difficulty. Cranial Nerves: II: Visual fields grossly normal,  III,IV, VI: ptosis not present, extra-ocular motions intact bilaterally, pupils equal, round, reactive to light and accommodation V,VII: smile symmetric, facial light touch sensation normal bilaterally VIII: hearing normal bilaterally IX,X: uvula rises symmetrically XI: bilateral shoulder shrug XII: midline tongue extension Motor: Right : Upper extremity   5/5    Left:     Upper extremity   5/5  Lower extremity   5/5     Lower extremity   5/5 Tone and bulk:normal tone throughout; no atrophy noted Sensory: Pinprick and light touch intact throughout, bilaterally Deep Tendon  Reflexes: 2+ and symmetric throughout Plantars: Right: downgoing   Left: downgoing Cerebellar: normal finger-to-nose, normal rapid alternating movements and normal heel-to-shin test Gait: Not assessed     Lab Results: Basic Metabolic Panel: Recent Labs  Lab 07/12/19 1712  NA 139  K 3.5  CL 103  CO2 26  GLUCOSE 146*  BUN 19  CREATININE 1.20  CALCIUM 8.8*    CBC: Recent Labs  Lab 07/12/19 1712  WBC 6.3  NEUTROABS 3.8  HGB 13.8  HCT 39.7  MCV 87.3  PLT 158    Coagulation Studies: Recent Labs    07/12/19 1712  LABPROT 14.4  INR 1.1    Imaging: Dg Chest 2 View  Result Date: 07/12/2019 CLINICAL DATA:  Altered mental status EXAM: CHEST - 2 VIEW COMPARISON:  None. FINDINGS: Heart is borderline in size. Lungs clear. No effusions. No acute bony abnormality. IMPRESSION: No active cardiopulmonary disease. Electronically Signed   By: Charlett Nose M.D.   On: 07/12/2019 19:52   Ct Head Wo Contrast  Result Date: 07/12/2019 CLINICAL DATA:  Patient with difficulty finding words. EXAM: CT HEAD WITHOUT CONTRAST TECHNIQUE: Contiguous axial images were obtained from the base of the skull through the vertex without intravenous contrast. COMPARISON:  MRI brain 01/17/2018 FINDINGS: Brain:  Ventricles and sulci are prominent compatible with atrophy. Extensive periventricular and subcortical white matter hypodensities compatible with chronic microvascular ischemic changes. No evidence for acute cortically based infarct, intracranial hemorrhage, mass lesion or mass-effect. Bilateral chronic basal ganglia lacunar infarcts. Vascular: Unremarkable Skull: Intact. Sinuses/Orbits: Polypoid mucosal thickening within the maxillary sinuses bilaterally. Mucosal thickening involving the ethmoid air cells. Mastoid air cells are unremarkable. Orbits are unremarkable. Other: None. IMPRESSION: No acute intracranial process. Atrophy and chronic microvascular ischemic changes. Electronically Signed   By: [name] M.D.   On: 07/12/2019 18:07     ASSESSMENT AND PLAN  84 y.o. male with past medical history significant for hypertension, adrenal gland dysfunction, prior history of TIA, former smoker presents with expressive aphasia.  Patient has been having symptoms for more than a day outside the window for any acute intervention.  MRI brain and MRA have been ordered. There has been some concern for transient symptoms in the past and the EEG obtained in May of last year was unremarkable.  My strong suspicion that this event is likely a stroke.   Acute Ischemic Stroke   Recommend # MRI of the brain without contrast #MRA Head and neck  #Transthoracic Echo  # Start patient on ASA 325mg  daily and Plavix 75mg  daily,  #Start or continue Atorvastatin 80 mg/other high intensity statin # BP goal: permissive HTN upto 220/120 mmHg ( 185/110 if patient has CHF, CKD) # HBAIC and Lipid profile # Telemetry monitoring # Frequent neuro checks # NPO until passes stroke swallow screen  Please page stroke NP  Or  PA  Or MD from 8am -4 pm  as this patient from this time will be  followed by the stroke.   You can look them up on [email or web address]  Password [facility]      [facility] Pager Number  [facility]

## 2019-07-13 NOTE — Progress Notes (Signed)
  Echocardiogram 2D Echocardiogram has been performed.  Nicholas Hancock 07/13/2019, 10:23 AM

## 2019-07-13 NOTE — ED Notes (Signed)
Breakfast tray ordered 

## 2019-07-13 NOTE — Progress Notes (Signed)
Rehab Admissions Coordinator Note:  Patient was screened by Cleatrice Burke for appropriateness for an Inpatient Acute Rehab Consult per PT recs.   At this time, we are recommending Inpatient Rehab consult. I will place order for consult.  Cleatrice Burke RN MSN 07/13/2019, 9:34 AM  I can be reached at [phone].

## 2019-07-13 NOTE — Evaluation (Signed)
Physical Therapy Evaluation Patient Details Name: Nicholas Hancock MRN: [id number] DOB: 05/17/1935 Today's Date: 07/13/2019   History of Present Illness  Nicholas Hancock  is a 84 y.o. male,  w hypertension, hyperaldosteronism, moderate aortic regurgitation, elevated psa, vitamin D deficiency, apparently presents with c/o difficulty with word finding per his wife. MRI revealed, acute lacunar infarct in L corona radiata, severl other small acute in bilat occipital lobes and R frontal lobe.  Clinical Impression  Pt admitted with above. Pt presenting with impaired balance, impaired speech, confusion, dis-oriented to situation, place, and time, decreased insight to deficits and safety. Pt with urinary incontinence during ambulation limiting distance. Pt asked several times prior to get up if he had to use restroom and he reported "No I want to walk". Pt requires multiple verbal cues to stay on commode while PT went to get supplies to clean up urine and patient and pt tried to get up several times and ultimately was found standing in bathroom in bare feet with walker. Pt with no recall to stay sitting and noted significant decreased insight to safety. Attempted to call wife several times however she didn't answer. Unsure of patients cognitive and functional status at baseline. Aware he was home with spouse PTA. At this time recommending CIR for above deficits. PT to cont to follow and assess d/c recommendations.    Follow Up Recommendations CIR    Equipment Recommendations  None recommended by PT    Recommendations for Other Services       Precautions / Restrictions Precautions Precautions: Fall Precaution Comments: pt reports falling "A LOT" Restrictions Weight Bearing Restrictions: No      Mobility  Bed Mobility Overal bed mobility: Needs Assistance Bed Mobility: Supine to Sit     Supine to sit: Min guard     General bed mobility comments: HOB flat, increased time, labored effort, max  verbal cues to to scoot to EOB so feet are on the floor  Transfers Overall transfer level: Needs assistance Equipment used: Rolling walker (2 wheeled) Transfers: Sit to/from Stand Sit to Stand: Min assist         General transfer comment: max directional verbal cues, verbal cues for safe hand placement and not to pull up on walker, minA to steady upon standing during transition of hands from bed to RW  Ambulation/Gait Ambulation/Gait assistance: Min assist Gait Distance (Feet): 30 Feet(limited by urinary incontinence) Assistive device: Rolling walker (2 wheeled) Gait Pattern/deviations: Step-through pattern;Decreased stride length;Trunk flexed Gait velocity: slow   General Gait Details: max verbal cues to stay in walker, upon exiting room pt with urinary incontinence and was then re-directed to the bathroom  Stairs            Wheelchair Mobility    Modified Rankin (Stroke Patients Only) Modified Rankin (Stroke Patients Only) Pre-Morbid Rankin Score: Moderate disability Modified Rankin: Moderately severe disability     Balance Overall balance assessment: Needs assistance Sitting-balance support: No upper extremity supported;Feet supported Sitting balance-Leahy Scale: Fair     Standing balance support: Bilateral upper extremity supported Standing balance-Leahy Scale: Poor Standing balance comment: requires RW for safe standing                             Pertinent Vitals/Pain Pain Assessment: No/denies pain    Home Living Family/patient expects to be discharged to:: Private residence Living Arrangements: Spouse/significant other Available Help at Discharge: Family;Available 24 hours/day(spouse but unsure about level assist  she can provide) Type of Home: House Home Access: Level entry     Home Layout: One level Home Equipment: Walker - 2 wheels;Cane - single point;Grab bars - toilet;Grab bars - tub/shower Additional Comments: unsure of patients  reliability    Prior Function Level of Independence: Independent with assistive device(s)         Comments: uses RW, reports wife drives and does grocery shopping. Unsure of accuracy of PLOF report     Hand Dominance   Dominant Hand: Right    Extremity/Trunk Assessment   Upper Extremity Assessment Upper Extremity Assessment: Overall WFL for tasks assessed    Lower Extremity Assessment Lower Extremity Assessment: Generalized weakness    Cervical / Trunk Assessment Cervical / Trunk Assessment: Kyphotic  Communication   Communication: Expressive difficulties(word finding difficulties)  Cognition Arousal/Alertness: Awake/alert Behavior During Therapy: Impulsive Overall Cognitive Status: Impaired/Different from baseline(unable to get a hold of spouse to ask PLOF) Area of Impairment: Orientation;Attention;Memory;Following commands;Safety/judgement;Awareness;Problem solving                 Orientation Level: Disoriented to;Place;Time;Situation(said hospital but unable to state [facility]) Current Attention Level: Focused Memory: Decreased short-term memory Following Commands: Follows one step commands with increased time Safety/Judgement: Decreased awareness of safety;Decreased awareness of deficits Awareness: Intellectual(pt with urinary incontence) Problem Solving: Slow processing;Decreased initiation;Difficulty sequencing;Requires verbal cues;Requires tactile cues General Comments: asked pt several times if he needed to use the bathroom, pt declined and then once he amb into hallway pt with urinary incontinence. pt very impulsive with decreased safety awareness and insight to deficits.       General Comments General comments (skin integrity, edema, etc.): pt with tangential speech    Exercises     Assessment/Plan    PT Assessment Patient needs continued PT services  PT Problem List Decreased strength;Decreased activity tolerance;Decreased balance;Decreased  mobility;Decreased coordination;Decreased cognition;Decreased knowledge of use of DME;Decreased safety awareness       PT Treatment Interventions Gait training;DME instruction;Stair training;Functional mobility training;Therapeutic activities;Therapeutic exercise;Balance training;Neuromuscular re-education;Cognitive remediation;Patient/family education    PT Goals (Current goals can be found in the Care Plan section)  Acute Rehab PT Goals PT Goal Formulation: Patient unable to participate in goal setting Time For Goal Achievement: 07/20/19 Potential to Achieve Goals: Good    Frequency Min 4X/week   Barriers to discharge Decreased caregiver support unsure how much physical assist spouse can provide    Co-evaluation               AM-PAC PT "6 Clicks" Mobility  Outcome Measure Help needed turning from your back to your side while in a flat bed without using bedrails?: A Little Help needed moving from lying on your back to sitting on the side of a flat bed without using bedrails?: A Little Help needed moving to and from a bed to a chair (including a wheelchair)?: A Little Help needed standing up from a chair using your arms (e.g., wheelchair or bedside chair)?: A Little Help needed to walk in hospital room?: A Lot Help needed climbing 3-5 steps with a railing? : A Lot 6 Click Score: 16    End of Session Equipment Utilized During Treatment: Gait belt Activity Tolerance: Patient tolerated treatment well Patient left: in chair;with call bell/phone within reach;with chair alarm set;with nursing/sitter in room Nurse Communication: Mobility status(urinary incontinence, decreased safety awareness) PT Visit Diagnosis: Unsteadiness on feet (R26.81);History of falling (Z91.81);Difficulty in walking, not elsewhere classified (R26.2)    Time: 3716-9678 PT Time Calculation (min) (ACUTE  ONLY): 35 min   Charges:   PT Evaluation $PT Eval Moderate Complexity: 1 Mod PT Treatments $Gait  Training: 8-22 mins        Lewis Shock, PT, DPT Acute Rehabilitation Services Pager #: [phone] Office #: [phone]   Iona Hansen 07/13/2019, 9:26 AM
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
## 2019-07-13 NOTE — Progress Notes (Signed)
STROKE TEAM PROGRESS NOTE   INTERVAL HISTORY Wife at bedside. Pt lying in bed. As per wife, pt speech has improved since admission. On exam, pt continues to have paraphasic errors and intermittent word finding difficulty, name 3/4 and able to repeat.   Vitals:   07/13/19 0638 07/13/19 0725 07/13/19 0745 07/13/19 1229  BP:  (!) 144/54 (!) 210/87 (!) 173/73  Pulse: 73 73 66 84  Resp: 18 17 16 16   Temp:   97.6 F (36.4 C) 98.1 F (36.7 C)  TempSrc:   Oral Oral  SpO2: 96% 97% 98% 94%  Weight:      Height:        CBC:  Recent Labs  Lab 07/12/19 1712 07/13/19 0500  WBC 6.3 7.0  NEUTROABS 3.8  --   HGB 13.8 14.8  HCT 39.7 42.3  MCV 87.3 88.1  PLT 158 161    Basic Metabolic Panel:  Recent Labs  Lab 07/12/19 1712 07/13/19 0500  NA 139 141  K 3.5 3.3*  CL 103 106  CO2 26 26  GLUCOSE 146* 115*  BUN 19 16  CREATININE 1.20 1.17  CALCIUM 8.8* 8.9   Lipid Panel:     Component Value Date/Time   CHOL 226 (H) 07/13/2019 0500   CHOL 184 12/26/2017 1053   TRIG 79 07/13/2019 0500   HDL 31 (L) 07/13/2019 0500   HDL 27 (L) 12/26/2017 1053   CHOLHDL 7.3 07/13/2019 0500   VLDL 16 07/13/2019 0500   LDLCALC 179 (H) 07/13/2019 0500   LDLCALC 129 (H) 12/26/2017 1053   HgbA1c:  Lab Results  Component Value Date   HGBA1C 5.9 (H) 07/13/2019   Urine Drug Screen:     Component Value Date/Time   LABOPIA NONE DETECTED 07/12/2019 1923   COCAINSCRNUR NONE DETECTED 07/12/2019 1923   LABBENZ NONE DETECTED 07/12/2019 1923   AMPHETMU NONE DETECTED 07/12/2019 1923   THCU NONE DETECTED 07/12/2019 1923   LABBARB NONE DETECTED 07/12/2019 1923    Alcohol Level     Component Value Date/Time   ETH <10 07/12/2019 1712    IMAGING Dg Chest 2 View  Result Date: 07/12/2019 CLINICAL DATA:  Altered mental status EXAM: CHEST - 2 VIEW COMPARISON:  None. FINDINGS: Heart is borderline in size. Lungs clear. No effusions. No acute bony abnormality. IMPRESSION: No active cardiopulmonary disease.  Electronically Signed   By: [name]  Dover M.D.   On: 07/12/2019 19:52   Ct Head Wo Contrast  Result Date: 07/12/2019 CLINICAL DATA:  Patient with difficulty finding words. EXAM: CT HEAD WITHOUT CONTRAST TECHNIQUE: Contiguous axial images were obtained from the base of the skull through the vertex without intravenous contrast. COMPARISON:  MRI brain 01/17/2018 FINDINGS: Brain: Ventricles and sulci are prominent compatible with atrophy. Extensive periventricular and subcortical white matter hypodensities compatible with chronic microvascular ischemic changes. No evidence for acute cortically based infarct, intracranial hemorrhage, mass lesion or mass-effect. Bilateral chronic basal ganglia lacunar infarcts. Vascular: Unremarkable Skull: Intact. Sinuses/Orbits: Polypoid mucosal thickening within the maxillary sinuses bilaterally. Mucosal thickening involving the ethmoid air cells. Mastoid air cells are unremarkable. Orbits are unremarkable. Other: None. IMPRESSION: No acute intracranial process. Atrophy and chronic microvascular ischemic changes. Electronically Signed   By: Annia Beltrew  Davis M.D.   On: 07/12/2019 18:07   Mr Angio Head Wo Contrast  Result Date: 07/13/2019 CLINICAL DATA:  84 year old male with abnormal speech since yesterday. Brain MRI positive for acute left corona radiata and several additional acute to subacute bilateral cerebral infarcts with numerous  chronic microhemorrhages and intracranial artery ectasia. EXAM: MRA HEAD WITHOUT CONTRAST TECHNIQUE: Angiographic images of the Circle of Willis were obtained using MRA technique without intravenous contrast. COMPARISON:  Brain MRI today reported separately. FINDINGS: Antegrade flow in the posterior circulation with dominant right vertebral artery. The left vertebral artery functionally terminates in PICA. The right PICA origin is also patent. Mild distal vertebral irregularity without stenosis. Mild to moderate irregularity and stenosis of the  basilar artery, most pronounced in the proximal basilar as seen on series 9, image 64 and series 1062, image 17. The basilar artery remains patent. Ectatic basilar tip with no discrete aneurysm. Fetal type left PCA origin. Right posterior communicating artery is diminutive or absent. Patent SCA origins. No significant right PCA stenosis. There is a severe left PCA P3 segment stenosis with preserved distal flow. Mild superimposed multifocal left P2 segment irregularity and stenosis. Antegrade flow in both ICA siphons. Tortuous distal cervical ICAs. Bilateral siphon irregularity with mild stenosis greater on the left. Normal ophthalmic and left posterior communicating artery origins. Dominant right and diminutive or absent left ACA A1 segments. Ectatic right ICA terminus. Normal MCA and right ACA origins. Anterior communicating artery and tortuous bilateral ACA branches are within normal limits. Right MCA M1 segment and bifurcation are tortuous without stenosis. There is mild right MCA M3 branch irregularity. Left MCA M1 segment is tortuous and irregular with mild stenosis. The left MCA trifurcation is patent without stenosis. Visible left MCA branches are within normal limits. IMPRESSION: 1. 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 Stenoses are most pronounced in the Basilar Artery (Moderate) and Left PCA P3 segment (Severe). Mild stenosis elsewhere. No large vessel or intracranial branch occlusion identified. 2. Dominant right vertebral artery and right ACA A1 segment. Electronically Signed   By: Odessa Fleming M.D.   On: 07/13/2019 02:06   Mr Brain Wo Contrast  Result Date: 07/13/2019 CLINICAL DATA:  84 year old male with abnormal speech since yesterday. EXAM: MRI HEAD WITHOUT CONTRAST TECHNIQUE: Multiplanar, multiecho pulse sequences of the brain and surrounding structures were obtained without intravenous contrast. COMPARISON:  Plain head CT 07/12/2019. FINDINGS: Brain: Linear 10  millimeter area of restricted diffusion tracking from the left corona radiata to the posterior lentiform (series 7, image 55). There is also a small area of restricted diffusion in the left occipital lobe periventricular white matter on series 5, image 67. There is also a small linear area of contralateral right occipital lobe restricted diffusion on series 5, image 65 (and series 7, image 44). Furthermore there are one or two punctate, subtle areas of abnormal white matter diffusion in the right frontal lobe (such as on series 5, image 77) which are not definitely restricted on ADC. Superimposed chronic lacunar infarcts in the bilateral cerebral white matter, bilateral deep gray nuclei, and occasionally in the cerebellum. Superimposed numerous scattered chronic microhemorrhages in the brain, mostly sparing the deep gray nuclei. No definite chronic cortical encephalomalacia. No midline shift, mass effect, evidence of mass lesion, ventriculomegaly, extra-axial collection or acute intracranial hemorrhage. Cervicomedullary junction and pituitary are within normal limits. Vascular: Major intracranial vascular flow voids are preserved. There is generalized intracranial artery ectasia. The right vertebral artery appears dominant. See also intracranial MRA reported separately. Skull and upper cervical spine: Negative for age visible cervical spine. Visualized bone marrow signal is within normal limits. Sinuses/Orbits: Negative orbits. Multiple maxillary sinus mucous retention cysts. Other: Mastoids are well pneumatized. Visible internal auditory structures appear normal. Scalp and face soft tissues appear negative. IMPRESSION:  1. Small acute lacunar infarct in the left corona radiata with no associated hemorrhage or mass effect. But several additional small acute to subacute infarcts in the bilateral occipital lobes and right frontal lobe. Favor synchronous small vessel disease (see #2, #3) over a recent embolic event. 2.  Underlying advanced chronic small vessel ischemic disease. 3. Superimposed numerous chronic micro-hemorrhages in the brain, Amyloid Angiopathy is possible. 4. Generalized intracranial artery ectasia. See also intracranial MRA reported separately. Electronically Signed   By: Odessa Fleming M.D.   On: 07/13/2019 01:59   Vas US Carotid  Result Date: 07/13/2019 Carotid Arterial Duplex Study Indications:       TIA. Risk Factors:      Hypertension. Comparison Study:  no prior Performing Technologist: [name] RVS  Examination Guidelines: A complete evaluation includes B-mode imaging, spectral Doppler, color Doppler, and power Doppler as needed of all accessible portions of each vessel. Bilateral testing is considered an integral part of a complete examination. Limited examinations for reoccurring indications may be performed as noted.  Right Carotid Findings: +----------+--------+--------+--------+------------------+--------+           PSV cm/sEDV cm/sStenosisPlaque DescriptionComments +----------+--------+--------+--------+------------------+--------+ CCA Prox  77      4               heterogenous               +----------+--------+--------+--------+------------------+--------+ CCA Distal80      8               heterogenous               +----------+--------+--------+--------+------------------+--------+ ICA Prox  87      10      1-39%   heterogenous               +----------+--------+--------+--------+------------------+--------+ ICA Distal152     12                                         +----------+--------+--------+--------+------------------+--------+ ECA       174                                                +----------+--------+--------+--------+------------------+--------+ +----------+--------+-------+--------+-------------------+           PSV cm/sEDV cmsDescribeArm Pressure (mmHG) +----------+--------+-------+--------+-------------------+ Subclavian150                                         +----------+--------+-------+--------+-------------------+ +---------+--------+--+--------+-+---------+ VertebralPSV cm/s53EDV cm/s4Antegrade +---------+--------+--+--------+-+---------+  Left Carotid Findings: +----------+--------+--------+--------+------------------+--------+           PSV cm/sEDV cm/sStenosisPlaque DescriptionComments +----------+--------+--------+--------+------------------+--------+ CCA Prox  106                                                +----------+--------+--------+--------+------------------+--------+ CCA Distal69                      heterogenous               +----------+--------+--------+--------+------------------+--------+ ICA Prox  108     18  1-39%   heterogenous               +----------+--------+--------+--------+------------------+--------+ ICA Distal62      8                                          +----------+--------+--------+--------+------------------+--------+ ECA       138                                                +----------+--------+--------+--------+------------------+--------+ +----------+--------+--------+--------+-------------------+           PSV cm/sEDV cm/sDescribeArm Pressure (mmHG) +----------+--------+--------+--------+-------------------+ Subclavian120                                         +----------+--------+--------+--------+-------------------+ +---------+--------+--+--------+---------+ VertebralPSV cm/s49EDV cm/sAntegrade +---------+--------+--+--------+---------+  Summary: Right Carotid: Velocities in the right ICA are consistent with a 1-39% stenosis. Left Carotid: Velocities in the left ICA are consistent with a 1-39% stenosis. Vertebrals: Bilateral vertebral arteries demonstrate antegrade flow. *See table(s) above for measurements and observations.     Preliminary     PHYSICAL EXAM  Temp:  [97.6 F (36.4 C)-98.7 F (37.1 C)]  98.7 F (37.1 C) (11/30 1721) Pulse Rate:  [66-87] 87 (11/30 1721) Resp:  [11-18] 18 (11/30 1721) BP: (144-210)/(54-87) 163/81 (11/30 1721) SpO2:  [94 %-98 %] 97 % (11/30 1721)  General - Well nourished, well developed, in no apparent distress.  Ophthalmologic - fundi not visualized due to noncooperation.  Cardiovascular - Regular rhythm and rate.  Mental Status -  Level of arousal and orientation to place, and person were intact, however, not orientated to name or time. Language exam showed hesitancy of speech, intermittent wording finding difficulty and paraphasic errors. Following simple commands, naming 3/4 and able to repeat.  Cranial Nerves II - XII - II - Visual field intact OU. III, IV, VI - Extraocular movements intact. V - Facial sensation intact bilaterally. VII - Facial movement intact bilaterally. VIII - Hearing & vestibular intact bilaterally. X - Palate elevates symmetrically. XI - Chin turning & shoulder shrug intact bilaterally. XII - Tongue protrusion intact.  Motor Strength - The patient's strength was symmetrical in all extremities and pronator drift was absent.  Bulk was normal and fasciculations were absent.   Motor Tone - Muscle tone was assessed at the neck and appendages and was normal.  Reflexes - The patient's reflexes were symmetrical in all extremities and he had no pathological reflexes.  Sensory - Light touch, temperature/pinprick were assessed and were symmetrical.    Coordination - The patient had normal movements in the hands with no ataxia or dysmetria.  Tremor was absent.  Gait and Station - deferred.   ASSESSMENT/PLAN Mr. Nicholas KillingsWade J Hancock is a 84 y.o. male with history of  hypertension, adrenal gland dysfunction, prior history of TIA, former smoker presenting with language difficulty.   Stroke:   Small acute L BG/CR and multiple small subacute  B occipital and R frontal lobe infarcts, etiology unclear, but could be secondary to  synchronized small vessel disease vs. Embolic source  CT head No acute abnormality. Small vessel disease. Atrophy.   MRI  Small L corona radiata lacune w/  severall additional small subacute infarcts B occipital and R frontal lobe. Small vessel disease. Numerous chronic micro hemorrhages, concerning for CAA.   MRA  Widespread intracranial atherosclerosis. Moderate BA and severe L P3 stenoses.   Carotid Doppler  B ICA 1-39% stenosis, VAs antegrade   2D Echo EF 35-40%. No source of embolus   Hold off embolic work up due to pt not candidate for [facility] with possible CAA  LDL 179  HgbA1c 5.9  SCDs for VTE prophylaxis  aspirin 81 mg daily prior to admission, now on aspirin 81 mg daily. Given MRI pattern concerning for CAA, pt is not anticoagulation or DAPT candidate. Continue ASA 81 on d/c  Therapy recommendations:  CIR  Disposition:  Pending  Follow up Dr. 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 Permissive hypertension (OK if < 180/105) but gradually normalize in 5-7 days . Long-term BP goal normotensive  Hyperlipidemia  Home meds:  Omega 3 and lipitor 40  LDL 179, goal < 70  lipitor increased to 80 in hospital  Continue statin at discharge  Other Stroke Risk Factors  Advanced age  Former Cigarette smoker, quit 34 yrs ago  ETOH use, alcohol level <10, advised to drink no more than 2 drink(s) a day  Hx stroke/TIA  Possible stroke 2019, followed by Dr. Loretta Plume    Family hx stroke (father)  Other Active Problems  1st degree AV block  Aortic regurgitation  Hospital day # 0  Neurology will sign off. Please call with questions. Pt will follow up with Dr. Loretta Plume at [facility] in about 4 weeks. Thanks for the consult.  Rosalin Hawking, MD PhD Stroke  Neurology 07/13/2019 11:36 PM   To contact Stroke Continuity provider, please refer to [email or web address] After hours, contact General Neurology

## 2019-07-13 NOTE — Progress Notes (Signed)
Echo Tech called RN into the room pt HR sustaining in the 130's. Looks like the pt is in and out between Afib and tachy. MD paged awaiting page back

## 2019-07-13 NOTE — Progress Notes (Signed)
[facility] PROGRESS NOTE  Nicholas Hancock:[id number] DOB: [birth year] DOA: 07/12/2019 PCP: Adrian Prince, MD  Assessment/Plan:  #1.  Acute stroke.  MRI reveals a small acute lacunar infarct in the left corona radiata with no associated hemorrhage or mass-effect.  Several additional small acute to subacute infarcts in the bilateral occipital lobes and right frontal lobe.  Underlying advanced chronic small vessel ischemic disease.  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  Stenosis are most pronounced in the basilar artery and left PCA P3 segment (severe).  No large vessel or intracranial branch occlusion identified.  Panel with cholesterol 226, HDL 31, LDL 179.  Hemoglobin A1c 5.9. Patient continues with some aphasia and slurred speech mild facial droop.  Valuated by physical therapy who recommends CIR -Follow echo -Occupational Therapy -Continue aspirin and statin -Monitor blood pressure allow for permissive hypertension -Defer further management to neurology  #2.  Hypertension.  Blood pressure 10/87.  Home medications include spironolactone that was prescribed for hypoaldosteronism.  He has also been on metoprolol in the past for PACs and blood pressure however this was discontinued.  Chart review indicates he was evaluated by cardiology last month who recommended consideration of angiotensin receptor blocker if blood pressure remained elevated. -Monitor -Continue spironolactone  #3.  First-degree AV block.  Chart review indicates history of same.  At one time was on metoprolol this was discontinued.  Chart review also indicates he had an event monitor in May 2019 that according to cardiology was reassuring.  #4.  Aortic regurgitation.  Chart review indicates normal left ventricular ejection fraction section.  Asymptomatic last month with cardiology visit.  Note indicates if symptoms occur may consider aortic valve replacement and  recommend watching for any reduction in his ejection fraction or dilation of his left ventricle. -Follow echo results    Code Status: full Family Communication: attempted to call no answer and no means to leave message Disposition Plan: d/c tomorrow   Consultants:  aroor  Procedures:  echo  Antibiotics:  HPI/Subjective: Sitting up in bed and eating breakfast. Denies pain/disocomfort  Objective: Vitals:   07/13/19 0725 07/13/19 0745  BP: (!) 144/54 (!) 210/87  Pulse: 73 66  Resp: 17 16  Temp:  97.6 F (36.4 C)  SpO2: 97% 98%   No intake or output data in the 24 hours ending 07/13/19 0941 Filed Weights   07/12/19 1647  Weight: 72.6 kg    Exam:   General:  Awake alert no acute distress  Cardiovascular: rrr +murmur no LE edema  Respiratory: normal effort BS clear bilaterally no wheeze  Abdomen: non-distended non-tender +BS no guarding   Musculoskeletal: joints without swelling/erythema   Data Reviewed: Basic Metabolic Panel: Recent Labs  Lab 07/12/19 1712 07/13/19 0500  NA 139 141  K 3.5 3.3*  CL 103 106  CO2 26 26  GLUCOSE 146* 115*  BUN 19 16  CREATININE 1.20 1.17  CALCIUM 8.8* 8.9   Liver Function Tests: Recent Labs  Lab 07/12/19 1712 07/13/19 0500  AST 17 15  ALT 13 13  ALKPHOS 68 67  BILITOT 1.2 1.3*  PROT 6.1* 6.4*  ALBUMIN 3.5 3.6   No results for input(s): LIPASE, AMYLASE in the last 168 hours. No results for input(s): AMMONIA in the last 168 hours. CBC: Recent Labs  Lab 07/12/19 1712 07/13/19 0500  WBC 6.3 7.0  NEUTROABS 3.8  --   HGB 13.8 14.8  HCT 39.7 42.3  MCV 87.3 88.1  PLT  158 161   Cardiac Enzymes: No results for input(s): CKTOTAL, CKMB, CKMBINDEX, TROPONINI in the last 168 hours. BNP (last 3 results) No results for input(s): BNP in the last 8760 hours.  ProBNP (last 3 results) No results for input(s): PROBNP in the last 8760 hours.  CBG: No results for input(s): GLUCAP in the last 168 hours.  Recent  Results (from the past 240 hour(s))  SARS CORONAVIRUS 2 (TAT 6-24 HRS) Nasopharyngeal Nasopharyngeal Swab     Status: None   Collection Time: 07/12/19  7:19 PM   Specimen: Nasopharyngeal Swab  Result Value Ref Range Status   SARS Coronavirus 2 NEGATIVE NEGATIVE Final    Comment: (NOTE) SARS-CoV-2 target nucleic acids are NOT DETECTED. The SARS-CoV-2 RNA is generally detectable in upper and lower respiratory specimens during the acute phase of infection. Negative results do not preclude SARS-CoV-2 infection, do not rule out co-infections with other pathogens, and should not be used as the sole basis for treatment or other patient management decisions. Negative results must be combined with clinical observations, patient history, and epidemiological information. The expected result is Negative. Fact Sheet for Patients: HairSlick.no Fact Sheet for Healthcare Providers: quierodirigir.com This test is not yet approved or cleared by the [place] FDA and  has been authorized for detection and/or diagnosis of SARS-CoV-2 by FDA under an Emergency Use Authorization (EUA). This EUA will remain  in effect (meaning this test can be used) for the duration of the COVID-19 declaration under Section 56 4(b)(1) of the Act, 21 U.S.C. section 360bbb-3(b)(1), unless the authorization is terminated or revoked sooner. Performed at [facility], [address] [place], [place] [address]      Studies: Dg Chest 2 View  Result Date: 07/12/2019 CLINICAL DATA:  Altered mental status EXAM: CHEST - 2 VIEW COMPARISON:  None. FINDINGS: Heart is borderline in size. Lungs clear. No effusions. No acute bony abnormality. IMPRESSION: No active cardiopulmonary disease. Electronically Signed   By: Charlett Nose M.D.   On: 07/12/2019 19:52   Ct Head Wo Contrast  Result Date: 07/12/2019 CLINICAL DATA:  Patient with difficulty finding words. EXAM: CT  HEAD WITHOUT CONTRAST TECHNIQUE: Contiguous axial images were obtained from the base of the skull through the vertex without intravenous contrast. COMPARISON:  MRI brain 01/17/2018 FINDINGS: Brain: Ventricles and sulci are prominent compatible with atrophy. Extensive periventricular and subcortical white matter hypodensities compatible with chronic microvascular ischemic changes. No evidence for acute cortically based infarct, intracranial hemorrhage, mass lesion or mass-effect. Bilateral chronic basal ganglia lacunar infarcts. Vascular: Unremarkable Skull: Intact. Sinuses/Orbits: Polypoid mucosal thickening within the maxillary sinuses bilaterally. Mucosal thickening involving the ethmoid air cells. Mastoid air cells are unremarkable. Orbits are unremarkable. Other: None. IMPRESSION: No acute intracranial process. Atrophy and chronic microvascular ischemic changes. Electronically Signed   By: [name] M.D.   On: 07/12/2019 18:07   Mr Angio Head Wo Contrast  Result Date: 07/13/2019 CLINICAL DATA:  84 year old male with abnormal speech since yesterday. Brain MRI positive for acute left corona radiata and several additional acute to subacute bilateral cerebral infarcts with numerous chronic microhemorrhages and intracranial artery ectasia. EXAM: MRA HEAD WITHOUT CONTRAST TECHNIQUE: Angiographic images of the Circle of Willis were obtained using MRA technique without intravenous contrast. COMPARISON:  Brain MRI today reported separately. FINDINGS: Antegrade flow in the posterior circulation with dominant right vertebral artery. The left vertebral artery functionally terminates in PICA. The right PICA origin is also patent. Mild distal vertebral irregularity without stenosis. Mild to  moderate irregularity and stenosis of the basilar artery, most pronounced in the proximal basilar as seen on series 9, image 64 and series 1062, image 17. The basilar artery remains patent. Ectatic basilar tip with no discrete  aneurysm. Fetal type left PCA origin. Right posterior communicating artery is diminutive or absent. Patent SCA origins. No significant right PCA stenosis. There is a severe left PCA P3 segment stenosis with preserved distal flow. Mild superimposed multifocal left P2 segment irregularity and stenosis. Antegrade flow in both ICA siphons. Tortuous distal cervical ICAs. Bilateral siphon irregularity with mild stenosis greater on the left. Normal ophthalmic and left posterior communicating artery origins. Dominant right and diminutive or absent left ACA A1 segments. Ectatic right ICA terminus. Normal MCA and right ACA origins. Anterior communicating artery and tortuous bilateral ACA branches are within normal limits. Right MCA M1 segment and bifurcation are tortuous without stenosis. There is mild right MCA M3 branch irregularity. Left MCA M1 segment is tortuous and irregular with mild stenosis. The left MCA trifurcation is patent without stenosis. Visible left MCA branches are within normal limits. IMPRESSION: 1. 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 Stenoses are most pronounced in the Basilar Artery (Moderate) and Left PCA P3 segment (Severe). Mild stenosis elsewhere. No large vessel or intracranial branch occlusion identified. 2. Dominant right vertebral artery and right ACA A1 segment. Electronically Signed   By: Genevie Ann M.D.   On: 07/13/2019 02:06   Mr Brain Wo Contrast  Result Date: 07/13/2019 CLINICAL DATA:  84 year old male with abnormal speech since yesterday. EXAM: MRI HEAD WITHOUT CONTRAST TECHNIQUE: Multiplanar, multiecho pulse sequences of the brain and surrounding structures were obtained without intravenous contrast. COMPARISON:  Plain head CT 07/12/2019. FINDINGS: Brain: Linear 10 millimeter area of restricted diffusion tracking from the left corona radiata to the posterior lentiform (series 7, image 55). There is also a small area of restricted diffusion in the  left occipital lobe periventricular white matter on series 5, image 67. There is also a small linear area of contralateral right occipital lobe restricted diffusion on series 5, image 65 (and series 7, image 44). Furthermore there are one or two punctate, subtle areas of abnormal white matter diffusion in the right frontal lobe (such as on series 5, image 77) which are not definitely restricted on ADC. Superimposed chronic lacunar infarcts in the bilateral cerebral white matter, bilateral deep gray nuclei, and occasionally in the cerebellum. Superimposed numerous scattered chronic microhemorrhages in the brain, mostly sparing the deep gray nuclei. No definite chronic cortical encephalomalacia. No midline shift, mass effect, evidence of mass lesion, ventriculomegaly, extra-axial collection or acute intracranial hemorrhage. Cervicomedullary junction and pituitary are within normal limits. Vascular: Major intracranial vascular flow voids are preserved. There is generalized intracranial artery ectasia. The right vertebral artery appears dominant. See also intracranial MRA reported separately. Skull and upper cervical spine: Negative for age visible cervical spine. Visualized bone marrow signal is within normal limits. Sinuses/Orbits: Negative orbits. Multiple maxillary sinus mucous retention cysts. Other: Mastoids are well pneumatized. Visible internal auditory structures appear normal. Scalp and face soft tissues appear negative. IMPRESSION: 1. Small acute lacunar infarct in the left corona radiata with no associated hemorrhage or mass effect. But several additional small acute to subacute infarcts in the bilateral occipital lobes and right frontal lobe. Favor synchronous small vessel disease (see #2, #3) over a recent embolic event. 2. Underlying advanced chronic small vessel ischemic disease. 3. Superimposed numerous chronic micro-hemorrhages in the brain, Amyloid Angiopathy is possible. 4. Generalized  intracranial  artery ectasia. See also intracranial MRA reported separately. Electronically Signed   By: Odessa FlemingH  Hall M.D.   On: 07/13/2019 01:59    Scheduled Meds: .  stroke: mapping our early stages of recovery book   Does not apply Once  . aspirin EC  81 mg Oral Daily  . atorvastatin  80 mg Oral Daily  . cholecalciferol  1,000 Units Oral QODAY  . spironolactone  50 mg Oral Q0600  . vitamin B-12  1,000 mcg Oral Daily   Continuous Infusions:  Principal Problem:   Acute CVA (cerebrovascular accident) [facility](HCC) Active Problems:   Aortic valve insufficiency   Hypertension   1st degree AV block   Vitamin D deficiency   Adrenal gland dysfunction (HCC)    Time spent: 45 minutes    [facility] VictoriaBLACK,Rianne Degraaf M NP  [facility]  If 7PM-7AM, please contact night-coverage at [email or web address], password [email or web address] 07/13/2019, 9:41 AM  LOS: 0 days

## 2019-07-13 NOTE — Evaluation (Signed)
Clinical/Bedside Swallow Evaluation Patient Details  Name: Nicholas Hancock MRN: [id number] Date of Birth: [birth year]  Today's Date: 07/13/2019 Time: SLP Start Time (ACUTE ONLY): 1429 SLP Stop Time (ACUTE ONLY): 1449 SLP Time Calculation (min) (ACUTE ONLY): 20 min  Past Medical History:  Past Medical History:  Diagnosis Date  . 1st degree AV block   . 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 Aortic valve insufficiency   . Arthritis    hands  . CVA (cerebral vascular accident) (HCC)   . Elevated PSA    monitored by Dr. Earlene Plater  . Hypertension    takes diuretic for adrenal gland dysfunction, BP will be high without diuretic  . Vitamin D deficiency    Past Surgical History:  Past Surgical History:  Procedure Laterality Date  . HAND SURGERY  09/21/1987   Enlarged mass on right palm overlying right long finger metacarpophalangeal joint removed  . INGUINAL HERNIA REPAIR  07/15/2012   Procedure: LAPAROSCOPIC BILATERAL INGUINAL HERNIA REPAIR;  Surgeon: Valarie Merino, MD;  Location: [facility] ORS;  Service: General;  Laterality: Bilateral;  Laparoscopic Bilateral Inguinal Hernia Repairs with Mesh  . left knee arthroscopy  3 years ago  . PELVIC FRACTURE SURGERY     chippped in high school  . TONSILLECTOMY AND ADENOIDECTOMY     HPI:   Nicholas Hancock with past medical history significant for hypertension, adrenal gland dysfunction, prior history of TIA, former smoker presents to the emergency department with difficulty with language that began yesterday evening and persisted today morning.  Patient denies any other symptoms such as weakness or numbness, slurred speech, vision changes or difficulty walking.  MRI 11/30: "Small acute lacunar infarct in the left corona radiata with noassociated hemorrhage or mass effect. But several additional small acute to subacute infarcts in thebilateral occipital lobes and right frontal lobe"  CXR  11/29: clear   Assessment / Plan / Recommendation Clinical Impression  Pt presents with a suspected mild oropharyngeal dysphagia. Generalized weakness noted of oral musculature. Pt exhibited prolonged mastication of solid PO and mild oral residuals bilaterally. Increased time for mastication and pts reflexive lingual sweep assisted in clearing buccal regions of mild residuals. Vocal quality remained clear throughout PO trials. Pt with delayed cough instance x1 following completion of PO trials. Per RN report some dysphagia was noticed by family at CVA presentation at home. Will follow up for PO meal observation to ensure diet tolerance.      SLP Visit Diagnosis: Dysphagia, unspecified (R13.10)    Aspiration Risk  Mild aspiration risk    Diet Recommendation   Regular, Thin liquids   Medication Administration: Whole meds with liquid    Other  Recommendations Oral Care Recommendations: Oral care BID   Follow up Recommendations Inpatient Rehab;Skilled Nursing facility      Frequency and Duration min 2x/week          Prognosis Prognosis for Safe Diet Advancement: Good Barriers to Reach Goals: Time post onset;Cognitive deficits      Swallow Study   General Date of Onset: 07/12/19 HPI:  Nicholas Hancock with past medical history significant for hypertension, adrenal gland dysfunction, prior history of TIA, former smoker presents to the emergency department with difficulty with language that began yesterday evening and persisted today morning.  Patient denies any other symptoms such as weakness or numbness, slurred speech, vision changes or difficulty walking.  MRI 11/30: "Small acute lacunar infarct in the left  corona radiata with noassociated hemorrhage or mass effect. But several additional small acute to subacute infarcts in thebilateral occipital lobes and right frontal lobe"  CXR 11/29: clear Type of Study: Bedside Swallow Evaluation Previous Swallow Assessment: none on file Diet Prior  to this Study: Regular;Thin liquids Temperature Spikes Noted: No Respiratory Status: Room air History of Recent Intubation: No Behavior/Cognition: Alert;Cooperative;Requires cueing(some aphasia persists) Oral Cavity Assessment: Within Functional Limits Oral Care Completed by SLP: No Oral Cavity - Dentition: Adequate natural dentition Vision: Functional for self-feeding Self-Feeding Abilities: Needs set up Patient Positioning: Upright in bed Baseline Vocal Quality: Low vocal intensity Volitional Swallow: Able to elicit    Oral/Motor/Sensory Function Overall Oral Motor/Sensory Function: Generalized oral weakness   Ice Chips Ice chips: Within functional limits   Thin Liquid Thin Liquid: Impaired Presentation: Cup;Straw Oral Phase Functional Implications: Prolonged oral transit Pharyngeal  Phase Impairments: Suspected delayed Swallow;Multiple swallows;Cough - Delayed    Nectar Thick Nectar Thick Liquid: Not tested   Honey Thick Honey Thick Liquid: Not tested   Puree Puree: Within functional limits Presentation: Spoon   Solid     Solid: Impaired Presentation: Self Fed Oral Phase Impairments: Impaired mastication Oral Phase Functional Implications: Prolonged oral transit;Impaired mastication;Oral residue Pharyngeal Phase Impairments: Suspected delayed Swallow;Multiple swallows      Marg Macmaster E Yuriy Cui MA, CCC-SLP Acute Rehabilitation Services  07/13/2019,2:57 PM

## 2019-07-13 NOTE — Consult Note (Signed)
Physical Medicine and Rehabilitation Consult Reason for Consult: Decreased functional mobility Referring Physician: [facility]   HPI: Nicholas Hancock is a 84 y.o. right-handed male with history of hypertension, adrenal gland dysfunction, prior history of TIA maintained on aspirin, remote tobacco abuse.  Per chart review patient lives with spouse.  Independent with assistive device prior to admission.  1 level home.  Presented 07/12/2019 with aphasia and leaning to the right.  Cranial CT scan negative.  Patient did not receive TPA.  MRI of the brain showed small acute lacunar infarct in the left corona radiata with no associated hemorrhage or mass-effect.  Several additional small acute to subacute infarcts in the bilateral occipital lobes and right frontal lobe.  MRA with no large vessel occlusion.  Echocardiogram is pending.  Admission chemistries unremarkable.  Urine drug screen negative.  SARS coronavirus negative.  Neurology follow-up maintained on aspirin for CVA prophylaxis.  Tolerating a regular diet.  Therapy evaluations completed recommendations of physical medicine rehab consult.   Review of Systems  Constitutional: Negative for chills and fever.  HENT: Negative for hearing loss.   Eyes: Negative for blurred vision and double vision.  Respiratory: Negative for shortness of breath.   Cardiovascular: Negative for chest pain, palpitations and leg swelling.  Gastrointestinal: Positive for constipation. Negative for heartburn, nausea and vomiting.  Genitourinary: Positive for urgency. Negative for dysuria, flank pain and hematuria.  Musculoskeletal: Positive for joint pain and myalgias.  Skin: Negative for rash.  Neurological: Positive for speech change and weakness.  All other systems reviewed and are negative.  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 Earlene Plateravis   Hypertension    takes diuretic for adrenal gland dysfunction, BP will be high without diuretic   Vitamin D deficiency    Past Surgical History:  Procedure Laterality Date   HAND SURGERY  09/21/1987   Enlarged mass on right palm overlying right long finger metacarpophalangeal joint removed   INGUINAL HERNIA REPAIR  07/15/2012   Procedure: LAPAROSCOPIC BILATERAL INGUINAL HERNIA REPAIR;  Surgeon: Valarie MerinoMatthew B Martin, MD;  Location: [facility];  Service: General;  Laterality: Bilateral;  Laparoscopic Bilateral Inguinal Hernia Repairs with Mesh   left knee arthroscopy  3 years ago   PELVIC FRACTURE SURGERY     chippped in high school   TONSILLECTOMY AND ADENOIDECTOMY     Family History  Problem Relation Age of Onset   Stroke Father    Diabetes Father        `   Cancer Father        unknown   Heart disease Mother    Cancer Mother        unknown   Social History:  reports that he quit smoking about 34 years ago. His smoking use included pipe. He quit after 20.00 years of use. He has never used smokeless tobacco. He reports current alcohol use. He reports that he does not use drugs. Allergies: No Known Allergies Medications Prior to Admission  Medication Sig Dispense Refill   aspirin EC 81 MG tablet Take 81 mg by mouth daily.     atorvastatin (LIPITOR) 80 MG tablet Take 1 tablet (80 mg total)  by mouth daily. 30 tablet 5   Calcium 167 MG CAPS Take 1 capsule by mouth daily.     Cholecalciferol (VITAMIN D-3) 5000 UNITS TABS Take 1 tablet by mouth every other day.     Flaxseed, Linseed, (FLAX SEEDS PO) Take 1 tablet by mouth daily.     Multiple Vitamin (MULTIVITAMIN WITH MINERALS) TABS Take 1 tablet by mouth daily.     Omega-3 Fatty Acids (SALMON OIL-1000 PO) Take 1,000 mg by mouth daily.     spironolactone (ALDACTONE) 50 MG tablet TAKE 1  TABLET BY MOUTH EVERY DAY BEFORE BREAKFAST 90 tablet 3   Ubiquinol 100 MG CAPS Take 1 capsule by mouth daily.     vitamin B-12 (CYANOCOBALAMIN) 1000 MCG tablet Take 1,000 mcg by mouth daily.     vitamin C (ASCORBIC ACID) 500 MG tablet Take 500 mg by mouth daily.      Home: Home Living Family/patient expects to be discharged to:: Private residence Living Arrangements: Spouse/significant other Available Help at Discharge: Family, Available 24 hours/day(spouse but unsure about level assist she can provide) Type of Home: House Home Access: Level entry Home Layout: One level Bathroom Shower/Tub: Engineer, manufacturing systems: Standard Home Equipment: Environmental consultant - 2 wheels, Cane - single point, Grab bars - toilet, Grab bars - tub/shower Additional Comments: unsure of patients reliability  Functional History: Prior Function Level of Independence: Independent with assistive device(s) Comments: uses RW, reports wife drives and does grocery shopping. Unsure of accuracy of PLOF report Functional Status:  Mobility: Bed Mobility Overal bed mobility: Needs Assistance Bed Mobility: Supine to Sit Supine to sit: Min guard General bed mobility comments: HOB flat, increased time, labored effort, max verbal cues to to scoot to EOB so feet are on the floor Transfers Overall transfer level: Needs assistance Equipment used: Rolling walker (2 wheeled) Transfers: Sit to/from Stand Sit to Stand: Min assist General transfer comment: max directional verbal cues, verbal cues for safe hand placement and not to pull up on walker, minA to steady upon standing during transition of hands from bed to RW Ambulation/Gait Ambulation/Gait assistance: Min assist Gait Distance (Feet): 30 Feet(limited by urinary incontinence) Assistive device: Rolling walker (2 wheeled) Gait Pattern/deviations: Step-through pattern, Decreased stride length, Trunk flexed General Gait Details: max verbal cues to stay in walker, upon  exiting room pt with urinary incontinence and was then re-directed to the bathroom Gait velocity: slow    ADL:  To be determined  Cognition: Cognition Overall Cognitive Status: Impaired/Different from baseline(unable to get a hold of spouse to ask PLOF) Orientation Level: Oriented to person, Oriented to place, Oriented to situation, Disoriented to time Cognition Arousal/Alertness: Awake/alert Behavior During Therapy: Impulsive Overall Cognitive Status: Impaired/Different from baseline(unable to get a hold of spouse to ask PLOF) Area of Impairment: Orientation, Attention, Memory, Following commands, Safety/judgement, Awareness, Problem solving Orientation Level: Disoriented to, Place, Time, Situation(said hospital but unable to state [facility]) Current Attention Level: Focused Memory: Decreased short-term memory Following Commands: Follows one step commands with increased time Safety/Judgement: Decreased awareness of safety, Decreased awareness of deficits Awareness: Intellectual(pt with urinary incontence) Problem Solving: Slow processing, Decreased initiation, Difficulty sequencing, Requires verbal cues, Requires tactile cues General Comments: asked pt several times if he needed to use the bathroom, pt declined and then once he amb into hallway pt with urinary incontinence. pt very impulsive with decreased safety awareness and insight to deficits.   Blood pressure (!) 210/87, pulse 66, temperature 97.6 F (36.4 C), temperature source Oral, resp. rate 16,  height 5\' 8"  (1.727 m), weight 72.6 kg, SpO2 98 %.  Physical Exam  Gen: no distress, normal appearing HEENT: oral mucosa pink and moist, NCAT Cardio: Reg rate Chest: normal effort, normal rate of breathing Abd: soft, non-distended Ext: no edema Skin: intact Psych: pleasant, normal affect Neurological:  Patient is alert.  Mild aphasia.  Follows simple commands.  Had some difficulty with naming and repetition.  Impaired finger to  nose bilaterally--able to perform with increased time. Slightly slower on left side.  CN 2-12 intact Sensation intact. Musculoskeletal: 5/5 strength throughout.  Results for orders placed or performed during the hospital encounter of 07/12/19 (from the past 24 hour(s))  Ethanol     Status: None   Collection Time: 07/12/19  5:12 PM  Result Value Ref Range   Alcohol, Ethyl (B) <10 <10 mg/dL  Protime-INR     Status: None   Collection Time: 07/12/19  5:12 PM  Result Value Ref Range   Prothrombin Time 14.4 11.4 - 15.2 seconds   INR 1.1 0.8 - 1.2  APTT     Status: None   Collection Time: 07/12/19  5:12 PM  Result Value Ref Range   aPTT 28 24 - 36 seconds  CBC     Status: None   Collection Time: 07/12/19  5:12 PM  Result Value Ref Range   WBC 6.3 4.0 - 10.5 K/uL   RBC 4.55 4.22 - 5.81 MIL/uL   Hemoglobin 13.8 13.0 - 17.0 g/dL   HCT 13.239.7 44.039.0 - 10.252.0 %   MCV 87.3 80.0 - 100.0 fL   MCH 30.3 26.0 - 34.0 pg   MCHC 34.8 30.0 - 36.0 g/dL   RDW 72.512.9 36.611.5 - 44.015.5 %   Platelets 158 150 - 400 K/uL   nRBC 0.0 0.0 - 0.2 %  Differential     Status: None   Collection Time: 07/12/19  5:12 PM  Result Value Ref Range   Neutrophils Relative % 61 %   Neutro Abs 3.8 1.7 - 7.7 K/uL   Lymphocytes Relative 26 %   Lymphs Abs 1.6 0.7 - 4.0 K/uL   Monocytes Relative 9 %   Monocytes Absolute 0.5 0.1 - 1.0 K/uL   Eosinophils Relative 4 %   Eosinophils Absolute 0.3 0.0 - 0.5 K/uL   Basophils Relative 0 %   Basophils Absolute 0.0 0.0 - 0.1 K/uL   Immature Granulocytes 0 %   Abs Immature Granulocytes 0.02 0.00 - 0.07 K/uL  Comprehensive metabolic panel     Status: Abnormal   Collection Time: 07/12/19  5:12 PM  Result Value Ref Range   Sodium 139 135 - 145 mmol/L   Potassium 3.5 3.5 - 5.1 mmol/L   Chloride 103 98 - 111 mmol/L   CO2 26 22 - 32 mmol/L   Glucose, Bld 146 (H) 70 - 99 mg/dL   BUN 19 8 - 23 mg/dL   Creatinine, Ser 3.471.20 0.61 - 1.24 mg/dL   Calcium 8.8 (L) 8.9 - 10.3 mg/dL   Total Protein  6.1 (L) 6.5 - 8.1 g/dL   Albumin 3.5 3.5 - 5.0 g/dL   AST 17 15 - 41 U/L   ALT 13 0 - 44 U/L   Alkaline Phosphatase 68 38 - 126 U/L   Total Bilirubin 1.2 0.3 - 1.2 mg/dL   GFR calc non Af Amer 56 (L) >60 mL/min   GFR calc Af Amer >60 >60 mL/min   Anion gap 10 5 - 15  SARS CORONAVIRUS 2 (TAT  6-24 HRS) Nasopharyngeal Nasopharyngeal Swab     Status: None   Collection Time: 07/12/19  7:19 PM   Specimen: Nasopharyngeal Swab  Result Value Ref Range   SARS Coronavirus 2 NEGATIVE NEGATIVE  Urine rapid drug screen (hosp performed)     Status: None   Collection Time: 07/12/19  7:23 PM  Result Value Ref Range   Opiates NONE DETECTED NONE DETECTED   Cocaine NONE DETECTED NONE DETECTED   Benzodiazepines NONE DETECTED NONE DETECTED   Amphetamines NONE DETECTED NONE DETECTED   Tetrahydrocannabinol NONE DETECTED NONE DETECTED   Barbiturates NONE DETECTED NONE DETECTED  Urinalysis, Routine w reflex microscopic     Status: Abnormal   Collection Time: 07/12/19  7:23 PM  Result Value Ref Range   Color, Urine STRAW (A) YELLOW   APPearance CLEAR CLEAR   Specific Gravity, Urine 1.004 (L) 1.005 - 1.030   pH 7.0 5.0 - 8.0   Glucose, UA NEGATIVE NEGATIVE mg/dL   Hgb urine dipstick NEGATIVE NEGATIVE   Bilirubin Urine NEGATIVE NEGATIVE   Ketones, ur NEGATIVE NEGATIVE mg/dL   Protein, ur NEGATIVE NEGATIVE mg/dL   Nitrite NEGATIVE NEGATIVE   Leukocytes,Ua NEGATIVE NEGATIVE  Hemoglobin A1c     Status: Abnormal   Collection Time: 07/13/19  5:00 AM  Result Value Ref Range   Hgb A1c MFr Bld 5.9 (H) 4.8 - 5.6 %   Mean Plasma Glucose 122.63 mg/dL  CBC     Status: None   Collection Time: 07/13/19  5:00 AM  Result Value Ref Range   WBC 7.0 4.0 - 10.5 K/uL   RBC 4.80 4.22 - 5.81 MIL/uL   Hemoglobin 14.8 13.0 - 17.0 g/dL   HCT 69.6 29.5 - 28.4 %   MCV 88.1 80.0 - 100.0 fL   MCH 30.8 26.0 - 34.0 pg   MCHC 35.0 30.0 - 36.0 g/dL   RDW 13.2 44.0 - 10.2 %   Platelets 161 150 - 400 K/uL   nRBC 0.0 0.0 - 0.2  %  Comprehensive metabolic panel     Status: Abnormal   Collection Time: 07/13/19  5:00 AM  Result Value Ref Range   Sodium 141 135 - 145 mmol/L   Potassium 3.3 (L) 3.5 - 5.1 mmol/L   Chloride 106 98 - 111 mmol/L   CO2 26 22 - 32 mmol/L   Glucose, Bld 115 (H) 70 - 99 mg/dL   BUN 16 8 - 23 mg/dL   Creatinine, Ser 7.25 0.61 - 1.24 mg/dL   Calcium 8.9 8.9 - 36.6 mg/dL   Total Protein 6.4 (L) 6.5 - 8.1 g/dL   Albumin 3.6 3.5 - 5.0 g/dL   AST 15 15 - 41 U/L   ALT 13 0 - 44 U/L   Alkaline Phosphatase 67 38 - 126 U/L   Total Bilirubin 1.3 (H) 0.3 - 1.2 mg/dL   GFR calc non Af Amer 58 (L) >60 mL/min   GFR calc Af Amer >60 >60 mL/min   Anion gap 9 5 - 15  Lipid panel     Status: Abnormal   Collection Time: 07/13/19  5:00 AM  Result Value Ref Range   Cholesterol 226 (H) 0 - 200 mg/dL   Triglycerides 79 <440 mg/dL   HDL 31 (L) >34 mg/dL   Total CHOL/HDL Ratio 7.3 RATIO   VLDL 16 0 - 40 mg/dL   LDL Cholesterol 742 (H) 0 - 99 mg/dL   Dg Chest 2 View  Result Date: 07/12/2019 CLINICAL DATA:  Altered  mental status EXAM: CHEST - 2 VIEW COMPARISON:  None. FINDINGS: Heart is borderline in size. Lungs clear. No effusions. No acute bony abnormality. IMPRESSION: No active cardiopulmonary disease. Electronically Signed   By: Rolm Baptise M.D.   On: 07/12/2019 19:52   Ct Head Wo Contrast  Result Date: 07/12/2019 CLINICAL DATA:  Patient with difficulty finding words. EXAM: CT HEAD WITHOUT CONTRAST TECHNIQUE: Contiguous axial images were obtained from the base of the skull through the vertex without intravenous contrast. COMPARISON:  MRI brain 01/17/2018 FINDINGS: Brain: Ventricles and sulci are prominent compatible with atrophy. Extensive periventricular and subcortical white matter hypodensities compatible with chronic microvascular ischemic changes. No evidence for acute cortically based infarct, intracranial hemorrhage, mass lesion or mass-effect. Bilateral chronic basal ganglia lacunar infarcts.  Vascular: Unremarkable Skull: Intact. Sinuses/Orbits: Polypoid mucosal thickening within the maxillary sinuses bilaterally. Mucosal thickening involving the ethmoid air cells. Mastoid air cells are unremarkable. Orbits are unremarkable. Other: None. IMPRESSION: No acute intracranial process. Atrophy and chronic microvascular ischemic changes. Electronically Signed   By: Lovey Newcomer M.D.   On: 07/12/2019 18:07   Mr Angio Head Wo Contrast  Result Date: 07/13/2019 CLINICAL DATA:  84 year old male with abnormal speech since yesterday. Brain MRI positive for acute left corona radiata and several additional acute to subacute bilateral cerebral infarcts with numerous chronic microhemorrhages and intracranial artery ectasia. EXAM: MRA HEAD WITHOUT CONTRAST TECHNIQUE: Angiographic images of the Circle of Willis were obtained using MRA technique without intravenous contrast. COMPARISON:  Brain MRI today reported separately. FINDINGS: Antegrade flow in the posterior circulation with dominant right vertebral artery. The left vertebral artery functionally terminates in PICA. The right PICA origin is also patent. Mild distal vertebral irregularity without stenosis. Mild to moderate irregularity and stenosis of the basilar artery, most pronounced in the proximal basilar as seen on series 9, image 64 and series 1062, image 17. The basilar artery remains patent. Ectatic basilar tip with no discrete aneurysm. Fetal type left PCA origin. Right posterior communicating artery is diminutive or absent. Patent SCA origins. No significant right PCA stenosis. There is a severe left PCA P3 segment stenosis with preserved distal flow. Mild superimposed multifocal left P2 segment irregularity and stenosis. Antegrade flow in both ICA siphons. Tortuous distal cervical ICAs. Bilateral siphon irregularity with mild stenosis greater on the left. Normal ophthalmic and left posterior communicating artery origins. Dominant right and diminutive or  absent left ACA A1 segments. Ectatic right ICA terminus. Normal MCA and right ACA origins. Anterior communicating artery and tortuous bilateral ACA branches are within normal limits. Right MCA M1 segment and bifurcation are tortuous without stenosis. There is mild right MCA M3 branch irregularity. Left MCA M1 segment is tortuous and irregular with mild stenosis. The left MCA trifurcation is patent without stenosis. Visible left MCA branches are within normal limits. IMPRESSION: 1. 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 Stenoses are most pronounced in the Basilar Artery (Moderate) and Left PCA P3 segment (Severe). Mild stenosis elsewhere. No large vessel or intracranial branch occlusion identified. 2. Dominant right vertebral artery and right ACA A1 segment. Electronically Signed   By: Genevie Ann M.D.   On: 07/13/2019 02:06   Mr Brain Wo Contrast  Result Date: 07/13/2019 CLINICAL DATA:  84 year old male with abnormal speech since yesterday. EXAM: MRI HEAD WITHOUT CONTRAST TECHNIQUE: Multiplanar, multiecho pulse sequences of the brain and surrounding structures were obtained without intravenous contrast. COMPARISON:  Plain head CT 07/12/2019. FINDINGS: Brain: Linear 10 millimeter area of restricted diffusion tracking from  the left corona radiata to the posterior lentiform (series 7, image 55). There is also a small area of restricted diffusion in the left occipital lobe periventricular white matter on series 5, image 67. There is also a small linear area of contralateral right occipital lobe restricted diffusion on series 5, image 65 (and series 7, image 44). Furthermore there are one or two punctate, subtle areas of abnormal white matter diffusion in the right frontal lobe (such as on series 5, image 77) which are not definitely restricted on ADC. Superimposed chronic lacunar infarcts in the bilateral cerebral white matter, bilateral deep gray nuclei, and occasionally in the  cerebellum. Superimposed numerous scattered chronic microhemorrhages in the brain, mostly sparing the deep gray nuclei. No definite chronic cortical encephalomalacia. No midline shift, mass effect, evidence of mass lesion, ventriculomegaly, extra-axial collection or acute intracranial hemorrhage. Cervicomedullary junction and pituitary are within normal limits. Vascular: Major intracranial vascular flow voids are preserved. There is generalized intracranial artery ectasia. The right vertebral artery appears dominant. See also intracranial MRA reported separately. Skull and upper cervical spine: Negative for age visible cervical spine. Visualized bone marrow signal is within normal limits. Sinuses/Orbits: Negative orbits. Multiple maxillary sinus mucous retention cysts. Other: Mastoids are well pneumatized. Visible internal auditory structures appear normal. Scalp and face soft tissues appear negative. IMPRESSION: 1. Small acute lacunar infarct in the left corona radiata with no associated hemorrhage or mass effect. But several additional small acute to subacute infarcts in the bilateral occipital lobes and right frontal lobe. Favor synchronous small vessel disease (see #2, #3) over a recent embolic event. 2. Underlying advanced chronic small vessel ischemic disease. 3. Superimposed numerous chronic micro-hemorrhages in the brain, Amyloid Angiopathy is possible. 4. Generalized intracranial artery ectasia. See also intracranial MRA reported separately. Electronically Signed   By: Odessa Fleming M.D.   On: 07/13/2019 01:59   Vas US Carotid  Result Date: 07/13/2019 Carotid Arterial Duplex Study Indications:       TIA. Risk Factors:      Hypertension. Comparison Study:  no prior Performing Technologist: [name] RVS  Examination Guidelines: A complete evaluation includes B-mode imaging, spectral Doppler, color Doppler, and power Doppler as needed of all accessible portions of each vessel. Bilateral testing is  considered an integral part of a complete examination. Limited examinations for reoccurring indications may be performed as noted.  Right Carotid Findings: +----------+--------+--------+--------+------------------+--------+             PSV cm/s EDV cm/s Stenosis Plaque Description Comments  +----------+--------+--------+--------+------------------+--------+  CCA Prox   77       4                 heterogenous                 +----------+--------+--------+--------+------------------+--------+  CCA Distal 80       8                 heterogenous                 +----------+--------+--------+--------+------------------+--------+  ICA Prox   87       10       1-39%    heterogenous                 +----------+--------+--------+--------+------------------+--------+  ICA Distal 152      12                                             +----------+--------+--------+--------+------------------+--------+  ECA        174                                                     +----------+--------+--------+--------+------------------+--------+ +----------+--------+-------+--------+-------------------+             PSV cm/s EDV cms Describe Arm Pressure (mmHG)  +----------+--------+-------+--------+-------------------+  Subclavian 150                                            +----------+--------+-------+--------+-------------------+ +---------+--------+--+--------+-+---------+  Vertebral PSV cm/s 53 EDV cm/s 4 Antegrade  +---------+--------+--+--------+-+---------+  Left Carotid Findings: +----------+--------+--------+--------+------------------+--------+             PSV cm/s EDV cm/s Stenosis Plaque Description Comments  +----------+--------+--------+--------+------------------+--------+  CCA Prox   106                                                     +----------+--------+--------+--------+------------------+--------+  CCA Distal 69                         heterogenous                  +----------+--------+--------+--------+------------------+--------+  ICA Prox   108      18       1-39%    heterogenous                 +----------+--------+--------+--------+------------------+--------+  ICA Distal 62       8                                              +----------+--------+--------+--------+------------------+--------+  ECA        138                                                     +----------+--------+--------+--------+------------------+--------+ +----------+--------+--------+--------+-------------------+             PSV cm/s EDV cm/s Describe Arm Pressure (mmHG)  +----------+--------+--------+--------+-------------------+  Subclavian 120                                             +----------+--------+--------+--------+-------------------+ +---------+--------+--+--------+---------+  Vertebral PSV cm/s 49 EDV cm/s Antegrade  +---------+--------+--+--------+---------+  Summary: Right Carotid: Velocities in the right ICA are consistent with a 1-39% stenosis. Left Carotid: Velocities in the left ICA are consistent with a 1-39% stenosis. Vertebrals: Bilateral vertebral arteries demonstrate antegrade flow. *See table(s) above for measurements and observations.     Preliminary      Assessment/Plan: Diagnosis: Impaired gait and mobility secondary to acute left corona radiata lacunar infarct 1. Does the need for close, 24 hr/day medical supervision in concert with the patient's  rehab needs make it unreasonable for this patient to be served in a less intensive setting? Yes 2. Co-Morbidities requiring supervision/potential complications: HTN, first degree AV block, aortic regurgitation 3. Due to bladder management, bowel management, safety, skin/wound care, disease management, medication administration and patient education, does the patient require 24 hr/day rehab nursing? Yes 4. Does the patient require coordinated care of a physician, rehab nurse, therapy disciplines of PT, OT, SLP to  address physical and functional deficits in the context of the above medical diagnosis(es)? Yes Addressing deficits in the following areas: balance, endurance, locomotion, strength, transferring, bowel/bladder control, bathing, dressing, feeding, grooming, toileting, cognition, speech, language and psychosocial support 5. Can the patient actively participate in an intensive therapy program of at least 3 hrs of therapy per day at least 5 days per week? Yes 6. The potential for patient to make measurable gains while on inpatient rehab is excellent 7. Anticipated functional outcomes upon discharge from inpatient rehab are modified independent  with PT, modified independent with OT, modified independent with SLP. 8. Estimated rehab length of stay to reach the above functional goals is: 10-14 days 9. Anticipated discharge destination: Home 10. Overall Rehab/Functional Prognosis: excellent  RECOMMENDATIONS: This patient's condition is appropriate for continued rehabilitative care in the following setting: CIR Patient has agreed to participate in recommended program. Yes Note that insurance prior authorization may be required for reimbursement for recommended care.  Comment: Nicholas Hancock would be an excellent candidate for rehabilitation once medically stable and his HR is better controlled. He will be able to tolerate 3 hours of daily therapy. He is high functioning at home and walks daily with his wife and our goal will be to return him as much as possible to his baseline function and to maximize his safety and independence. His wife reports frequent falls and he would benefit from increased focus on balance and caregiver training regarding fall prevention. Thank you for this consult. We will continue to follow in Nicholas Hancock's care.  I have personally performed a face to face diagnostic evaluation, including, but not limited to relevant history and physical exam findings, of this patient and developed  relevant assessment and plan.  Additionally, I have reviewed and concur with the physician assistant's documentation above.  [name], MD 07/13/2019

## 2019-07-13 NOTE — Progress Notes (Signed)
OT Cancellation Note  Patient Details Name: Nicholas Hancock MRN: [id number] DOB: Aug 14, 1934   Cancelled Treatment:    Reason Eval/Treat Not Completed: Medical issues which prohibited therapy. At rest, HR sustaining in the 130's. Will check back when appropriate, RN aware  Britt Bottom 07/13/2019, 11:02 AM

## 2019-07-14 DIAGNOSIS — I10 Essential (primary) hypertension: Secondary | ICD-10-CM

## 2019-07-14 DIAGNOSIS — I471 Supraventricular tachycardia: Secondary | ICD-10-CM

## 2019-07-14 DIAGNOSIS — I429 Cardiomyopathy, unspecified: Secondary | ICD-10-CM

## 2019-07-14 LAB — CBC WITH DIFFERENTIAL/PLATELET
Abs Immature Granulocytes: 0.03 10*3/uL (ref 0.00–0.07)
Basophils Absolute: 0 10*3/uL (ref 0.0–0.1)
Basophils Relative: 0 %
Eosinophils Absolute: 0 10*3/uL (ref 0.0–0.5)
Eosinophils Relative: 0 %
HCT: 43.6 % (ref 39.0–52.0)
Hemoglobin: 15.3 g/dL (ref 13.0–17.0)
Immature Granulocytes: 0 %
Lymphocytes Relative: 11 %
Lymphs Abs: 1.1 10*3/uL (ref 0.7–4.0)
MCH: 30.5 pg (ref 26.0–34.0)
MCHC: 35.1 g/dL (ref 30.0–36.0)
MCV: 87 fL (ref 80.0–100.0)
Monocytes Absolute: 0.8 10*3/uL (ref 0.1–1.0)
Monocytes Relative: 9 %
Neutro Abs: 7.5 10*3/uL (ref 1.7–7.7)
Neutrophils Relative %: 80 %
Platelets: 195 10*3/uL (ref 150–400)
RBC: 5.01 MIL/uL (ref 4.22–5.81)
RDW: 12.9 % (ref 11.5–15.5)
WBC: 9.5 10*3/uL (ref 4.0–10.5)
nRBC: 0 % (ref 0.0–0.2)

## 2019-07-14 LAB — BASIC METABOLIC PANEL
Anion gap: 10 (ref 5–15)
BUN: 22 mg/dL (ref 8–23)
CO2: 23 mmol/L (ref 22–32)
Calcium: 8.8 mg/dL — ABNORMAL LOW (ref 8.9–10.3)
Chloride: 106 mmol/L (ref 98–111)
Creatinine, Ser: 1.04 mg/dL (ref 0.61–1.24)
GFR calc Af Amer: >60 mL/min (ref >60)
GFR calc non Af Amer: >60 mL/min (ref >60)
Glucose, Bld: 151 mg/dL — ABNORMAL HIGH (ref 70–99)
Potassium: 3.1 mmol/L — ABNORMAL LOW (ref 3.5–5.1)
Sodium: 139 mmol/L (ref 135–145)

## 2019-07-14 MED ORDER — METOPROLOL SUCCINATE ER 50 MG PO TB24
50.0000 mg | ORAL_TABLET | Freq: Every day | ORAL | Status: DC
  Administered 2019-07-15 – 2019-07-17 (×3): 50 mg via ORAL
  Filled 2019-07-14 (×3): qty 1

## 2019-07-14 MED ORDER — LORAZEPAM 2 MG/ML IJ SOLN
1.0000 mg | INTRAMUSCULAR | Status: DC | PRN
  Administered 2019-07-14 – 2019-07-17 (×6): 1 mg via INTRAVENOUS
  Filled 2019-07-14 (×7): qty 1

## 2019-07-14 MED ORDER — HYDRALAZINE HCL 20 MG/ML IJ SOLN: 5.0000 mg | Freq: Four times a day (QID) | INTRAMUSCULAR | Status: DC | PRN

## 2019-07-14 MED ORDER — HYDRALAZINE HCL 20 MG/ML IJ SOLN
5.0000 mg | Freq: Four times a day (QID) | INTRAMUSCULAR | Status: DC | PRN
  Administered 2019-07-14 (×3): 5 mg via INTRAVENOUS
  Filled 2019-07-14 (×3): qty 1

## 2019-07-14 NOTE — PMR Pre-admission (Signed)
PMR Admission Coordinator Pre-Admission Assessment  Patient: Nicholas Hancock is an 84 y.o., male MRN: [id number] DOB: [birth year] Height: _0  (172.7 cm) Weight: 72.6 kg              Insurance Information HMO:     PPO: yes     PCP:      IPA:      80/20:      OTHER:  PRIMARY: U[employer] Medicare      Policy#: [id number]      Subscriber: Patient CM Name: Nicholas Hancock   Phone#: [phone]     Fax#: [phone] Pre-Cert#: [id number]     Employer:  Josem Kaufmann [id number] Everlene Balls number: [id number]) provided by Nicholas Hancock for admit to CIR. Pt is approved for 7 days (orignially 12/1-12/7).  Per CM Juliann Pulse, as pt plans to admit 12/4, next review date will now be 12/10. Clinical updates can be sent (f): [phone] (p): [phone]  Benefits:  Phone #: online     Name: [email or web address] Eff. Date: 08/13/2018 - 08/13/2019     Deduct: $290 ($290 met)      Out of Pocket Max: $3,290 (includes deductible - $381.02 met)      Life Max:  CIR: 80% coverage, 20% co-insurance; max co-insurance per stay 1-6 days is capped at $2,461.00, max co-insurance per stay 7-10 days is capped at $2,721.00, stay 11+ day co-insurance capped at members out of pocket max      SNF: 80% coverage, 20% co-insurance for days 1-100, max co-insurance for days 1-20 is $20/day, max co-insurance for days 21-100 is $178/day; limited to 100 days/cal yr Outpatient: 80% coverage, 20% co-insurance; limited by medical necessity     Home Health: 100% coverage, 0% co-insurance, $0/visit co-pay; limited by medical necessity (no more than 8 hrs/d and 35 hrs/wk)     DME: 80% coverage, 20% co-insurance Providers:  SECONDARY: None       Policy#:       Subscriber:  CM Name:       Phone#:      Fax#:  Pre-Cert#:       Employer:  Benefits:  Phone #:      Name:  Eff. Date:      Deduct:       Out of Pocket Max:       Life Max:  CIR:       SNF:  Outpatient:      Co-Pay:  Home Health:       Co-Pay:  DME:     Co-Pay:   Medicaid Application Date:       Case Manager:  Disability  Application Date:       Case Worker:   The "Data Collection Information Summary" for patients in Inpatient Rehabilitation Facilities with attached "Privacy Act [place] Records" was provided and verbally reviewed with: Patient and Family  Emergency Contact Information Contact Information    Name Relation Home Work Mobile   Rayos,[name] Spouse [phone]  [phone]     Current Medical History  Patient Admitting Diagnosis: acute left lacunar infarct  History of Present Illness: Nicholas Hancock is an 84 year old male with history of hypertension, adrenal gland dysfunction, prior history of TIA/CVA maintained on aspirin followed by neurology services Dr. Loretta Plume, remote tobacco abuse. Presented 07/12/2019 with aphasia and leaning to the right.  Cranial CT scan negative.  Patient did not receive TPA.  MRI of the brain showed small acute lacunar infarction in the left corona radiata with no associated hemorrhage or mass-effect.  Several small  additional acute to subacute infarcts of the bilateral occipital lobes and right frontal lobe.  MRA with no large vessel occlusion.  Echocardiogram with ejection fraction of 40% without emboli.  Admission chemistries unremarkable.  Urine drug screen negative.  SARS coronavirus negative.  Neurology follow-up maintained on aspirin for CVA prophylaxis.  Neurology did not feel patient was a candidate for DAPT given concern for possible CAA.  Tolerating regular diet.  Cardiology service did follow-up for review of low ejection fraction identified on echocardiogram.  Telemetry reviewed showed short burst of atrial tachycardia not consistent with atrial fibrillation or flutter.  Recommendations to start low-dose Toprol/Aldactone as well as continue low-dose aspirin.   Therapy evaluations completed patient is to be admitted for a comprehensive rehab program on 07/17/2019.   Complete NIHSS TOTAL: 3 Glasgow Coma Scale Score: 13  Past Medical History  Past  Medical History:  Diagnosis Date  . 1st degree AV block   . 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 Aortic valve insufficiency   . Arthritis    hands  . CVA (cerebral vascular accident) ([place])   . Elevated PSA    monitored by Dr. Rosana Hoes  . Hypertension    takes diuretic for adrenal gland dysfunction, BP will be high without diuretic  . Vitamin D deficiency     Family History  family history includes Cancer in his father and mother; Diabetes in his father; Heart disease in his mother; Stroke in his father.  Prior Rehab/Hospitalizations:  Has the patient had prior rehab or hospitalizations prior to admission? No  Has the patient had major surgery during 100 days prior to admission? No  Current Medications   Current Facility-Administered Medications:  .   stroke: mapping our early stages of recovery book, , Does not apply, Once, Jani Gravel, MD .  acetaminophen (TYLENOL) tablet 650 mg, 650 mg, Oral, Q4H PRN **OR** acetaminophen (TYLENOL) 160 MG/5ML solution 650 mg, 650 mg, Per Tube, Q4H PRN **OR** acetaminophen (TYLENOL) suppository 650 mg, 650 mg, Rectal, Q4H PRN, Jani Gravel, MD .  aspirin EC tablet 81 mg, 81 mg, Oral, Daily, Rosalin Hawking, MD, 81 mg at 07/17/19 3790 .  atorvastatin (LIPITOR) tablet 80 mg, 80 mg, Oral, Daily, Jani Gravel, MD, 80 mg at 07/17/19 2409 .  cholecalciferol (VITAMIN D3) tablet 1,000 Units, 1,000 Units, Oral, Oretha Milch, MD, 1,000 Units at 07/16/19 [phone] .  hydrALAZINE (APRESOLINE) injection 5 mg, 5 mg, Intravenous, Q6H PRN, Omar Person, NP, 5 mg at 07/14/19 2057 .  LORazepam (ATIVAN) injection 1 mg, 1 mg, Intravenous, Q4H PRN, [place], Audie Pinto, MD, 1 mg at 07/17/19 0026 .  losartan (COZAAR) tablet 25 mg, 25 mg, Oral, Daily, [place], [place], [place], 25 mg at 07/17/19 2992 .  metoprolol succinate (TOPROL-XL) 24 hr tablet 50 mg, 50 mg, Oral, Daily, Buford Dresser, MD, 50 mg at  07/17/19 4268 .  spironolactone (ALDACTONE) tablet 25 mg, 25 mg, Oral, Q0600, Almyra Deforest, PA, 25 mg at 07/17/19 0537 .  vitamin B-12 (CYANOCOBALAMIN) tablet 1,000 mcg, 1,000 mcg, Oral, Daily, Jani Gravel, MD, 1,000 mcg at 07/17/19 3419  Patients Current Diet:  Diet Order            Diet - low sodium heart healthy        Diet Heart Room service appropriate? Yes; Fluid consistency: Thin  Diet effective now              Precautions /  Restrictions Precautions Precautions: Fall Precaution Comments: pt reports falling "A LOT" Restrictions Weight Bearing Restrictions: No   Has the patient had 2 or more falls or a fall with injury in the past year?Yes  Prior Activity Level Community (5-7x/wk): active with wife at [facility]; retired from work but would go with wife for errands. would walk with wife daily, no longer drives.   Prior Functional Level Prior Function Level of Independence: Independent with assistive device(s) Comments: uses RW, reports wife drives and does grocery shopping. Unsure of accuracy of PLOF report  Self Care: Did the patient need help bathing, dressing, using the toilet or eating?  Independent  Indoor Mobility: Did the patient need assistance with walking from room to room (with or without device)? Independent  Stairs: Did the patient need assistance with internal or external stairs (with or without device)? Needed some help  Functional Cognition: Did the patient need help planning regular tasks such as shopping or remembering to take medications? Needed some help  Home Assistive Devices / Equipment Home Equipment: Walker - 2 wheels, Gates - single point, Grab bars - toilet, Grab bars - tub/shower  Prior Device Use: Indicate devices/aids used by the patient prior to current illness, exacerbation or injury? single point cane for community mobility; RW for nighttime bathroom trips  Current Functional Level Cognition  Overall Cognitive Status: Impaired/Different from  baseline Current Attention Level: Focused Orientation Level: Oriented to person, Disoriented to place, Disoriented to time, Disoriented to situation Following Commands: Follows one step commands inconsistently, Follows one step commands with increased time Safety/Judgement: Decreased awareness of safety, Decreased awareness of deficits General Comments: multimodal cues, max repitition for following commands Attention: Focused, Sustained Focused Attention: Appears intact Sustained Attention: Appears intact Memory: Impaired Memory Impairment: Decreased short term memory Awareness: Impaired Problem Solving: Impaired Problem Solving Impairment: Verbal complex Executive Function: Reasoning Reasoning: Impaired    Extremity Assessment (includes Sensation/Coordination)  Upper Extremity Assessment: Overall WFL for tasks assessed  Lower Extremity Assessment: Defer to PT evaluation    ADLs  Overall ADL's : Needs assistance/impaired Eating/Feeding: Sitting, Minimal assistance Grooming: Wash/dry hands, Wash/dry face, Minimal assistance, Cueing for sequencing, Cueing for compensatory techniques, Standing Grooming Details (indicate cue type and reason): Pt stood at sink to groom. Pt required min assist to maintain standing and VCs to remember to turn water off and to find the towels to his Left.  Pt impulsive, decreased attrntion span Upper Body Bathing: Cueing for compensatory techniques, Sitting, Minimal assistance Upper Body Bathing Details (indicate cue type and reason): simulated, mod A for cues to stay on task for completion Lower Body Bathing: Moderate assistance, Sit to/from stand, Cueing for compensatory techniques, Cueing for sequencing Lower Body Bathing Details (indicate cue type and reason): cues to continue with the task. Upper Body Dressing : Min guard, Sitting Upper Body Dressing Details (indicate cue type and reason): min assist to donn gown around the back side of himself.  This is  unfamiliar so may be difficult due to this.  Lower Body Dressing: Sit to/from stand, Cueing for compensatory techniques, Cueing for sequencing, Moderate assistance Lower Body Dressing Details (indicate cue type and reason): Difficulty donning R sock seated EOB while heavily leaning forward Toilet Transfer: Moderate assistance, Minimal assistance, Ambulation, RW, Cueing for safety, Cueing for sequencing, Comfort height toilet, Grab bars Toilet Transfer Details (indicate cue type and reason): Pt walked to bathroom to toilet with mod assist.  Pt not safe with walker use pushing walker far ahead of himself.  Pt  did run into furniture on the R and on the L when using the walker and was unable to correct without therapist realigning walker. Toileting- Clothing Manipulation and Hygiene: Moderate assistance, Sit to/from stand, Cueing for compensatory techniques Toileting - Clothing Manipulation Details (indicate cue type and reason): mod assist for balance while therpist cleaned pt. Functional mobility during ADLs: Moderate assistance, Minimal assistance, Rolling walker General ADL Comments: Poor cognition and balance. Difficulty donning R sock seated EOB while heavily leaning forward    Mobility  Overal bed mobility: Needs Assistance Bed Mobility: Supine to Sit, Sit to Supine Supine to sit: Min assist Sit to supine: Min assist General bed mobility comments: min assist for bed mobility due to decreased awareness    Transfers  Overall transfer level: Needs assistance Equipment used: Rolling walker (2 wheeled) Transfers: Sit to/from Stand Sit to Stand: Mod assist, From elevated surface Stand pivot transfers: Min assist General transfer comment: Patient unsteady with standing leaning to right with feet together off center to left in walker. Cues needed for patient to separate feet to improve balance.    Ambulation / Gait / Stairs / Wheelchair Mobility  Ambulation/Gait Ambulation/Gait assistance: Mod  assist, Max assist Gait Distance (Feet): 30 Feet Assistive device: Rolling walker (2 wheeled) Gait Pattern/deviations: Step-to pattern, Shuffle, Decreased stride length, Narrow base of support, Decreased step length - right General Gait Details: Patient is very unsteady with ambulation, one LOB when he let go of walker reaching for door of room. Requires mod assist for balance and max assist for steering of rolling walker. Flexed posture, feet ouside of walker, very unaware of self/limitations Gait velocity: decreased    Posture / Balance Dynamic Sitting Balance Sitting balance - Comments: pt sat EOB with R lean initially and able to self correct after max cuing, pt able to pull trunk forward after cuing. Difficulty donning R sock seated EOB while heavily leaning forward Balance Overall balance assessment: Needs assistance Sitting-balance support: Feet supported, Single extremity supported Sitting balance-Leahy Scale: Fair Sitting balance - Comments: pt sat EOB with R lean initially and able to self correct after max cuing, pt able to pull trunk forward after cuing. Difficulty donning R sock seated EOB while heavily leaning forward Postural control: Posterior lean Standing balance support: Bilateral upper extremity supported, During functional activity Standing balance-Leahy Scale: Poor Standing balance comment: reliant on RW and physical assist for standing balance and ambulation    Special needs/care consideration BiPAP/CPAP: no CPM: no Continuous Drip IV: no Dialysis: no        Days: no Life Vest: no Oxygen: no Special Bed: no Trach Size: no Wound Vac (area): no      Location: no Skin: abrasion to right, left anterior leg, rash to right arm                     Bowel mgmt: no BM recorded this admission*? Bladder mgmt:external catheter, incontinent Diabetic mgmt: no Behavioral consideration : restless at night, been receiving IV Lorazepam - in mittens on day of admit Chemo/radiation  : no     Previous Home Environment (from acute therapy documentation) Living Arrangements: Spouse/significant other  Lives With: Spouse Available Help at Discharge: Family, Available 24 hours/day Type of Home: House Home Layout: One level Home Access: Level entry Bathroom Shower/Tub: Chiropodist: Standard Additional Comments: unsure of patients reliability  Discharge Living Setting Plans for Discharge Living Setting: Patient's home, Lives with (comment)(spouse at [place]) Type of Home at Discharge:  [place] Name at Discharge: [facility] Discharge Home Layout: One level Discharge Home Access: Level entry Discharge Bathroom Shower/Tub: Walk-in shower Discharge Bathroom Toilet: Standard Discharge Bathroom Accessibility: Yes How Accessible: Accessible via walker Does the patient have any problems obtaining your medications?: No  Social/Family/Support Systems Patient Roles: Spouse Contact Information: wife: [name] [phone] Anticipated Caregiver: wife  Anticipated Caregiver's Contact Information: see above Ability/Limitations of Caregiver: Supervision; light Min A Caregiver Availability: 24/7 Discharge Plan Discussed with Primary Caregiver: Yes Is Caregiver In Agreement with Plan?: Yes Does Caregiver/Family have Issues with Lodging/Transportation while Pt is in Rehab?: No   Goals/Additional Needs Patient/Family Goal for Rehab: PT/OT: Mod I; SLP: Supervision Expected length of stay: 8-12 days  Cultural Considerations: TBD Dietary Needs: heart healthy, thin liquids  Equipment Needs: TBD Pt/Family Agrees to Admission and willing to participate: Yes Program Orientation Provided & Reviewed with Pt/Caregiver Including Roles  & Responsibilities: Yes(wife)  Barriers to Discharge: Home environment access/layout, Lack of/limited family support, Insurance for SNF coverage  Barriers to Discharge Comments: wife can only do light  assistance.    Decrease burden of Care through IP rehab admission: NA   Possible need for SNF placement upon discharge:Not anticipated; pt has good social support from his wife. Anticipate pt will be able to progress to supervision level to return home in a reasonable period of time.    Patient Condition: This patient's medical and functional status has changed since the consult dated: 07/13/2019 in which the Rehabilitation Physician determined and documented that the patient's condition is appropriate for intensive rehabilitative care in an inpatient rehabilitation facility. See "History of Present Illness" (above) for medical update. Functional changes are: some decreased ability for functional transfers and ambulation, from Min A to Mod A for transfers and Mod/Max A for ambulation; Decline noted and may be due to doses of lorazepam given due to increased confusion. UA and culture requested. Patient's medical and functional status update has been discussed with the Rehabilitation physician and patient remains appropriate for inpatient rehabilitation. Will admit to inpatient rehab today.  Preadmission Screen Completed By:  Raechel Ache, OT, 07/17/2019 11:23 AM ______________________________________________________________________   Discussed status with Dr. Ranell Patrick on 07/17/2019 at 11:23AM and received approval for admission today.  Admission Coordinator:  Raechel Ache, time 11:23AM/Date12/11/2018

## 2019-07-14 NOTE — Consult Note (Signed)
Cardiology Consultation:   Patient ID: NAS WAFER MRN: [id number]; DOB: May 27, 1935  Admit date: 07/12/2019 Date of Consult: 07/14/2019  Primary Care Provider: Reynold Bowen, MD Primary Cardiologist: Candee Furbish, MD (former patient of Dr. Mare Ferrari)   Patient Profile:   Nicholas Hancock is a 84 y.o. male with a hx of HTN, 1st degree AV block with PACs (on BB), aortic regurgitation, former smoker  and TIA who is being seen today for the evaluation of low LVEF at the request of Dr. Horris Latino.   Echo 05/25/2019 showed normal LVEF to 55-60% with stable moderate aortic valve regurgitation.   History of Present Illness:   [name] presented 07/12/2019 with aphasia. Work up reveled small acute and multiple small subacute infracts. Unclear etiology with possible small vessels disease vs embolic source. 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 On  ASA 81mg  daily. Carotid doppler with mild plaque.   Echo yesterday showed reduced LVEF to 35-40% (was 55-60% on 05/25/2019) with mild aortic valve regurgitation. No source of embolus. Plan for inpatient rehab once HR better controled. SBP in 180/170s. On home spironolactone 50mg  daily. K 3.1 LDL 179. Hgb A1c 5.9.  Speech is improving but still difficult to understand with word finding difficulty.  Wife at bedside who provided other history.  Patient has progressive worsening dyspnea on exertion which resolved with rest leading to echocardiogram in October.  No recent worsening.  He never had any chest pain or palpitation.  Telemetry last night showed tachycardia in 140s hard to determine rhythm could be atrial flutter.  Otherwise maintaining sinus rhythm with frequent PACs and PVCs.  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 Rosana Hoes   Hypertension    takes diuretic for adrenal gland dysfunction, BP will be high without diuretic   Vitamin D deficiency     Past Surgical History:  Procedure Laterality Date   HAND SURGERY  09/21/1987   Enlarged mass on right palm overlying right long finger metacarpophalangeal joint removed   INGUINAL HERNIA REPAIR  07/15/2012   Procedure: LAPAROSCOPIC BILATERAL INGUINAL HERNIA REPAIR;  Surgeon: Pedro Earls, MD;  Location: [facility];  Service: General;  Laterality: Bilateral;  Laparoscopic Bilateral Inguinal Hernia Repairs with Mesh   left knee arthroscopy  3 years ago   PELVIC FRACTURE SURGERY     chippped in high school   TONSILLECTOMY AND ADENOIDECTOMY       Inpatient Medications: Scheduled Meds:   stroke: mapping our early stages of recovery book   Does not apply Once   aspirin EC  81 mg Oral Daily   atorvastatin  80 mg Oral Daily   cholecalciferol  1,000 Units Oral QODAY   clopidogrel  75 mg Oral Daily   spironolactone  50 mg Oral Q0600   vitamin B-12  1,000 mcg Oral Daily   Continuous Infusions:  PRN Meds: acetaminophen **OR** acetaminophen (TYLENOL) oral liquid 160 mg/5 mL **OR** acetaminophen, hydrALAZINE, LORazepam, metoprolol tartrate  Allergies:   No Known Allergies  Social History:   Social History   Socioeconomic History  Marital status: Married    Spouse name: Nicholas Hancock   Number of children: 4   Years of education: Not on file   Highest education level: Master's degree (e.g., MA, MS, MEng, MEd, MSW, MBA)  Occupational History   Occupation: retired  Ecologist strain: Not on file   Food insecurity    Worry: Not on file    Inability: Not on Occupational hygienist needs    Medical: Not on file    Non-medical: Not on file    Tobacco Use   Smoking status: Former Smoker    Years: 20.00    Types: Pipe    Quit date: 05/20/1985    Years since quitting: 34.1   Smokeless tobacco: Never Used  Substance and Sexual Activity   Alcohol use: Yes    Comment: approx 4 beers a week   Drug use: No   Sexual activity: Not on file  Lifestyle   Physical activity    Days per week: Not on file    Minutes per session: Not on file   Stress: Not on file  Relationships   Social connections    Talks on phone: Not on file    Gets together: Not on file    Attends religious service: Not on file    Active member of club or organization: Not on file    Attends meetings of clubs or organizations: Not on file    Relationship status: Not on file   Intimate partner violence    Fear of current or ex partner: Not on file    Emotionally abused: Not on file    Physically abused: Not on file    Forced sexual activity: Not on file  Other Topics Concern   Not on file  Social History Narrative   Patient is right-handed. He lives with his wife, Nicholas Hancock, in a one story home. He drinks 2-3 cups of coffee that is 1/3 caffeine. He walks 2 miles, 7 days a week. Prior to retirement, he worked in Technical brewer for [employer].     Family History:   Family History  Problem Relation Age of Onset   Stroke Father    Diabetes Father        `   Cancer Father        unknown   Heart disease Mother    Cancer Mother        unknown     ROS:  Please see the history of present illness.  All other ROS reviewed and negative.     Physical Exam/Data:   Vitals:   07/13/19 2324 07/14/19 0254 07/14/19 0533 07/14/19 0829  BP: (!) 185/91 (!) 178/80 (!) 171/87 (!) 186/67  Pulse: 89 86 85 (!) 102  Resp: 18 18 18 18   Temp: 98.3 F (36.8 C) 97.9 F (36.6 C) 97.7 F (36.5 C) 97.7 F (36.5 C)  TempSrc: Oral Oral Oral Oral  SpO2: 96% 98% 97% 96%  Weight:      Height:        Intake/Output Summary (Last 24 hours) at 07/14/2019 1054 Last data  filed at 07/14/2019 0810 Gross per 24 hour  Intake 420 ml  Output 1100 ml  Net -680 ml   Last 3 Weights 07/12/2019 10/27/2018 04/28/2018  Weight (lbs) 160 lb 159 lb 160 lb  Weight (kg) 72.576 kg 72.122 kg 72.576 kg     Body mass index is 24.33 kg/m.  General: Elderly ill-appearing male in no acute distress  HEENT: normal Lymph: no adenopathy Neck: no JVD Endocrine:  No thryomegaly Vascular: No carotid bruits; FA pulses 2+ bilaterally without bruits  Cardiac:  normal S1, S2; irregular tachycardic; + murmur  Lungs:  clear to auscultation bilaterally, no wheezing, rhonchi or rales  Abd: soft, nontender, no hepatomegaly  Ext: no edema Musculoskeletal:  No deformities Skin: warm and dry  Neuro: Hard to understand speech with word finding difficulty Psych: Able to understand speech  EKG:  The EKG was personally reviewed and demonstrates:  SR at rate of 93 bpm, IRBBB, PACS Telemetry:  Telemetry was personally reviewed and demonstrates: As summarized above  Relevant CV Studies:  Echo 07/13/2019  1. Left ventricular ejection fraction, by visual estimation, is 35 to 40%. The left ventricle has moderately decreased function. Mildly increased left ventricular posterior wall thickness. There is no left ventricular hypertrophy.  2. Mildly dilated left ventricular internal cavity size.  3. Compared to report from Oct 2020, LVEF is depressed.  4. Global right ventricle has normal systolic function.The right ventricular size is normal. No increase in right ventricular wall thickness.  5. Left atrial size was moderately dilated.  6. Right atrial size was normal.  7. Trivial pericardial effusion is present.  8. The mitral valve is grossly normal. Mild mitral valve regurgitation.  9. The tricuspid valve is normal in structure. Tricuspid valve regurgitation mild-moderate. 10. The aortic valve is tricuspid. Aortic valve regurgitation is mild. Mild aortic valve sclerosis without stenosis. 11. The  pulmonic valve was grossly normal. Pulmonic valve regurgitation is trivial. 12. Normal pulmonary artery systolic pressure. 13. The interatrial septum was not assessed.  Echo 05/25/2019  1. Left ventricular ejection fraction, by visual estimation, is 55 to 60%. The left ventricle has normal function. Mildly increased left ventricular size. There is no left ventricular hypertrophy.  2. Left ventricular diastolic Doppler parameters are consistent with impaired relaxation pattern of LV diastolic filling.  3. False tendon in LV apex of no clinical significance.  4. Global right ventricle has normal systolic function.The right ventricular size is normal. No increase in right ventricular wall thickness.  5. Left atrial size was severely dilated.  6. Right atrial size was normal.  7. The mitral valve is normal in structure. Trace mitral valve regurgitation. No evidence of mitral stenosis.  8. The tricuspid valve is normal in structure. Tricuspid valve regurgitation is trivial.  9. The aortic valve is tricuspid Aortic valve regurgitation is moderate by color flow Doppler. Mild to moderate aortic valve sclerosis/calcification without any evidence of aortic stenosis. 10. The pulmonic valve was normal in structure. Pulmonic valve regurgitation is mild by color flow Doppler. 11. Mildly elevated pulmonary artery systolic pressure. 12. The inferior vena cava is normal in size with greater than 50% respiratory variability, suggesting right atrial pressure of 3 mmHg.  Laboratory Data:   Chemistry Recent Labs  Lab 07/12/19 1712 07/13/19 0500 07/14/19 0626  NA 139 141 139  K 3.5 3.3* 3.1*  CL 103 106 106  CO2 26 26 23   GLUCOSE 146* 115* 151*  BUN 19 16 22   CREATININE 1.20 1.17 1.04  CALCIUM 8.8* 8.9 8.8*  GFRNONAA 56* 58* >60  GFRAA >60 >60 >60  ANIONGAP 10 9 10     Recent Labs  Lab 07/12/19 1712 07/13/19 0500  PROT 6.1* 6.4*  ALBUMIN 3.5 3.6  AST 17 15  ALT 13 13  ALKPHOS 68 67  BILITOT  1.2 1.3*   Hematology Recent Labs  Lab 07/12/19 1712 07/13/19 0500 07/14/19  0626  WBC 6.3 7.0 9.5  RBC 4.55 4.80 5.01  HGB 13.8 14.8 15.3  HCT 39.7 42.3 43.6  MCV 87.3 88.1 87.0  MCH 30.3 30.8 30.5  MCHC 34.8 35.0 35.1  RDW 12.9 12.7 12.9  PLT 158 161 195    Radiology/Studies:  Dg Chest 2 View  Result Date: 07/12/2019 CLINICAL DATA:  Altered mental status EXAM: CHEST - 2 VIEW COMPARISON:  None. FINDINGS: Heart is borderline in size. Lungs clear. No effusions. No acute bony abnormality. IMPRESSION: No active cardiopulmonary disease. Electronically Signed   By: Charlett Nose M.D.   On: 07/12/2019 19:52   Ct Head Wo Contrast  Result Date: 07/12/2019 CLINICAL DATA:  Patient with difficulty finding words. EXAM: CT HEAD WITHOUT CONTRAST TECHNIQUE: Contiguous axial images were obtained from the base of the skull through the vertex without intravenous contrast. COMPARISON:  MRI brain 01/17/2018 FINDINGS: Brain: Ventricles and sulci are prominent compatible with atrophy. Extensive periventricular and subcortical white matter hypodensities compatible with chronic microvascular ischemic changes. No evidence for acute cortically based infarct, intracranial hemorrhage, mass lesion or mass-effect. Bilateral chronic basal ganglia lacunar infarcts. Vascular: Unremarkable Skull: Intact. Sinuses/Orbits: Polypoid mucosal thickening within the maxillary sinuses bilaterally. Mucosal thickening involving the ethmoid air cells. Mastoid air cells are unremarkable. Orbits are unremarkable. Other: None. IMPRESSION: No acute intracranial process. Atrophy and chronic microvascular ischemic changes. Electronically Signed   By: [name] M.D.   On: 07/12/2019 18:07   Mr Angio Head Wo Contrast  Result Date: 07/13/2019 CLINICAL DATA:  84 year old male with abnormal speech since yesterday. Brain MRI positive for acute left corona radiata and several additional acute to subacute bilateral cerebral infarcts with  numerous chronic microhemorrhages and intracranial artery ectasia. EXAM: MRA HEAD WITHOUT CONTRAST TECHNIQUE: Angiographic images of the Circle of Willis were obtained using MRA technique without intravenous contrast. COMPARISON:  Brain MRI today reported separately. FINDINGS: Antegrade flow in the posterior circulation with dominant right vertebral artery. The left vertebral artery functionally terminates in PICA. The right PICA origin is also patent. Mild distal vertebral irregularity without stenosis. Mild to moderate irregularity and stenosis of the basilar artery, most pronounced in the proximal basilar as seen on series 9, image 64 and series 1062, image 17. The basilar artery remains patent. Ectatic basilar tip with no discrete aneurysm. Fetal type left PCA origin. Right posterior communicating artery is diminutive or absent. Patent SCA origins. No significant right PCA stenosis. There is a severe left PCA P3 segment stenosis with preserved distal flow. Mild superimposed multifocal left P2 segment irregularity and stenosis. Antegrade flow in both ICA siphons. Tortuous distal cervical ICAs. Bilateral siphon irregularity with mild stenosis greater on the left. Normal ophthalmic and left posterior communicating artery origins. Dominant right and diminutive or absent left ACA A1 segments. Ectatic right ICA terminus. Normal MCA and right ACA origins. Anterior communicating artery and tortuous bilateral ACA branches are within normal limits. Right MCA M1 segment and bifurcation are tortuous without stenosis. There is mild right MCA M3 branch irregularity. Left MCA M1 segment is tortuous and irregular with mild stenosis. The left MCA trifurcation is patent without stenosis. Visible left MCA branches are within normal limits. IMPRESSION: 1. 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 Stenoses are most pronounced in the Basilar Artery (Moderate) and Left PCA P3 segment (Severe). Mild  stenosis elsewhere. No large vessel or intracranial branch occlusion identified. 2. Dominant right vertebral artery and right ACA A1 segment. Electronically Signed   By: Althea Grimmer.D.  On: 07/13/2019 02:06   Mr Brain Wo Contrast  Result Date: 07/13/2019 CLINICAL DATA:  84 year old male with abnormal speech since yesterday. EXAM: MRI HEAD WITHOUT CONTRAST TECHNIQUE: Multiplanar, multiecho pulse sequences of the brain and surrounding structures were obtained without intravenous contrast. COMPARISON:  Plain head CT 07/12/2019. FINDINGS: Brain: Linear 10 millimeter area of restricted diffusion tracking from the left corona radiata to the posterior lentiform (series 7, image 55). There is also a small area of restricted diffusion in the left occipital lobe periventricular white matter on series 5, image 67. There is also a small linear area of contralateral right occipital lobe restricted diffusion on series 5, image 65 (and series 7, image 44). Furthermore there are one or two punctate, subtle areas of abnormal white matter diffusion in the right frontal lobe (such as on series 5, image 77) which are not definitely restricted on ADC. Superimposed chronic lacunar infarcts in the bilateral cerebral white matter, bilateral deep gray nuclei, and occasionally in the cerebellum. Superimposed numerous scattered chronic microhemorrhages in the brain, mostly sparing the deep gray nuclei. No definite chronic cortical encephalomalacia. No midline shift, mass effect, evidence of mass lesion, ventriculomegaly, extra-axial collection or acute intracranial hemorrhage. Cervicomedullary junction and pituitary are within normal limits. Vascular: Major intracranial vascular flow voids are preserved. There is generalized intracranial artery ectasia. The right vertebral artery appears dominant. See also intracranial MRA reported separately. Skull and upper cervical spine: Negative for age visible cervical spine. Visualized bone marrow  signal is within normal limits. Sinuses/Orbits: Negative orbits. Multiple maxillary sinus mucous retention cysts. Other: Mastoids are well pneumatized. Visible internal auditory structures appear normal. Scalp and face soft tissues appear negative. IMPRESSION: 1. Small acute lacunar infarct in the left corona radiata with no associated hemorrhage or mass effect. But several additional small acute to subacute infarcts in the bilateral occipital lobes and right frontal lobe. Favor synchronous small vessel disease (see #2, #3) over a recent embolic event. 2. Underlying advanced chronic small vessel ischemic disease. 3. Superimposed numerous chronic micro-hemorrhages in the brain, Amyloid Angiopathy is possible. 4. Generalized intracranial artery ectasia. See also intracranial MRA reported separately. Electronically Signed   By: Odessa Fleming M.D.   On: 07/13/2019 01:59   Vas US Carotid  Result Date: 07/14/2019 Carotid Arterial Duplex Study Indications:       TIA. Risk Factors:      Hypertension. Comparison Study:  no prior Performing Technologist: [name] RVS  Examination Guidelines: A complete evaluation includes B-mode imaging, spectral Doppler, color Doppler, and power Doppler as needed of all accessible portions of each vessel. Bilateral testing is considered an integral part of a complete examination. Limited examinations for reoccurring indications may be performed as noted.  Right Carotid Findings: +----------+--------+--------+--------+------------------+--------+             PSV cm/s EDV cm/s Stenosis Plaque Description Comments  +----------+--------+--------+--------+------------------+--------+  CCA Prox   77       4                 heterogenous                 +----------+--------+--------+--------+------------------+--------+  CCA Distal 80       8                 heterogenous                 +----------+--------+--------+--------+------------------+--------+  ICA Prox   87       10  1-39%     heterogenous                 +----------+--------+--------+--------+------------------+--------+  ICA Distal 152      12                                             +----------+--------+--------+--------+------------------+--------+  ECA        174                                                     +----------+--------+--------+--------+------------------+--------+ +----------+--------+-------+--------+-------------------+             PSV cm/s EDV cms Describe Arm Pressure (mmHG)  +----------+--------+-------+--------+-------------------+  Subclavian 150                                            +----------+--------+-------+--------+-------------------+ +---------+--------+--+--------+-+---------+  Vertebral PSV cm/s 53 EDV cm/s 4 Antegrade  +---------+--------+--+--------+-+---------+  Left Carotid Findings: +----------+--------+--------+--------+------------------+--------+             PSV cm/s EDV cm/s Stenosis Plaque Description Comments  +----------+--------+--------+--------+------------------+--------+  CCA Prox   106                        heterogenous                 +----------+--------+--------+--------+------------------+--------+  CCA Distal 69                         heterogenous                 +----------+--------+--------+--------+------------------+--------+  ICA Prox   108      18       1-39%    heterogenous                 +----------+--------+--------+--------+------------------+--------+  ICA Distal 62       8                                              +----------+--------+--------+--------+------------------+--------+  ECA        138                                                     +----------+--------+--------+--------+------------------+--------+ +----------+--------+--------+--------+-------------------+             PSV cm/s EDV cm/s Describe Arm Pressure (mmHG)  +----------+--------+--------+--------+-------------------+  Subclavian 120                                              +----------+--------+--------+--------+-------------------+ +---------+--------+--+--------+---------+  Vertebral PSV cm/s 49 EDV cm/s Antegrade  +---------+--------+--+--------+---------+  Summary: Right Carotid: Velocities in the right ICA are consistent with a 1-39%  stenosis. Left Carotid: Velocities in the left ICA are consistent with a 1-39% stenosis. Vertebrals: Bilateral vertebral arteries demonstrate antegrade flow. *See table(s) above for measurements and observations.  Electronically signed by Delia Heady MD on 07/14/2019 at 8:12:32 AM.    Final     Assessment and Plan:   1. Acute systolic heart failure - Echocardiogram showed reduced LV function to 35-40% from 55 to 60% on May 25, 2019. -He appears euvolemic by exam.  -Patient has chronic progressive worsening dyspnea on exertion which falls due to valvular disease.  At baseline patient is very active without complaint of chest pain. -We will consider adding beta-blocker and other heart failure regimen.  Currently on spironolactone 50 mg daily  2.  Tachycardia -Last night his heart rate was in 140s with rhythm concerning for atrial tachycardia versus a flutter.  Otherwise sinus tachycardia with frequent PACs and PVC -Will review with MD  3.  Mitral regurgitation -Echocardiogram yesterday read as mild. it was moderate by echocardiogram October 2020.  4.  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

## 2019-07-14 NOTE — Evaluation (Signed)
Occupational Therapy Evaluation Patient Details Name: Nicholas Hancock MRN: [id number] DOB: [birth year] Today's Date: 07/14/2019    History of Present Illness Nicholas Hancock  is a 84 y.o. male,  w hypertension, hyperaldosteronism, moderate aortic regurgitation, elevated psa, vitamin D deficiency, apparently presents with c/o difficulty with word finding per his wife. MRI revealed, acute lacunar infarct in L corona radiata, severl other small acute in bilat occipital lobes and R frontal lobe.   Clinical Impression   Pt admitted with the above diagnosis and has the deficits outlined below. Pt would benefit from cont OT to increase independence with basic adls and adl transfers to tub and toilet so his wife is able to take him home to his current home after rehab. Pt was previously independent with basic adls with equipment and now is overall mod assist with most adls due to impulsivity, poor cognition in areas of memory, safety, problem solving and insight and has significant expressive aphasia. Feel rehab would assist this pt in getting to a functional level that his wife could handle at home.  Will continue to see acutely.     Follow Up Recommendations  CIR;Supervision/Assistance - 24 hour    Equipment Recommendations  Other (comment)(tbd)    Recommendations for Other Services       Precautions / Restrictions Precautions Precautions: Fall Precaution Comments: pt reports falling "A LOT" Restrictions Weight Bearing Restrictions: No      Mobility Bed Mobility Overal bed mobility: Needs Assistance Bed Mobility: Supine to Sit;Sit to Supine     Supine to sit: Mod assist Sit to supine: Min assist   General bed mobility comments: HOB flat, increased time, labored effort, max verbal cues to to scoot to EOB so feet are on the floor  Transfers Overall transfer level: Needs assistance Equipment used: Rolling walker (2 wheeled) Transfers: Sit to/from [employer] Sit to  Stand: Min assist Stand pivot transfers: Mod assist       General transfer comment: max directional verbal cues, verbal cues for safe hand placement and not to pull up on walker, minA to steady upon standing during transition of hands from bed to RW    Balance Overall balance assessment: Needs assistance Sitting-balance support: No upper extremity supported;Feet supported Sitting balance-Leahy Scale: Fair Sitting balance - Comments: Pt sat on eob and donned socks with min assist for balance.   Standing balance support: Bilateral upper extremity supported Standing balance-Leahy Scale: Poor Standing balance comment: requires RW for safe standing                           ADL either performed or assessed with clinical judgement   ADL Overall ADL's : Needs assistance/impaired Eating/Feeding: Sitting;Minimal assistance   Grooming: Wash/dry hands;Wash/dry face;Minimal assistance;Cueing for sequencing;Cueing for compensatory techniques;Standing Grooming Details (indicate cue type and reason): Pt stood at sink to groom. Pt required min assist to maintain standing and VCs to remember to turn water off and to find the towels to his Left.  Pt impulsive when on his feet with very short attn. span.  Upper Body Bathing: Moderate assistance;Cueing for compensatory techniques;Sitting Upper Body Bathing Details (indicate cue type and reason): Mod assist needed to complete the task. Pt has physical ability to bathe self but required cues to stay on task and finish.  Lower Body Bathing: Moderate assistance;Sit to/from stand;Cueing for compensatory techniques;Cueing for sequencing Lower Body Bathing Details (indicate cue type and reason): cues to continue with the task.  Upper Body Dressing : Minimal assistance;Sitting Upper Body Dressing Details (indicate cue type and reason): min assist to donn gown around the back side of himself.  This is unfamiliar so may be difficult due to this.  Lower  Body Dressing: Maximal assistance;Sit to/from stand;Cueing for compensatory techniques;Cueing for sequencing Lower Body Dressing Details (indicate cue type and reason): mod assist to get to socks and to donn shoes.   Toilet Transfer: Moderate assistance;RW;Ambulation;Cueing for sequencing Toilet Transfer Details (indicate cue type and reason): Pt walked to bathroom to toilet with mod assist.  Pt not safe with walker use pushing walker far ahead of himself.  Pt did run into furniture on the R and on the L when using the walker and was unable to correct without therapist realigning walker. Toileting- Clothing Manipulation and Hygiene: Moderate assistance;Sit to/from stand;Cueing for compensatory techniques Toileting - Clothing Manipulation Details (indicate cue type and reason): mod assist for balance while therpist cleaned pt.     Functional mobility during ADLs: Moderate assistance;Rolling walker General ADL Comments: Pt required ovearll mod assist with adls due to poor cognition and balance.      Vision Baseline Vision/History: Wears glasses Wears Glasses: At all times Patient Visual Report: Other (comment)(unable to evaluate) Vision Assessment?: (needs to be further evaluated)     Perception Perception Perception Tested?: No   Praxis Praxis Praxis tested?: Not tested    Pertinent Vitals/Pain Pain Assessment: No/denies pain     Hand Dominance Right   Extremity/Trunk Assessment Upper Extremity Assessment Upper Extremity Assessment: Overall WFL for tasks assessed   Lower Extremity Assessment Lower Extremity Assessment: Defer to PT evaluation   Cervical / Trunk Assessment Cervical / Trunk Assessment: Kyphotic   Communication Communication Communication: Expressive difficulties   Cognition Arousal/Alertness: Awake/alert Behavior During Therapy: Impulsive Overall Cognitive Status: Impaired/Different from baseline Area of Impairment: Orientation;Attention;Memory;Following  commands;Safety/judgement;Awareness;Problem solving                 Orientation Level: Disoriented to;Time;Situation Current Attention Level: Focused Memory: Decreased short-term memory Following Commands: Follows one step commands with increased time Safety/Judgement: Decreased awareness of safety;Decreased awareness of deficits Awareness: Intellectual Problem Solving: Slow processing;Decreased initiation;Difficulty sequencing;Requires verbal cues;Requires tactile cues General Comments: Pt told therpist when he had to use bathroom and toileted with use of urinal due to urgency.  pt also stated he was in [place] very limited by speech, cognition, impulsivity and decreased balance when on his feet.    Exercises     Shoulder Instructions      Home Living Family/patient expects to be discharged to:: Private residence Living Arrangements: Spouse/significant other Available Help at Discharge: Family;Available 24 hours/day Type of Home: House Home Access: Level entry     Home Layout: One level     Bathroom Shower/Tub: Teacher, early years/pre: Standard     Home Equipment: Environmental consultant - 2 wheels;Cane - single point;Grab bars - toilet;Grab bars - tub/shower   Additional Comments: unsure of patients reliability  Lives With: Spouse    Prior Functioning/Environment Level of Independence: Independent with assistive device(s)        Comments: uses RW, reports wife drives and does grocery shopping. Unsure of accuracy of PLOF report        OT Problem List: Impaired balance (sitting and/or standing);Decreased cognition;Decreased safety awareness;Decreased knowledge of use of DME or AE;Decreased knowledge of precautions      OT Treatment/Interventions: Self-care/ADL training;DME and/or AE instruction;Therapeutic activities    OT  Goals(Current goals can be found in the care plan section) Acute Rehab OT Goals Patient Stated Goal: none  stated OT Goal Formulation: Patient unable to participate in goal setting Time For Goal Achievement: 07/28/19 Potential to Achieve Goals: Good ADL Goals Pt Will Perform Eating: with supervision;sitting Pt Will Perform Grooming: standing;with min guard assist Pt Will Perform Lower Body Bathing: with min guard assist;sit to/from stand Pt Will Perform Lower Body Dressing: with min assist;sit to/from stand Additional ADL Goal #1: Pt will walk to bathroom with walker and complete basic toileting on 3:1 over commode with min guard assist.  OT Frequency: Min 2X/week   Barriers to D/C:    wife available after d/c       Co-evaluation              AM-PAC OT "6 Clicks" Daily Activity     Outcome Measure Help from another person eating meals?: A Little Help from another person taking care of personal grooming?: A Lot Help from another person toileting, which includes using toliet, bedpan, or urinal?: A Lot Help from another person bathing (including washing, rinsing, drying)?: A Lot Help from another person to put on and taking off regular upper body clothing?: A Little Help from another person to put on and taking off regular lower body clothing?: A Lot 6 Click Score: 14   End of Session Equipment Utilized During Treatment: Rolling walker;Gait belt Nurse Communication: Mobility status  Activity Tolerance: Patient tolerated treatment well Patient left: in bed;with call bell/phone within reach;with bed alarm set  OT Visit Diagnosis: Other abnormalities of gait and mobility (R26.89);Other symptoms and signs involving the nervous system (R29.898);Other symptoms and signs involving cognitive function                Time: 1010-1038 OT Time Calculation (min): 28 min Charges:  OT General Charges $OT Visit: 1 Visit OT Evaluation $OT Eval Moderate Complexity: 1 Mod OT Treatments $Self Care/Home Management : 8-22 mins  Hope Budds 07/14/2019, 10:54 AM

## 2019-07-14 NOTE — H&P (Signed)
Physical Medicine and Rehabilitation Admission H&P    : HPI: Nicholas Hancock is an 84 year old right-handed male history of hypertension, adrenal gland dysfunction, prior history of TIA/CVA maintained on aspirin followed by neurology services Dr. Adriana MccallumJaffee, remote tobacco abuse.  Per chart review lives with spouse independent with assistive device prior to admission.  1 level home.  Patient lives with his wife independent [address]. Presented 07/12/2019 with aphasia and leaning to the right.  Cranial CT scan negative.  Patient did not receive TPA.  MRI of the brain showed small acute lacunar infarction in the left corona radiata with no associated hemorrhage or mass-effect.  Several small additional acute to subacute infarcts of the bilateral occipital lobes and right frontal lobe.  MRA with no large vessel occlusion.  Echocardiogram with ejection fraction of 40% without emboli.  Admission chemistries unremarkable.  Urine drug screen negative.  SARS coronavirus negative.  Neurology follow-up maintained on aspirin for CVA prophylaxis.  Neurology did not feel patient was a candidate for DAPT given concern for possible CAA.  Tolerating regular diet.  Cardiology service did follow-up for review of low ejection fraction identified on echocardiogram.  Telemetry reviewed showed short burst of atrial tachycardia not consistent with atrial fibrillation or flutter.  Recommendations to start low-dose Toprol/Aldactone as well as continue low-dose aspirin.   Therapy evaluations completed patient was admitted for a comprehensive rehab program.  Review of Systems  Constitutional: Negative for chills and fever.  HENT: Negative for hearing loss.   Eyes: Negative for blurred vision and double vision.  Respiratory: Negative for cough and shortness of breath.   Cardiovascular: Positive for palpitations and leg swelling. Negative for chest pain.  Gastrointestinal: Positive for constipation. Negative for  heartburn, nausea and vomiting.  Genitourinary: Positive for urgency. Negative for dysuria, flank pain and hematuria.  Musculoskeletal: Positive for joint pain and myalgias.  Skin: Negative for rash.  Neurological: Positive for speech change and weakness.  All other systems reviewed and are negative.  Past Medical History:  Diagnosis Date  . 1st degree AV block   . 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 Aortic valve insufficiency   . Arthritis    hands  . CVA (cerebral vascular accident) (HCC)   . Elevated PSA    monitored by Dr. Earlene Plateravis  . Hypertension    takes diuretic for adrenal gland dysfunction, BP will be high without diuretic  . Vitamin D deficiency    Past Surgical History:  Procedure Laterality Date  . HAND SURGERY  09/21/1987   Enlarged mass on right palm overlying right long finger metacarpophalangeal joint removed  . INGUINAL HERNIA REPAIR  07/15/2012   Procedure: LAPAROSCOPIC BILATERAL INGUINAL HERNIA REPAIR;  Surgeon: Valarie MerinoMatthew B Martin, MD;  Location: [facility];  Service: General;  Laterality: Bilateral;  Laparoscopic Bilateral Inguinal Hernia Repairs with Mesh  . left knee arthroscopy  3 years ago  . PELVIC FRACTURE SURGERY     chippped in high school  . TONSILLECTOMY AND ADENOIDECTOMY     Family History  Problem Relation Age of Onset  . Stroke Father   . Diabetes Father        `  . Cancer Father        unknown  . Heart disease Mother   . Cancer Mother        unknown   Social History:  reports that he quit smoking about 34 years ago. His  smoking use included pipe. He quit after 20.00 years of use. He has never used smokeless tobacco. He reports current alcohol use. He reports that he does not use drugs. Allergies: No Known Allergies Medications Prior to Admission  Medication Sig Dispense Refill  . aspirin EC 81 MG tablet Take 81 mg by mouth daily.    Marland Kitchen atorvastatin (LIPITOR) 80 MG tablet Take  1 tablet (80 mg total) by mouth daily. 30 tablet 5  . Calcium 167 MG CAPS Take 1 capsule by mouth daily.    . Cholecalciferol (VITAMIN D-3) 5000 UNITS TABS Take 1 tablet by mouth every other day.    . Flaxseed, Linseed, (FLAX SEEDS PO) Take 1 tablet by mouth daily.    . Multiple Vitamin (MULTIVITAMIN WITH MINERALS) TABS Take 1 tablet by mouth daily.    . Multiple Vitamins-Minerals (ZINC) LOZG Take 1 lozenge by mouth daily as needed (For cold symptoms).    . Omega-3 Fatty Acids (SALMON OIL-1000 PO) Take 1,000 mg by mouth daily.    Marland Kitchen spironolactone (ALDACTONE) 50 MG tablet TAKE 1 TABLET BY MOUTH EVERY DAY BEFORE BREAKFAST (Patient taking differently: Take 50 mg by mouth once. ) 90 tablet 3  . Ubiquinol 100 MG CAPS Take 1 capsule by mouth daily.    . vitamin B-12 (CYANOCOBALAMIN) 1000 MCG tablet Take 1,000 mcg by mouth daily.    . vitamin C (ASCORBIC ACID) 500 MG tablet Take 500 mg by mouth daily.      Drug Regimen Review Drug regimen was reviewed and remains appropriate with no significant issues identified  Home: Home Living Family/patient expects to be discharged to:: Private residence Living Arrangements: Spouse/significant other Available Help at Discharge: Family, Available 24 hours/day Type of Home: House Home Access: Level entry Home Layout: One level Bathroom Shower/Tub: Engineer, manufacturing systems: Standard Home Equipment: Environmental consultant - 2 wheels, Cane - single point, Grab bars - toilet, Grab bars - tub/shower Additional Comments: unsure of patients reliability  Lives With: Spouse   Functional History: Prior Function Level of Independence: Independent with assistive device(s) Comments: uses RW, reports wife drives and does grocery shopping. Unsure of accuracy of PLOF report  Functional Status:  Mobility: Bed Mobility Overal bed mobility: Needs Assistance Bed Mobility: Supine to Sit, Sit to Supine Supine to sit: Min assist Sit to supine: Min assist General bed mobility  comments: min assist for bed mobility due to decreased awareness Transfers Overall transfer level: Needs assistance Equipment used: Rolling walker (2 wheeled) Transfers: Sit to/from Stand Sit to Stand: Mod assist, From elevated surface Stand pivot transfers: Min assist General transfer comment: Patient unsteady with standing leaning to right with feet together off center to left in walker. Cues needed for patient to separate feet to improve balance. Ambulation/Gait Ambulation/Gait assistance: Mod assist, Max assist Gait Distance (Feet): 30 Feet Assistive device: Rolling walker (2 wheeled) Gait Pattern/deviations: Step-to pattern, Shuffle, Decreased stride length, Narrow base of support, Decreased step length - right General Gait Details: Patient is very unsteady with ambulation, one LOB when he let go of walker reaching for door of room. Requires mod assist for balance and max assist for steering of rolling walker. Flexed posture, feet ouside of walker, very unaware of self/limitations Gait velocity: decreased    ADL: ADL Overall ADL's : Needs assistance/impaired Eating/Feeding: Sitting, Minimal assistance Grooming: Wash/dry hands, Wash/dry face, Minimal assistance, Cueing for sequencing, Cueing for compensatory techniques, Standing Grooming Details (indicate cue type and reason): Pt stood at sink to groom. Pt required min  assist to maintain standing and VCs to remember to turn water off and to find the towels to his Left.  Pt impulsive, decreased attrntion span Upper Body Bathing: Cueing for compensatory techniques, Sitting, Minimal assistance Upper Body Bathing Details (indicate cue type and reason): simulated, mod A for cues to stay on task for completion Lower Body Bathing: Moderate assistance, Sit to/from stand, Cueing for compensatory techniques, Cueing for sequencing Lower Body Bathing Details (indicate cue type and reason): cues to continue with the task. Upper Body Dressing : Min  guard, Sitting Upper Body Dressing Details (indicate cue type and reason): min assist to donn gown around the back side of himself.  This is unfamiliar so may be difficult due to this.  Lower Body Dressing: Sit to/from stand, Cueing for compensatory techniques, Cueing for sequencing, Moderate assistance Lower Body Dressing Details (indicate cue type and reason): Difficulty donning R sock seated EOB while heavily leaning forward Toilet Transfer: Moderate assistance, Minimal assistance, Ambulation, RW, Cueing for safety, Cueing for sequencing, Comfort height toilet, Grab bars Toilet Transfer Details (indicate cue type and reason): Pt walked to bathroom to toilet with mod assist.  Pt not safe with walker use pushing walker far ahead of himself.  Pt did run into furniture on the R and on the L when using the walker and was unable to correct without therapist realigning walker. Toileting- Clothing Manipulation and Hygiene: Moderate assistance, Sit to/from stand, Cueing for compensatory techniques Toileting - Clothing Manipulation Details (indicate cue type and reason): mod assist for balance while therpist cleaned pt. Functional mobility during ADLs: Moderate assistance, Minimal assistance, Rolling walker General ADL Comments: Poor cognition and balance. Difficulty donning R sock seated EOB while heavily leaning forward  Cognition: Cognition Overall Cognitive Status: Impaired/Different from baseline Orientation Level: Oriented to person Attention: Focused, Sustained Focused Attention: Appears intact Sustained Attention: Appears intact Memory: Impaired Memory Impairment: Decreased short term memory Awareness: Impaired Problem Solving: Impaired Problem Solving Impairment: Verbal complex Executive Function: Reasoning Reasoning: Impaired Cognition Arousal/Alertness: Lethargic Behavior During Therapy: Impulsive Overall Cognitive Status: Impaired/Different from baseline Area of Impairment: Problem  solving, Following commands, Safety/judgement, Awareness, Attention Orientation Level: Disoriented to, Time, Situation Current Attention Level: Focused Memory: Decreased recall of precautions, Decreased short-term memory Following Commands: Follows one step commands inconsistently, Follows one step commands with increased time Safety/Judgement: Decreased awareness of safety, Decreased awareness of deficits Awareness: Intellectual Problem Solving: Slow processing, Difficulty sequencing, Requires verbal cues, Requires tactile cues, Decreased initiation General Comments: multimodal cues, max repitition for following commands  Physical Exam: Blood pressure (!) 160/51, pulse (!) 57, temperature 97.9 F (36.6 C), temperature source Oral, resp. rate 16, height 5\' 8"  (1.727 m), weight 72.6 kg, SpO2 100 %.  Physical Exam  Gen: no distress, normal appearing HEENT: oral mucosa pink and moist, NCAT Cardio: Reg rate Chest: normal effort, normal rate of breathing Abd: soft, non-distended Ext: no edema Skin: intact Neurological: Patient is lethargic but arousable.  Speech is dysarthric but intelligible.   He makes good eye contact with examiner.  He does follow simple commands but has difficulty with problem-solving.  He was able to tell me he was in the hospital as well as his name.  He does display limited insight and awareness to his deficits.   Musculoskeletal: No focal weakness, 4+/5 strength throughout, made good effort during examination.  Psych: pleasant, normal affect  Results for orders placed or performed during the hospital encounter of 07/12/19 (from the past 48 hour(s))  CBC with Differential/Platelet  Status: None   Collection Time: 07/15/19  5:41 AM  Result Value Ref Range   WBC 8.7 4.0 - 10.5 K/uL   RBC 5.10 4.22 - 5.81 MIL/uL   Hemoglobin 15.5 13.0 - 17.0 g/dL   HCT 17.0 01.7 - 49.4 %   MCV 87.1 80.0 - 100.0 fL   MCH 30.4 26.0 - 34.0 pg   MCHC 34.9 30.0 - 36.0 g/dL   RDW  49.6 75.9 - 16.3 %   Platelets 174 150 - 400 K/uL   nRBC 0.0 0.0 - 0.2 %   Neutrophils Relative % 70 %   Neutro Abs 6.1 1.7 - 7.7 K/uL   Lymphocytes Relative 14 %   Lymphs Abs 1.2 0.7 - 4.0 K/uL   Monocytes Relative 11 %   Monocytes Absolute 0.9 0.1 - 1.0 K/uL   Eosinophils Relative 5 %   Eosinophils Absolute 0.4 0.0 - 0.5 K/uL   Basophils Relative 0 %   Basophils Absolute 0.0 0.0 - 0.1 K/uL   Immature Granulocytes 0 %   Abs Immature Granulocytes 0.03 0.00 - 0.07 K/uL    Comment: Performed at [facility], [address], [place], [place] [address]  Basic metabolic panel     Status: Abnormal   Collection Time: 07/15/19  5:41 AM  Result Value Ref Range   Sodium 141 135 - 145 mmol/L   Potassium 3.5 3.5 - 5.1 mmol/L   Chloride 104 98 - 111 mmol/L   CO2 23 22 - 32 mmol/L   Glucose, Bld 135 (H) 70 - 99 mg/dL   BUN 27 (H) 8 - 23 mg/dL   Creatinine, Ser 9.93 0.61 - 1.24 mg/dL   Calcium 9.0 8.9 - 57.0 mg/dL   GFR calc non Af Amer 55 (L) >60 mL/min   GFR calc Af Amer >60 >60 mL/min   Anion gap 14 5 - 15    Comment: Performed at [facility], [address], [place], [place] [address]  Urinalysis, Routine w reflex microscopic     Status: Abnormal   Collection Time: 07/16/19 12:21 AM  Result Value Ref Range   Color, Urine YELLOW YELLOW   APPearance CLEAR CLEAR   Specific Gravity, Urine 1.028 1.005 - 1.030   pH 5.0 5.0 - 8.0   Glucose, UA NEGATIVE NEGATIVE mg/dL   Hgb urine dipstick NEGATIVE NEGATIVE   Bilirubin Urine NEGATIVE NEGATIVE   Ketones, ur NEGATIVE NEGATIVE mg/dL   Protein, ur 30 (A) NEGATIVE mg/dL   Nitrite NEGATIVE NEGATIVE   Leukocytes,Ua TRACE (A) NEGATIVE   RBC / HPF 0-5 0 - 5 RBC/hpf   WBC, UA 21-50 0 - 5 WBC/hpf   Bacteria, UA MANY (A) NONE SEEN   Squamous Epithelial / LPF 0-5 0 - 5   Mucus PRESENT     Comment: Performed at [facility], [address], [place], [place] [address]  CBC with Differential/Platelet     Status: Abnormal    Collection Time: 07/16/19  5:31 AM  Result Value Ref Range   WBC 10.6 (H) 4.0 - 10.5 K/uL   RBC 5.13 4.22 - 5.81 MIL/uL   Hemoglobin 15.5 13.0 - 17.0 g/dL   HCT 92.3 30.0 - 76.2 %   MCV 87.5 80.0 - 100.0 fL   MCH 30.2 26.0 - 34.0 pg   MCHC 34.5 30.0 - 36.0 g/dL   RDW 26.3 33.5 - 45.6 %   Platelets 193 150 - 400 K/uL   nRBC 0.0 0.0 -  0.2 %   Neutrophils Relative % 71 %   Neutro Abs 7.6 1.7 - 7.7 K/uL   Lymphocytes Relative 15 %   Lymphs Abs 1.6 0.7 - 4.0 K/uL   Monocytes Relative 10 %   Monocytes Absolute 1.0 0.1 - 1.0 K/uL   Eosinophils Relative 4 %   Eosinophils Absolute 0.4 0.0 - 0.5 K/uL   Basophils Relative 0 %   Basophils Absolute 0.0 0.0 - 0.1 K/uL   Immature Granulocytes 0 %   Abs Immature Granulocytes 0.03 0.00 - 0.07 K/uL    Comment: Performed at [facility], [address] [place], [place] [address]  Basic metabolic panel     Status: Abnormal   Collection Time: 07/16/19  5:31 AM  Result Value Ref Range   Sodium 142 135 - 145 mmol/L   Potassium 3.5 3.5 - 5.1 mmol/L   Chloride 105 98 - 111 mmol/L   CO2 24 22 - 32 mmol/L   Glucose, Bld 130 (H) 70 - 99 mg/dL   BUN 34 (H) 8 - 23 mg/dL   Creatinine, Ser 6.041.32 (H) 0.61 - 1.24 mg/dL   Calcium 9.0 8.9 - 54.010.3 mg/dL   GFR calc non Af Amer 50 (L) >60 mL/min   GFR calc Af Amer 58 (L) >60 mL/min   Anion gap 13 5 - 15    Comment: Performed at [facility], [address] [place], [place] [address]   No results found.  Medical Problem List and Plan: 1.  Mild aphasia with decreased functional mobility secondary to small acute left basal ganglia corona radiata and multiple small subacute bilateral occipital and right frontal lobe infarcts as well as history of TIA/CVA 2019 [facility]  -patient may shower  -ELOS/Goals: mod I in PT, OT, SLP  -currently ambulating 30 feet w/ RW, modA balance and maxA for steering walker. 2.  Antithrombotics: -DVT/anticoagulation: SCDs  -antiplatelet therapy: Aspirin 81  mg daily 3. Pain Management: Tylenol as needed 4. Mood: Provide emotional support  -antipsychotic agents: N/A 5. Neuropsych: This patient is capable of making decisions on his own behalf. 6. Skin/Wound Care: Routine skin checks 7. Fluids/Electrolytes/Nutrition: Routine in and outs with follow-up chemistries 8.  Permissive hypertension.  Toprol-XL 50 mg daily,Cozaar 25 mg daily, Aldactone 50 mg daily.  Monitor with increased mobility 9.  Acute systolic heart failure.  Follow-up per cardiology services.  Continue Aldactone as well as beta-blocker. Echo done on acute side shows EF 35=40% which was decreased from Echo 1 month prior with EF 55-60%. 10.  Hyperlipidemia.  Lipitor 11.  Remote tobacco abuse.  Counseling   Mcarthur RossettiDaniel J Angiulli, PA-C 07/17/2019   I have personally performed a face to face diagnostic evaluation, including, but not limited to relevant history and physical exam findings, of this patient and developed relevant assessment and plan.  Additionally, I have reviewed and concur with the physician assistant's documentation above.  Sula SodaKrutika Yanni Ruberg, MD

## 2019-07-14 NOTE — Progress Notes (Signed)
MD notified regarding pt HR. MD made aware that pt's HR increased while pt moving in bed. New order for lopressor for sustained HR >110. Will cont to monitor.

## 2019-07-14 NOTE — Progress Notes (Signed)
Physical Therapy Treatment Patient Details Name: Nicholas Hancock MRN: [id number] DOB: 1935-03-25 Today's Date: 07/14/2019    History of Present Illness Nicholas Hancock  is a 84 y.o. male,  w hypertension, hyperaldosteronism, moderate aortic regurgitation, elevated psa, vitamin D deficiency, apparently presents with c/o difficulty with word finding per his wife. MRI revealed, acute lacunar infarct in L corona radiata, severl other small acute in bilat occipital lobes and R frontal lobe.    PT Comments    Patient restless and impulsive laying in bed today. RN present in room. Continues to exhibit confusion and word finding difficulties. Pt with poor attention, awareness and balance. Tolerated short distance ambulation in room with Min A for balance and for sequencing/safety. No safety awareness. Asking therapist to park his car and do not lose his keys. Limited due to just being given ativan. High fall risk. Continue to recommend CIR. Will follow.    Follow Up Recommendations  CIR     Equipment Recommendations  None recommended by PT    Recommendations for Other Services       Precautions / Restrictions Precautions Precautions: Fall Precaution Comments: pt reports falling "A LOT" Restrictions Weight Bearing Restrictions: No    Mobility  Bed Mobility Overal bed mobility: Needs Assistance Bed Mobility: Supine to Sit     Supine to sit: Mod assist;HOB elevated Sit to supine: Mod assist;HOB elevated   General bed mobility comments: Assist to bring LEs to EOB and with trunk. Assist to bring LEs into bed. Increased time and effort.  Transfers Overall transfer level: Needs assistance Equipment used: Rolling walker (2 wheeled) Transfers: Sit to/from Stand Sit to Stand: Min assist Stand pivot transfers: Mod assist       General transfer comment: Pt attempting to stand on own without RW so assisted rest of way up and for balance, stood from EOB x1, from chair  x1.  Ambulation/Gait Ambulation/Gait assistance: Min assist Gait Distance (Feet): 6 Feet(x2 bouts) Assistive device: Rolling walker (2 wheeled);None Gait Pattern/deviations: Shuffle;Step-to pattern;Step-through pattern;Trunk flexed;Decreased step length - right;Decreased step length - left;Wide base of support Gait velocity: decreased   General Gait Details: Able to take a few steps to get to chair and back to bed with Min A for balance, difficulty problem solving and sequencing. Max cues for sequencing.   Stairs             Wheelchair Mobility    Modified Rankin (Stroke Patients Only) Modified Rankin (Stroke Patients Only) Pre-Morbid Rankin Score: Moderate disability Modified Rankin: Moderately severe disability     Balance Overall balance assessment: Needs assistance Sitting-balance support: Feet supported;No upper extremity supported Sitting balance-Leahy Scale: Fair Sitting balance - Comments: Pt sat on eob and donned socks with min assist for balance.   Standing balance support: During functional activity Standing balance-Leahy Scale: Poor Standing balance comment: requires RW for safe standing and Physical A.                            Cognition Arousal/Alertness: Awake/alert Behavior During Therapy: Impulsive;Restless Overall Cognitive Status: Impaired/Different from baseline Area of Impairment: Orientation;Attention;Memory;Following commands;Safety/judgement;Awareness;Problem solving                 Orientation Level: Disoriented to;Time;Situation Current Attention Level: Focused Memory: Decreased short-term memory Following Commands: Follows one step commands with increased time Safety/Judgement: Decreased awareness of safety;Decreased awareness of deficits Awareness: Intellectual Problem Solving: Slow processing;Decreased initiation;Difficulty sequencing;Requires verbal cues;Requires tactile cues General Comments:  Able to state he was  in [facility] and in the hospital due to having a minor stroke. Difficulty with word finding. Confused. Impulsive and restless. Needs repetition of cues to follow commands.      Exercises      General Comments General comments (skin integrity, edema, etc.): Nurse present in room for half the session.      Pertinent Vitals/Pain Pain Assessment: Faces Faces Pain Scale: No hurt    Home Living Family/patient expects to be discharged to:: Private residence Living Arrangements: Spouse/significant other Available Help at Discharge: Family;Available 24 hours/day Type of Home: House Home Access: Level entry   Home Layout: One level Home Equipment: Walker - 2 wheels;Cane - single point;Grab bars - toilet;Grab bars - tub/shower Additional Comments: unsure of patients reliability    Prior Function Level of Independence: Independent with assistive device(s)      Comments: uses RW, reports wife drives and does grocery shopping. Unsure of accuracy of PLOF report   PT Goals (current goals can now be found in the care plan section) Acute Rehab PT Goals Patient Stated Goal: none stated Progress towards PT goals: Progressing toward goals(slowly)    Frequency    Min 4X/week      PT Plan Current plan remains appropriate    Co-evaluation              AM-PAC PT "6 Clicks" Mobility   Outcome Measure  Help needed turning from your back to your side while in a flat bed without using bedrails?: A Little Help needed moving from lying on your back to sitting on the side of a flat bed without using bedrails?: A Lot Help needed moving to and from a bed to a chair (including a wheelchair)?: A Little Help needed standing up from a chair using your arms (e.g., wheelchair or bedside chair)?: A Little Help needed to walk in hospital room?: A Little Help needed climbing 3-5 steps with a railing? : Total 6 Click Score: 15    End of Session Equipment Utilized During Treatment: Gait  belt Activity Tolerance: Patient tolerated treatment well Patient left: in bed;with call bell/phone within reach;with bed alarm set Nurse Communication: Mobility status PT Visit Diagnosis: Unsteadiness on feet (R26.81);History of falling (Z91.81);Difficulty in walking, not elsewhere classified (R26.2)     Time: 1478-2956 PT Time Calculation (min) (ACUTE ONLY): 21 min  Charges:  $Therapeutic Activity: 8-22 mins                     Vale Haven, PT, DPT Acute Rehabilitation Services Pager [phone] Office [phone]       Blake Divine A Usha Slager 07/14/2019, 12:12 PM

## 2019-07-14 NOTE — Progress Notes (Signed)
PROGRESS NOTE    Nicholas Hancock  NFA:[id number] DOB: 26-May-1935 DOA: 07/12/2019 PCP: Adrian PrinceSouth, [name], MD   Brief Narrative:  Briefly 84 year old male with history of hypertension, aortic regurgitation, first-degree AV block, presents to the ED due to aphasia.  MRI brain showed small acute lacunar infarct in the left corona radiata with no associated hemorrhage or mass-effect, several additional small acute to subacute infarcts in the bilateral occipital lobes and right frontal lobe.  Stroke work-up in progress.  Echo done showed EF 35 to 40%, decreased from previous done about a month ago which was 55 to 60%.  EKG with PACs.  LDL 179.  A1c 5.9.  Will consult cardiology in a.m. (just seeing the echo report).  Continue telemetry monitoring.  Patient has been accepted to [facility] pending medical work-up completion.   Assessment & Plan:   Principal Problem:   Acute CVA (cerebrovascular accident) (HCC) Active Problems:   Hypertension   Vitamin D deficiency   Adrenal gland dysfunction (HCC)   Aortic valve insufficiency   1st degree AV block   Acute stroke: Continue aspirin We will not use dual therapy secondary concerns for CAA Echo showed reduced EF, discussed with cardiology saw in consultation, Patient had their input did not feel it was an acute change Continue aspirin, statin Discontinued Plavix.  Hypertension Continue current medications Spironolactone was prescribed for hypoaldosteronism Cards recommend low-dose beta-blocker Will hold ACE or ARB currently secondary to patient's history of hypotension Will need further evaluation as an outpatient  Aortic regurg: Seen by cards Will follow No acute intervention   DVT prophylaxis: SCD/Compression stockings  Code Status: full    Code Status Orders  (From admission, onward)         Start     Ordered   07/12/19 1925  Full code  Continuous     07/12/19 1927        Code Status History    Date Active Date Inactive Code  Status Order ID Comments User Context   07/15/2012 1225 07/15/2012 1708 Full Code [id number]  [name], [name], MD Inpatient   Advance Care Planning Activity    Advance Directive Documentation     Most Recent Value  Type of Advance Directive  Living will  Pre-existing out of facility DNR order (yellow form or pink MOST form)  --  "MOST" Form in Place?  --     Family Communication: none today  Disposition Plan:   Patient remained inpatient.  Continue with physical therapy post acute CVA.  Patient not stable or safe for discharge Consults called: Cardiology Admission status: Inpatient   Consultants:   Cardiology and neurology  Procedures:  Dg Chest 2 View  Result Date: 07/12/2019 CLINICAL DATA:  Altered mental status EXAM: CHEST - 2 VIEW COMPARISON:  None. FINDINGS: Heart is borderline in size. Lungs clear. No effusions. No acute bony abnormality. IMPRESSION: No active cardiopulmonary disease. Electronically Signed   By: [name]  Dover M.D.   On: 07/12/2019 19:52   Ct Head Wo Contrast  Result Date: 07/12/2019 CLINICAL DATA:  Patient with difficulty finding words. EXAM: CT HEAD WITHOUT CONTRAST TECHNIQUE: Contiguous axial images were obtained from the base of the skull through the vertex without intravenous contrast. COMPARISON:  MRI brain 01/17/2018 FINDINGS: Brain: Ventricles and sulci are prominent compatible with atrophy. Extensive periventricular and subcortical white matter hypodensities compatible with chronic microvascular ischemic changes. No evidence for acute cortically based infarct, intracranial hemorrhage, mass lesion or mass-effect. Bilateral chronic basal ganglia lacunar infarcts. Vascular:  Unremarkable Skull: Intact. Sinuses/Orbits: Polypoid mucosal thickening within the maxillary sinuses bilaterally. Mucosal thickening involving the ethmoid air cells. Mastoid air cells are unremarkable. Orbits are unremarkable. Other: None. IMPRESSION: No acute intracranial process. Atrophy  and chronic microvascular ischemic changes. Electronically Signed   By: [name] M.D.   On: 07/12/2019 18:07   Mr Angio Head Wo Contrast  Result Date: 07/13/2019 CLINICAL DATA:  84 year old male with abnormal speech since yesterday. Brain MRI positive for acute left corona radiata and several additional acute to subacute bilateral cerebral infarcts with numerous chronic microhemorrhages and intracranial artery ectasia. EXAM: MRA HEAD WITHOUT CONTRAST TECHNIQUE: Angiographic images of the Circle of Willis were obtained using MRA technique without intravenous contrast. COMPARISON:  Brain MRI today reported separately. FINDINGS: Antegrade flow in the posterior circulation with dominant right vertebral artery. The left vertebral artery functionally terminates in PICA. The right PICA origin is also patent. Mild distal vertebral irregularity without stenosis. Mild to moderate irregularity and stenosis of the basilar artery, most pronounced in the proximal basilar as seen on series 9, image 64 and series 1062, image 17. The basilar artery remains patent. Ectatic basilar tip with no discrete aneurysm. Fetal type left PCA origin. Right posterior communicating artery is diminutive or absent. Patent SCA origins. No significant right PCA stenosis. There is a severe left PCA P3 segment stenosis with preserved distal flow. Mild superimposed multifocal left P2 segment irregularity and stenosis. Antegrade flow in both ICA siphons. Tortuous distal cervical ICAs. Bilateral siphon irregularity with mild stenosis greater on the left. Normal ophthalmic and left posterior communicating artery origins. Dominant right and diminutive or absent left ACA A1 segments. Ectatic right ICA terminus. Normal MCA and right ACA origins. Anterior communicating artery and tortuous bilateral ACA branches are within normal limits. Right MCA M1 segment and bifurcation are tortuous without stenosis. There is mild right MCA M3 branch irregularity.  Left MCA M1 segment is tortuous and irregular with mild stenosis. The left MCA trifurcation is patent without stenosis. Visible left MCA branches are within normal limits. IMPRESSION: 1. 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 Stenoses are most pronounced in the Basilar Artery (Moderate) and Left PCA P3 segment (Severe). Mild stenosis elsewhere. No large vessel or intracranial branch occlusion identified. 2. Dominant right vertebral artery and right ACA A1 segment. Electronically Signed   By: Odessa Fleming M.D.   On: 07/13/2019 02:06   Mr Brain Wo Contrast  Result Date: 07/13/2019 CLINICAL DATA:  84 year old male with abnormal speech since yesterday. EXAM: MRI HEAD WITHOUT CONTRAST TECHNIQUE: Multiplanar, multiecho pulse sequences of the brain and surrounding structures were obtained without intravenous contrast. COMPARISON:  Plain head CT 07/12/2019. FINDINGS: Brain: Linear 10 millimeter area of restricted diffusion tracking from the left corona radiata to the posterior lentiform (series 7, image 55). There is also a small area of restricted diffusion in the left occipital lobe periventricular white matter on series 5, image 67. There is also a small linear area of contralateral right occipital lobe restricted diffusion on series 5, image 65 (and series 7, image 44). Furthermore there are one or two punctate, subtle areas of abnormal white matter diffusion in the right frontal lobe (such as on series 5, image 77) which are not definitely restricted on ADC. Superimposed chronic lacunar infarcts in the bilateral cerebral white matter, bilateral deep gray nuclei, and occasionally in the cerebellum. Superimposed numerous scattered chronic microhemorrhages in the brain, mostly sparing the deep gray nuclei. No definite chronic cortical encephalomalacia. No midline shift,  mass effect, evidence of mass lesion, ventriculomegaly, extra-axial collection or acute intracranial hemorrhage.  Cervicomedullary junction and pituitary are within normal limits. Vascular: Major intracranial vascular flow voids are preserved. There is generalized intracranial artery ectasia. The right vertebral artery appears dominant. See also intracranial MRA reported separately. Skull and upper cervical spine: Negative for age visible cervical spine. Visualized bone marrow signal is within normal limits. Sinuses/Orbits: Negative orbits. Multiple maxillary sinus mucous retention cysts. Other: Mastoids are well pneumatized. Visible internal auditory structures appear normal. Scalp and face soft tissues appear negative. IMPRESSION: 1. Small acute lacunar infarct in the left corona radiata with no associated hemorrhage or mass effect. But several additional small acute to subacute infarcts in the bilateral occipital lobes and right frontal lobe. Favor synchronous small vessel disease (see #2, #3) over a recent embolic event. 2. Underlying advanced chronic small vessel ischemic disease. 3. Superimposed numerous chronic micro-hemorrhages in the brain, Amyloid Angiopathy is possible. 4. Generalized intracranial artery ectasia. See also intracranial MRA reported separately. Electronically Signed   By: Odessa Fleming M.D.   On: 07/13/2019 01:59   Vas US Carotid  Result Date: 07/14/2019 Carotid Arterial Duplex Study Indications:       TIA. Risk Factors:      Hypertension. Comparison Study:  no prior Performing Technologist: [name] RVS  Examination Guidelines: A complete evaluation includes B-mode imaging, spectral Doppler, color Doppler, and power Doppler as needed of all accessible portions of each vessel. Bilateral testing is considered an integral part of a complete examination. Limited examinations for reoccurring indications may be performed as noted.  Right Carotid Findings: +----------+--------+--------+--------+------------------+--------+             PSV cm/s EDV cm/s Stenosis Plaque Description Comments   +----------+--------+--------+--------+------------------+--------+  CCA Prox   77       4                 heterogenous                 +----------+--------+--------+--------+------------------+--------+  CCA Distal 80       8                 heterogenous                 +----------+--------+--------+--------+------------------+--------+  ICA Prox   87       10       1-39%    heterogenous                 +----------+--------+--------+--------+------------------+--------+  ICA Distal 152      12                                             +----------+--------+--------+--------+------------------+--------+  ECA        174                                                     +----------+--------+--------+--------+------------------+--------+ +----------+--------+-------+--------+-------------------+             PSV cm/s EDV cms Describe Arm Pressure (mmHG)  +----------+--------+-------+--------+-------------------+  Subclavian 150                                            +----------+--------+-------+--------+-------------------+ +---------+--------+--+--------+-+---------+  Vertebral PSV cm/s 53 EDV cm/s 4 Antegrade  +---------+--------+--+--------+-+---------+  Left Carotid Findings: +----------+--------+--------+--------+------------------+--------+             PSV cm/s EDV cm/s Stenosis Plaque Description Comments  +----------+--------+--------+--------+------------------+--------+  CCA Prox   106                        heterogenous                 +----------+--------+--------+--------+------------------+--------+  CCA Distal 69                         heterogenous                 +----------+--------+--------+--------+------------------+--------+  ICA Prox   108      18       1-39%    heterogenous                 +----------+--------+--------+--------+------------------+--------+  ICA Distal 62       8                                              +----------+--------+--------+--------+------------------+--------+   ECA        138                                                     +----------+--------+--------+--------+------------------+--------+ +----------+--------+--------+--------+-------------------+             PSV cm/s EDV cm/s Describe Arm Pressure (mmHG)  +----------+--------+--------+--------+-------------------+  Subclavian 120                                             +----------+--------+--------+--------+-------------------+ +---------+--------+--+--------+---------+  Vertebral PSV cm/s 49 EDV cm/s Antegrade  +---------+--------+--+--------+---------+  Summary: Right Carotid: Velocities in the right ICA are consistent with a 1-39% stenosis. Left Carotid: Velocities in the left ICA are consistent with a 1-39% stenosis. Vertebrals: Bilateral vertebral arteries demonstrate antegrade flow. *See table(s) above for measurements and observations.  Electronically signed by Delia Heady MD on 07/14/2019 at 8:12:32 AM.    Final      Antimicrobials:   None   Subjective: No acute events overnight Resting comfortably in bed Appears moderately confused  Objective: Vitals:   07/14/19 0254 07/14/19 0533 07/14/19 0829 07/14/19 1305  BP: (!) 178/80 (!) 171/87 (!) 186/67 (!) 173/80  Pulse: 86 85 (!) 102 (!) 102  Resp: 18 18 18 18   Temp: 97.9 F (36.6 C) 97.7 F (36.5 C) 97.7 F (36.5 C) 98.7 F (37.1 C)  TempSrc: Oral Oral Oral Oral  SpO2: 98% 97% 96% 97%  Weight:      Height:        Intake/Output Summary (Last 24 hours) at 07/14/2019 1400 Last data filed at 07/14/2019 1300 Gross per 24 hour  Intake 900 ml  Output 1100 ml  Net -200 ml   Filed Weights   07/12/19 1647  Weight: 72.6 kg    Examination:  General exam: Appears calm and comfortable  Respiratory system: Clear to auscultation. Respiratory effort normal. Cardiovascular system:  Normal sinus rhythm, 2/6 systolic ejection murmur  gastrointestinal system: Abdomen is nondistended, soft and nontender. No organomegaly or masses felt.  Normal bowel sounds heard. Central nervous system: Alert and oriented. No focal neurological deficits. Extremities: Warm well perfused, neurovascular intact Skin: No rashes, lesions or ulcers Psychiatry: Judgement and insight.  Impaired with confusion     Data Reviewed: I have personally reviewed following labs and imaging studies  CBC: Recent Labs  Lab 07/12/19 1712 07/13/19 0500 07/14/19 0626  WBC 6.3 7.0 9.5  NEUTROABS 3.8  --  7.5  HGB 13.8 14.8 15.3  HCT 39.7 42.3 43.6  MCV 87.3 88.1 87.0  PLT 158 161 784   Basic Metabolic Panel: Recent Labs  Lab 07/12/19 1712 07/13/19 0500 07/14/19 0626  NA 139 141 139  K 3.5 3.3* 3.1*  CL 103 106 106  CO2 26 26 23   GLUCOSE 146* 115* 151*  BUN 19 16 22   CREATININE 1.20 1.17 1.04  CALCIUM 8.8* 8.9 8.8*   GFR: Estimated Creatinine Clearance: 53 mL/min (by C-G formula based on SCr of 1.04 mg/dL). Liver Function Tests: Recent Labs  Lab 07/12/19 1712 07/13/19 0500  AST 17 15  ALT 13 13  ALKPHOS 68 67  BILITOT 1.2 1.3*  PROT 6.1* 6.4*  ALBUMIN 3.5 3.6   No results for input(s): LIPASE, AMYLASE in the last 168 hours. No results for input(s): AMMONIA in the last 168 hours. Coagulation Profile: Recent Labs  Lab 07/12/19 1712  INR 1.1   Cardiac Enzymes: No results for input(s): CKTOTAL, CKMB, CKMBINDEX, TROPONINI in the last 168 hours. BNP (last 3 results) No results for input(s): PROBNP in the last 8760 hours. HbA1C: Recent Labs    07/13/19 0500  HGBA1C 5.9*   CBG: No results for input(s): GLUCAP in the last 168 hours. Lipid Profile: Recent Labs    07/13/19 0500  CHOL 226*  HDL 31*  LDLCALC 179*  TRIG 79  CHOLHDL 7.3   Thyroid Function Tests: No results for input(s): TSH, T4TOTAL, FREET4, T3FREE, THYROIDAB in the last 72 hours. Anemia Panel: No results for input(s): VITAMINB12, FOLATE, FERRITIN, TIBC, IRON, RETICCTPCT in the last 72 hours. Sepsis Labs: No results for input(s): PROCALCITON,  LATICACIDVEN in the last 168 hours.  Recent Results (from the past 240 hour(s))  SARS CORONAVIRUS 2 (TAT 6-24 HRS) Nasopharyngeal Nasopharyngeal Swab     Status: None   Collection Time: 07/12/19  7:19 PM   Specimen: Nasopharyngeal Swab  Result Value Ref Range Status   SARS Coronavirus 2 NEGATIVE NEGATIVE Final    Comment: (NOTE) SARS-CoV-2 target nucleic acids are NOT DETECTED. The SARS-CoV-2 RNA is generally detectable in upper and lower respiratory specimens during the acute phase of infection. Negative results do not preclude SARS-CoV-2 infection, do not rule out co-infections with other pathogens, and should not be used as the sole basis for treatment or other patient management decisions. Negative results must be combined with clinical observations, patient history, and epidemiological information. The expected result is Negative. Fact Sheet for Patients: SugarRoll.be Fact Sheet for Healthcare Providers: https://www.woods-mathews.com/ This test is not yet approved or cleared by the [place] FDA and  has been authorized for detection and/or diagnosis of SARS-CoV-2 by FDA under an Emergency Use Authorization (EUA). This EUA will remain  in effect (meaning this test can be used) for the duration of the COVID-19 declaration under Section 56 4(b)(1) of the Act, 21 U.S.C. section 360bbb-3(b)(1), unless the authorization is terminated or revoked sooner. Performed at [facility]  [facility], [address], [place], [place] [address]          Radiology Studies: Dg Chest 2 View  Result Date: 07/12/2019 CLINICAL DATA:  Altered mental status EXAM: CHEST - 2 VIEW COMPARISON:  None. FINDINGS: Heart is borderline in size. Lungs clear. No effusions. No acute bony abnormality. IMPRESSION: No active cardiopulmonary disease. Electronically Signed   By: Charlett Nose M.D.   On: 07/12/2019 19:52   Ct Head Wo Contrast  Result Date:  07/12/2019 CLINICAL DATA:  Patient with difficulty finding words. EXAM: CT HEAD WITHOUT CONTRAST TECHNIQUE: Contiguous axial images were obtained from the base of the skull through the vertex without intravenous contrast. COMPARISON:  MRI brain 01/17/2018 FINDINGS: Brain: Ventricles and sulci are prominent compatible with atrophy. Extensive periventricular and subcortical white matter hypodensities compatible with chronic microvascular ischemic changes. No evidence for acute cortically based infarct, intracranial hemorrhage, mass lesion or mass-effect. Bilateral chronic basal ganglia lacunar infarcts. Vascular: Unremarkable Skull: Intact. Sinuses/Orbits: Polypoid mucosal thickening within the maxillary sinuses bilaterally. Mucosal thickening involving the ethmoid air cells. Mastoid air cells are unremarkable. Orbits are unremarkable. Other: None. IMPRESSION: No acute intracranial process. Atrophy and chronic microvascular ischemic changes. Electronically Signed   By: [name] M.D.   On: 07/12/2019 18:07   Mr Angio Head Wo Contrast  Result Date: 07/13/2019 CLINICAL DATA:  84 year old male with abnormal speech since yesterday. Brain MRI positive for acute left corona radiata and several additional acute to subacute bilateral cerebral infarcts with numerous chronic microhemorrhages and intracranial artery ectasia. EXAM: MRA HEAD WITHOUT CONTRAST TECHNIQUE: Angiographic images of the Circle of Willis were obtained using MRA technique without intravenous contrast. COMPARISON:  Brain MRI today reported separately. FINDINGS: Antegrade flow in the posterior circulation with dominant right vertebral artery. The left vertebral artery functionally terminates in PICA. The right PICA origin is also patent. Mild distal vertebral irregularity without stenosis. Mild to moderate irregularity and stenosis of the basilar artery, most pronounced in the proximal basilar as seen on series 9, image 64 and series 1062, image 17.  The basilar artery remains patent. Ectatic basilar tip with no discrete aneurysm. Fetal type left PCA origin. Right posterior communicating artery is diminutive or absent. Patent SCA origins. No significant right PCA stenosis. There is a severe left PCA P3 segment stenosis with preserved distal flow. Mild superimposed multifocal left P2 segment irregularity and stenosis. Antegrade flow in both ICA siphons. Tortuous distal cervical ICAs. Bilateral siphon irregularity with mild stenosis greater on the left. Normal ophthalmic and left posterior communicating artery origins. Dominant right and diminutive or absent left ACA A1 segments. Ectatic right ICA terminus. Normal MCA and right ACA origins. Anterior communicating artery and tortuous bilateral ACA branches are within normal limits. Right MCA M1 segment and bifurcation are tortuous without stenosis. There is mild right MCA M3 branch irregularity. Left MCA M1 segment is tortuous and irregular with mild stenosis. The left MCA trifurcation is patent without stenosis. Visible left MCA branches are within normal limits. IMPRESSION: 1. 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 Stenoses are most pronounced in the Basilar Artery (Moderate) and Left PCA P3 segment (Severe). Mild stenosis elsewhere. No large vessel or intracranial branch occlusion identified. 2. Dominant right vertebral artery and right ACA A1 segment. Electronically Signed   By: Odessa Fleming M.D.   On: 07/13/2019 02:06   Mr Brain Wo Contrast  Result Date: 07/13/2019 CLINICAL DATA:  84 year old male with abnormal speech since yesterday. EXAM: MRI HEAD WITHOUT CONTRAST  TECHNIQUE: Multiplanar, multiecho pulse sequences of the brain and surrounding structures were obtained without intravenous contrast. COMPARISON:  Plain head CT 07/12/2019. FINDINGS: Brain: Linear 10 millimeter area of restricted diffusion tracking from the left corona radiata to the posterior lentiform (series 7,  image 55). There is also a small area of restricted diffusion in the left occipital lobe periventricular white matter on series 5, image 67. There is also a small linear area of contralateral right occipital lobe restricted diffusion on series 5, image 65 (and series 7, image 44). Furthermore there are one or two punctate, subtle areas of abnormal white matter diffusion in the right frontal lobe (such as on series 5, image 77) which are not definitely restricted on ADC. Superimposed chronic lacunar infarcts in the bilateral cerebral white matter, bilateral deep gray nuclei, and occasionally in the cerebellum. Superimposed numerous scattered chronic microhemorrhages in the brain, mostly sparing the deep gray nuclei. No definite chronic cortical encephalomalacia. No midline shift, mass effect, evidence of mass lesion, ventriculomegaly, extra-axial collection or acute intracranial hemorrhage. Cervicomedullary junction and pituitary are within normal limits. Vascular: Major intracranial vascular flow voids are preserved. There is generalized intracranial artery ectasia. The right vertebral artery appears dominant. See also intracranial MRA reported separately. Skull and upper cervical spine: Negative for age visible cervical spine. Visualized bone marrow signal is within normal limits. Sinuses/Orbits: Negative orbits. Multiple maxillary sinus mucous retention cysts. Other: Mastoids are well pneumatized. Visible internal auditory structures appear normal. Scalp and face soft tissues appear negative. IMPRESSION: 1. Small acute lacunar infarct in the left corona radiata with no associated hemorrhage or mass effect. But several additional small acute to subacute infarcts in the bilateral occipital lobes and right frontal lobe. Favor synchronous small vessel disease (see #2, #3) over a recent embolic event. 2. Underlying advanced chronic small vessel ischemic disease. 3. Superimposed numerous chronic micro-hemorrhages in the  brain, Amyloid Angiopathy is possible. 4. Generalized intracranial artery ectasia. See also intracranial MRA reported separately. Electronically Signed   By: Odessa Fleming M.D.   On: 07/13/2019 01:59   Vas US Carotid  Result Date: 07/14/2019 Carotid Arterial Duplex Study Indications:       TIA. Risk Factors:      Hypertension. Comparison Study:  no prior Performing Technologist: [name] RVS  Examination Guidelines: A complete evaluation includes B-mode imaging, spectral Doppler, color Doppler, and power Doppler as needed of all accessible portions of each vessel. Bilateral testing is considered an integral part of a complete examination. Limited examinations for reoccurring indications may be performed as noted.  Right Carotid Findings: +----------+--------+--------+--------+------------------+--------+             PSV cm/s EDV cm/s Stenosis Plaque Description Comments  +----------+--------+--------+--------+------------------+--------+  CCA Prox   77       4                 heterogenous                 +----------+--------+--------+--------+------------------+--------+  CCA Distal 80       8                 heterogenous                 +----------+--------+--------+--------+------------------+--------+  ICA Prox   87       10       1-39%    heterogenous                 +----------+--------+--------+--------+------------------+--------+  ICA  Distal 152      12                                             +----------+--------+--------+--------+------------------+--------+  ECA        174                                                     +----------+--------+--------+--------+------------------+--------+ +----------+--------+-------+--------+-------------------+             PSV cm/s EDV cms Describe Arm Pressure (mmHG)  +----------+--------+-------+--------+-------------------+  Subclavian 150                                            +----------+--------+-------+--------+-------------------+  +---------+--------+--+--------+-+---------+  Vertebral PSV cm/s 53 EDV cm/s 4 Antegrade  +---------+--------+--+--------+-+---------+  Left Carotid Findings: +----------+--------+--------+--------+------------------+--------+             PSV cm/s EDV cm/s Stenosis Plaque Description Comments  +----------+--------+--------+--------+------------------+--------+  CCA Prox   106                        heterogenous                 +----------+--------+--------+--------+------------------+--------+  CCA Distal 69                         heterogenous                 +----------+--------+--------+--------+------------------+--------+  ICA Prox   108      18       1-39%    heterogenous                 +----------+--------+--------+--------+------------------+--------+  ICA Distal 62       8                                              +----------+--------+--------+--------+------------------+--------+  ECA        138                                                     +----------+--------+--------+--------+------------------+--------+ +----------+--------+--------+--------+-------------------+             PSV cm/s EDV cm/s Describe Arm Pressure (mmHG)  +----------+--------+--------+--------+-------------------+  Subclavian 120                                             +----------+--------+--------+--------+-------------------+ +---------+--------+--+--------+---------+  Vertebral PSV cm/s 49 EDV cm/s Antegrade  +---------+--------+--+--------+---------+  Summary: Right Carotid: Velocities in the right ICA are consistent with a 1-39% stenosis. Left Carotid: Velocities in the left ICA are consistent with a 1-39% stenosis. Vertebrals: Bilateral vertebral arteries demonstrate antegrade flow. *See table(s) above for  measurements and observations.  Electronically signed by Delia HeadyPramod Sethi MD on 07/14/2019 at 8:12:32 AM.    Final         Scheduled Meds:   stroke: mapping our early stages of recovery book   Does not apply  Once   aspirin EC  81 mg Oral Daily   atorvastatin  80 mg Oral Daily   cholecalciferol  1,000 Units Oral QODAY   clopidogrel  75 mg Oral Daily   [START ON 07/15/2019] metoprolol succinate  50 mg Oral Daily   spironolactone  50 mg Oral Q0600   vitamin B-12  1,000 mcg Oral Daily   Continuous Infusions:   LOS: 1 day    Time spent: 35 min    Burke Keelshristopher Denean Pavon, MD [facility]  If 7PM-7AM, please contact night-coverage  07/14/2019, 2:00 PM

## 2019-07-14 NOTE — Progress Notes (Signed)
Inpatient Rehabilitation-Admissions Coordinator    I met with pt at the bedside for rehabilitation assessment and to discuss goals and expectations of an inpatient rehab admission. Pt and his wife are in favor of CIR at this time. Pt was independent and active PTA and is motivated to return to independence. I will begin insurance authorization for possible admit.   Jhonnie Garner, OTR/L  Rehab Admissions Coordinator  [phone] 07/14/2019 11:59 AM

## 2019-07-14 NOTE — Progress Notes (Signed)
MD notified regarding pts BP of 185/91. New order for apresoline 5 mg. Med given per order. Will cont to monitor.

## 2019-07-15 DIAGNOSIS — I5022 Chronic systolic (congestive) heart failure: Secondary | ICD-10-CM | POA: Diagnosis present

## 2019-07-15 DIAGNOSIS — I471 Supraventricular tachycardia: Secondary | ICD-10-CM | POA: Diagnosis present

## 2019-07-15 LAB — CBC WITH DIFFERENTIAL/PLATELET
Abs Immature Granulocytes: 0.03 10*3/uL (ref 0.00–0.07)
Basophils Absolute: 0 10*3/uL (ref 0.0–0.1)
Basophils Relative: 0 %
Eosinophils Absolute: 0.4 10*3/uL (ref 0.0–0.5)
Eosinophils Relative: 5 %
HCT: 44.4 % (ref 39.0–52.0)
Hemoglobin: 15.5 g/dL (ref 13.0–17.0)
Immature Granulocytes: 0 %
Lymphocytes Relative: 14 %
Lymphs Abs: 1.2 10*3/uL (ref 0.7–4.0)
MCH: 30.4 pg (ref 26.0–34.0)
MCHC: 34.9 g/dL (ref 30.0–36.0)
MCV: 87.1 fL (ref 80.0–100.0)
Monocytes Absolute: 0.9 10*3/uL (ref 0.1–1.0)
Monocytes Relative: 11 %
Neutro Abs: 6.1 10*3/uL (ref 1.7–7.7)
Neutrophils Relative %: 70 %
Platelets: 174 10*3/uL (ref 150–400)
RBC: 5.1 MIL/uL (ref 4.22–5.81)
RDW: 13.1 % (ref 11.5–15.5)
WBC: 8.7 10*3/uL (ref 4.0–10.5)
nRBC: 0 % (ref 0.0–0.2)

## 2019-07-15 LAB — BASIC METABOLIC PANEL
Anion gap: 14 (ref 5–15)
BUN: 27 mg/dL — ABNORMAL HIGH (ref 8–23)
CO2: 23 mmol/L (ref 22–32)
Calcium: 9 mg/dL (ref 8.9–10.3)
Chloride: 104 mmol/L (ref 98–111)
Creatinine, Ser: 1.21 mg/dL (ref 0.61–1.24)
GFR calc Af Amer: >60 mL/min (ref >60)
GFR calc non Af Amer: 55 mL/min — ABNORMAL LOW (ref >60)
Glucose, Bld: 135 mg/dL — ABNORMAL HIGH (ref 70–99)
Potassium: 3.5 mmol/L (ref 3.5–5.1)
Sodium: 141 mmol/L (ref 135–145)

## 2019-07-15 MED ORDER — LOSARTAN POTASSIUM 25 MG PO TABS
25.0000 mg | ORAL_TABLET | Freq: Every day | ORAL | Status: DC
  Administered 2019-07-15 – 2019-07-17 (×3): 25 mg via ORAL
  Filled 2019-07-15 (×3): qty 1

## 2019-07-15 MED ORDER — SPIRONOLACTONE 25 MG PO TABS
25.0000 mg | ORAL_TABLET | Freq: Every day | ORAL | Status: DC
  Administered 2019-07-16 – 2019-07-17 (×2): 25 mg via ORAL
  Filled 2019-07-15 (×2): qty 1

## 2019-07-15 NOTE — Progress Notes (Signed)
  Speech Language Pathology Treatment: Cognitive-Linquistic  Patient Details Name: Nicholas Hancock MRN: [id number] DOB: 1934-11-17 Today's Date: 07/15/2019 Time: 0737-1062 SLP Time Calculation (min) (ACUTE ONLY): 27 min  Assessment / Plan / Recommendation Clinical Impression  Pt seen at bedside for skilled ST intervention targeting goals for improved memory, problem solving, intelligibility of speech and following directions. Pt was pleasant and cooperative with unfamiliar therapist. Decreased recall of rationale for hospitalization. Pt required cues to verbalize how he would notify staff if he had needs. His initial response was that he would "call out, because they are always walking around out there". Pt had difficulty locating his call bell, which was on his bed next to his phone. Pt initially identified the red button on his phone as the call button, but was receptive to education regarding effective use of the call bell.  Speech was initially intelligible, however, as the session progressed, requests for repetition increased. Pt would begin a sentence with intelligible speech, but the sentence would end with moderately dysarthric unintelligible utterances requiring repetition. Pt was educated regarding strategies to improve intelligibility, including slow rate and over-articulation. Pt would benefit from continued education in these areas. Continued ST intervention is recommended per plan of care.   HPI HPI:  84 y.o. male with past medical history significant for hypertension, adrenal gland dysfunction, prior history of TIA, former smoker presents to the emergency department with difficulty with language that began yesterday evening and persisted today morning.  Patient denies any other symptoms such as weakness or numbness, slurred speech, vision changes or difficulty walking.  MRI 11/30: "Small acute lacunar infarct in the left corona radiata with no associated hemorrhage or mass effect. But  several additional small acute to subacute infarcts in the bilateral occipital lobes and right frontal lobe"  CXR 11/29: clear      SLP Plan  Continue with current plan of care       Recommendations  Diet recommendations: Regular;Thin liquid Liquids provided via: Straw Medication Administration: Whole meds with liquid Supervision: Patient able to self feed Compensations: Slow rate;Small sips/bites Postural Changes and/or Swallow Maneuvers: Seated upright 90 degrees                Oral Care Recommendations: Oral care BID Follow up Recommendations: Inpatient Rehab SLP Visit Diagnosis: Dysphagia, unspecified (R13.10) Plan: Continue with current plan of care       [place], [facility], [place] Pathologist Office: [phone] Pager: [phone]  Shonna Chock 07/15/2019, 1:17 PM

## 2019-07-15 NOTE — Progress Notes (Signed)
Progress Note  Patient Name: Nicholas Hancock Date of Encounter: 07/15/2019  Primary Cardiologist: Candee Furbish, MD   Subjective   Denies any CP or SOB  Inpatient Medications    Scheduled Meds:   stroke: mapping our early stages of recovery book   Does not apply Once   aspirin EC  81 mg Oral Daily   atorvastatin  80 mg Oral Daily   cholecalciferol  1,000 Units Oral QODAY   metoprolol succinate  50 mg Oral Daily   spironolactone  50 mg Oral Q0600   vitamin B-12  1,000 mcg Oral Daily   Continuous Infusions:  PRN Meds: acetaminophen **OR** acetaminophen (TYLENOL) oral liquid 160 mg/5 mL **OR** acetaminophen, hydrALAZINE, LORazepam   Vital Signs    Vitals:   07/14/19 1859 07/14/19 2028 07/14/19 2320 07/15/19 0526  BP: (!) 157/81 (!) 193/66 (!) 167/78 (!) 179/75  Pulse: 94 62 91 91  Resp: 18 (!) 21 16 17   Temp: 97.9 F (36.6 C) 98.5 F (36.9 C) 97.6 F (36.4 C) 97.6 F (36.4 C)  TempSrc: Oral Oral Oral Oral  SpO2: 96% 94% 95% 97%  Weight:      Height:        Intake/Output Summary (Last 24 hours) at 07/15/2019 0756 Last data filed at 07/15/2019 0300 Gross per 24 hour  Intake 1200 ml  Output --  Net 1200 ml   Last 3 Weights 07/12/2019 10/27/2018 04/28/2018  Weight (lbs) 160 lb 159 lb 160 lb  Weight (kg) 72.576 kg 72.122 kg 72.576 kg      Telemetry    Sinus rhythm with bursts of AVNRT vs multifocal atrial tachycardia - Personally Reviewed  ECG    Sinus rhythm with PACs - Personally Reviewed  Physical Exam   GEN: No acute distress.   Neck: No JVD Cardiac: RRR, no murmurs, rubs, or gallops.  Respiratory: Clear to auscultation bilaterally. GI: Soft, nontender, non-distended  MS: No edema; No deformity. Neuro:  Nonfocal  Psych: Normal affect   Labs    High Sensitivity Troponin:  No results for input(s): TROPONINIHS in the last 720 hours.    Chemistry Recent Labs  Lab 07/12/19 1712 07/13/19 0500 07/14/19 0626 07/15/19 0541  NA 139 141 139  141  K 3.5 3.3* 3.1* 3.5  CL 103 106 106 104  CO2 26 26 23 23   GLUCOSE 146* 115* 151* 135*  BUN 19 16 22  27*  CREATININE 1.20 1.17 1.04 1.21  CALCIUM 8.8* 8.9 8.8* 9.0  PROT 6.1* 6.4*  --   --   ALBUMIN 3.5 3.6  --   --   AST 17 15  --   --   ALT 13 13  --   --   ALKPHOS 68 67  --   --   BILITOT 1.2 1.3*  --   --   GFRNONAA 56* 58* >60 55*  GFRAA >60 >60 >60 >60  ANIONGAP 10 9 10 14      Hematology Recent Labs  Lab 07/13/19 0500 07/14/19 0626 07/15/19 0541  WBC 7.0 9.5 8.7  RBC 4.80 5.01 5.10  HGB 14.8 15.3 15.5  HCT 42.3 43.6 44.4  MCV 88.1 87.0 87.1  MCH 30.8 30.5 30.4  MCHC 35.0 35.1 34.9  RDW 12.7 12.9 13.1  PLT 161 195 174    BNPNo results for input(s): BNP, PROBNP in the last 168 hours.   DDimer No results for input(s): DDIMER in the last 168 hours.   Radiology    Vas  US Carotid  Result Date: 07/14/2019 Carotid Arterial Duplex Study Indications:       TIA. Risk Factors:      Hypertension. Comparison Study:  no prior Performing Technologist: [name] RVS  Examination Guidelines: A complete evaluation includes B-mode imaging, spectral Doppler, color Doppler, and power Doppler as needed of all accessible portions of each vessel. Bilateral testing is considered an integral part of a complete examination. Limited examinations for reoccurring indications may be performed as noted.  Right Carotid Findings: +----------+--------+--------+--------+------------------+--------+             PSV cm/s EDV cm/s Stenosis Plaque Description Comments  +----------+--------+--------+--------+------------------+--------+  CCA Prox   77       4                 heterogenous                 +----------+--------+--------+--------+------------------+--------+  CCA Distal 80       8                 heterogenous                 +----------+--------+--------+--------+------------------+--------+  ICA Prox   87       10       1-39%    heterogenous                  +----------+--------+--------+--------+------------------+--------+  ICA Distal 152      12                                             +----------+--------+--------+--------+------------------+--------+  ECA        174                                                     +----------+--------+--------+--------+------------------+--------+ +----------+--------+-------+--------+-------------------+             PSV cm/s EDV cms Describe Arm Pressure (mmHG)  +----------+--------+-------+--------+-------------------+  Subclavian 150                                            +----------+--------+-------+--------+-------------------+ +---------+--------+--+--------+-+---------+  Vertebral PSV cm/s 53 EDV cm/s 4 Antegrade  +---------+--------+--+--------+-+---------+  Left Carotid Findings: +----------+--------+--------+--------+------------------+--------+             PSV cm/s EDV cm/s Stenosis Plaque Description Comments  +----------+--------+--------+--------+------------------+--------+  CCA Prox   106                        heterogenous                 +----------+--------+--------+--------+------------------+--------+  CCA Distal 69                         heterogenous                 +----------+--------+--------+--------+------------------+--------+  ICA Prox   108      18       1-39%    heterogenous                 +----------+--------+--------+--------+------------------+--------+  ICA Distal 62       8                                              +----------+--------+--------+--------+------------------+--------+  ECA        138                                                     +----------+--------+--------+--------+------------------+--------+ +----------+--------+--------+--------+-------------------+             PSV cm/s EDV cm/s Describe Arm Pressure (mmHG)  +----------+--------+--------+--------+-------------------+  Subclavian 120                                              +----------+--------+--------+--------+-------------------+ +---------+--------+--+--------+---------+  Vertebral PSV cm/s 49 EDV cm/s Antegrade  +---------+--------+--+--------+---------+  Summary: Right Carotid: Velocities in the right ICA are consistent with a 1-39% stenosis. Left Carotid: Velocities in the left ICA are consistent with a 1-39% stenosis. Vertebrals: Bilateral vertebral arteries demonstrate antegrade flow. *See table(s) above for measurements and observations.  Electronically signed by Delia Heady MD on 07/14/2019 at 8:12:32 AM.    Final     Cardiac Studies   Echo 05/25/2019 IMPRESSIONS  1. Left ventricular ejection fraction, by visual estimation, is 55 to 60%. The left ventricle has normal function. Mildly increased left ventricular size. There is no left ventricular hypertrophy.  2. Left ventricular diastolic Doppler parameters are consistent with impaired relaxation pattern of LV diastolic filling.  3. False tendon in LV apex of no clinical significance.  4. Global right ventricle has normal systolic function.The right ventricular size is normal. No increase in right ventricular wall thickness.  5. Left atrial size was severely dilated.  6. Right atrial size was normal.  7. The mitral valve is normal in structure. Trace mitral valve regurgitation. No evidence of mitral stenosis.  8. The tricuspid valve is normal in structure. Tricuspid valve regurgitation is trivial.  9. The aortic valve is tricuspid Aortic valve regurgitation is moderate by color flow Doppler. Mild to moderate aortic valve sclerosis/calcification without any evidence of aortic stenosis. 10. The pulmonic valve was normal in structure. Pulmonic valve regurgitation is mild by color flow Doppler. 11. Mildly elevated pulmonary artery systolic pressure. 12. The inferior vena cava is normal in size with greater than 50% respiratory variability, suggesting right atrial pressure of 3 mmHg.   Limited echo  07/13/2019 IMPRESSIONS  1. Left ventricular ejection fraction, by visual estimation, is 35 to 40%. The left ventricle has moderately decreased function. Mildly increased left ventricular posterior wall thickness. There is no left ventricular hypertrophy.  2. Mildly dilated left ventricular internal cavity size.  3. Compared to report from Oct 2020, LVEF is depressed.  4. Global right ventricle has normal systolic function.The right ventricular size is normal. No increase in right ventricular wall thickness.  5. Left atrial size was moderately dilated.  6. Right atrial size was normal.  7. Trivial pericardial effusion is present.  8. The mitral valve is grossly normal. Mild mitral valve regurgitation.  9. The tricuspid valve is normal in structure. Tricuspid valve regurgitation mild-moderate. 10.  The aortic valve is tricuspid. Aortic valve regurgitation is mild. Mild aortic valve sclerosis without stenosis. 11. The pulmonic valve was grossly normal. Pulmonic valve regurgitation is trivial. 12. Normal pulmonary artery systolic pressure. 13. The interatrial septum was not assessed.  Patient Profile     84 y.o. male with PMH of HTN and AI presented with aphasia. 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 Echo showed low EF of 35-40% when compare to echo from one month ago.  Assessment & Plan   1. New LV dysfunction:  - Echo 07/13/2019 EF 35-40%, mild AI. EF initially appears to be dropped when compare to previous echo on 05/25/2019 which showed moderate AI and EF 55-60%. Both echo were reviewed by Dr. Cristal Deerhristopher who felt previous EF on echo in Oct actually was low normal. There is only mild drop in EF  - given lack of anginal symptom and recent stroke, it was recommended to manage medically using CHF medications, then reassess EF at a later date.   - will reduce spironolactone to 25mg  daily and add losartan 25mg  daily. Avoid hypotension with  recent stroke.   2. Atrial tachycardia: frequent small bursts on telemetry, AVNRT vs AVRT vs multifocal atrial tachycardia  - started on low dose BB, continue to increase BB on followup for LV dysfunction and persistent bursts of tachycardia  3. Aortic insufficiency: mild by echo   4. 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 HTN: allow permissive hypertension  6. HLD: lipitor increased to 80mg  during admission        For questions or updates, please contact [facility] Please consult [email or web address] for contact info under        Signed, Azalee CourseHao Jadore Veals, PA  07/15/2019, 7:56 AM

## 2019-07-15 NOTE — Progress Notes (Addendum)
PROGRESS NOTE    Nicholas Hancock  YKD:[id number] DOB: May 24, 1935 DOA: 07/12/2019 PCP: Adrian Prince, MD   Brief Narrative:  Briefly 84 year old male with history of hypertension, aortic regurgitation, first-degree AV block, presents to the ED due to aphasia. MRI brain showed small acute lacunar infarct in the left corona radiata with no associated hemorrhage or mass-effect, several additional small acute to subacute infarcts in the bilateral occipital lobes and right frontal lobe. Stroke work-up in progress. Echo done showed EF 35 to 40%, decreased from previous done about a month ago which was 55 to 60%. EKG with PACs. LDL 179. A1c 5.9. Will consult cardiology in a.m. (just seeing the echo report). Continue telemetry monitoring. Patient has been accepted to [facility] pending medical work-up completion.   Assessment & Plan:   Principal Problem:   Acute CVA (cerebrovascular accident) (HCC) Active Problems:   Hypertension   Vitamin D deficiency   Adrenal gland dysfunction (HCC)   Aortic valve insufficiency   1st degree AV block   Acute stroke: Continue aspirin We will not use dual therapy secondary concerns for CAA Echo showed reduced EF, discussed with cardiology saw in consultation, Patient had their input did not feel it was an acute change Continue aspirin, statin Discontinued Plavix.per neuro  Hypertension Continue current medications Spironolactone was prescribed for hypoaldosteronism dose reduce as below Cards recommend low-dose beta-blocker ARB added back in by cards, losartan 25 mg p.o. daily Will need further evaluation as an outpatient  Atrial tachycardia: Continue with beta-blocker titrate as clinically indicated  Hyperlipidemia continue statin  Aortic regurg with new LV dysfunction: Seen by cards did not feel it was an acute change, Did decrease spiro to 25 daily and added losartan 25 mg p.o. daily. Will follow No acute intervention  DVT prophylaxis:  SCD/Compression stockings  Code Status: full    Code Status Orders  (From admission, onward)         Start     Ordered   07/12/19 1925  Full code  Continuous     07/12/19 1927        Code Status History    Date Active Date Inactive Code Status Order ID Comments User Context   07/15/2012 1225 07/15/2012 1708 Full Code [id number]  Valarie Merino, MD Inpatient   Advance Care Planning Activity    Advance Directive Documentation     Most Recent Value  Type of Advance Directive  Living will  Pre-existing out of facility DNR order (yellow form or pink MOST form)  --  "MOST" Form in Place?  --     Family Communication: tried calling wife NA Disposition Plan:   Patient remained inpatient for continued physical therapy post acute CVA, as well as management of irregular heart rhythm and atrial tachycardia.  Patient not stable or safe for discharge at Consults called: None Admission status: Inpatient   Consultants:   Cardiology, neurology  Procedures:  Dg Chest 2 View  Result Date: 07/12/2019 CLINICAL DATA:  Altered mental status EXAM: CHEST - 2 VIEW COMPARISON:  None. FINDINGS: Heart is borderline in size. Lungs clear. No effusions. No acute bony abnormality. IMPRESSION: No active cardiopulmonary disease. Electronically Signed   By: Charlett Nose M.D.   On: 07/12/2019 19:52   Ct Head Wo Contrast  Result Date: 07/12/2019 CLINICAL DATA:  Patient with difficulty finding words. EXAM: CT HEAD WITHOUT CONTRAST TECHNIQUE: Contiguous axial images were obtained from the base of the skull through the vertex without intravenous contrast. COMPARISON:  MRI brain  01/17/2018 FINDINGS: Brain: Ventricles and sulci are prominent compatible with atrophy. Extensive periventricular and subcortical white matter hypodensities compatible with chronic microvascular ischemic changes. No evidence for acute cortically based infarct, intracranial hemorrhage, mass lesion or mass-effect. Bilateral chronic basal  ganglia lacunar infarcts. Vascular: Unremarkable Skull: Intact. Sinuses/Orbits: Polypoid mucosal thickening within the maxillary sinuses bilaterally. Mucosal thickening involving the ethmoid air cells. Mastoid air cells are unremarkable. Orbits are unremarkable. Other: None. IMPRESSION: No acute intracranial process. Atrophy and chronic microvascular ischemic changes. Electronically Signed   By: [name] M.D.   On: 07/12/2019 18:07   Mr Angio Head Wo Contrast  Result Date: 07/13/2019 CLINICAL DATA:  84 year old male with abnormal speech since yesterday. Brain MRI positive for acute left corona radiata and several additional acute to subacute bilateral cerebral infarcts with numerous chronic microhemorrhages and intracranial artery ectasia. EXAM: MRA HEAD WITHOUT CONTRAST TECHNIQUE: Angiographic images of the Circle of Willis were obtained using MRA technique without intravenous contrast. COMPARISON:  Brain MRI today reported separately. FINDINGS: Antegrade flow in the posterior circulation with dominant right vertebral artery. The left vertebral artery functionally terminates in PICA. The right PICA origin is also patent. Mild distal vertebral irregularity without stenosis. Mild to moderate irregularity and stenosis of the basilar artery, most pronounced in the proximal basilar as seen on series 9, image 64 and series 1062, image 17. The basilar artery remains patent. Ectatic basilar tip with no discrete aneurysm. Fetal type left PCA origin. Right posterior communicating artery is diminutive or absent. Patent SCA origins. No significant right PCA stenosis. There is a severe left PCA P3 segment stenosis with preserved distal flow. Mild superimposed multifocal left P2 segment irregularity and stenosis. Antegrade flow in both ICA siphons. Tortuous distal cervical ICAs. Bilateral siphon irregularity with mild stenosis greater on the left. Normal ophthalmic and left posterior communicating artery origins.  Dominant right and diminutive or absent left ACA A1 segments. Ectatic right ICA terminus. Normal MCA and right ACA origins. Anterior communicating artery and tortuous bilateral ACA branches are within normal limits. Right MCA M1 segment and bifurcation are tortuous without stenosis. There is mild right MCA M3 branch irregularity. Left MCA M1 segment is tortuous and irregular with mild stenosis. The left MCA trifurcation is patent without stenosis. Visible left MCA branches are within normal limits. IMPRESSION: 1. 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 Stenoses are most pronounced in the Basilar Artery (Moderate) and Left PCA P3 segment (Severe). Mild stenosis elsewhere. No large vessel or intracranial branch occlusion identified. 2. Dominant right vertebral artery and right ACA A1 segment. Electronically Signed   By: Odessa Fleming M.D.   On: 07/13/2019 02:06   Mr Brain Wo Contrast  Result Date: 07/13/2019 CLINICAL DATA:  84 year old male with abnormal speech since yesterday. EXAM: MRI HEAD WITHOUT CONTRAST TECHNIQUE: Multiplanar, multiecho pulse sequences of the brain and surrounding structures were obtained without intravenous contrast. COMPARISON:  Plain head CT 07/12/2019. FINDINGS: Brain: Linear 10 millimeter area of restricted diffusion tracking from the left corona radiata to the posterior lentiform (series 7, image 55). There is also a small area of restricted diffusion in the left occipital lobe periventricular white matter on series 5, image 67. There is also a small linear area of contralateral right occipital lobe restricted diffusion on series 5, image 65 (and series 7, image 44). Furthermore there are one or two punctate, subtle areas of abnormal white matter diffusion in the right frontal lobe (such as on series 5, image 77) which are not definitely  restricted on ADC. Superimposed chronic lacunar infarcts in the bilateral cerebral white matter, bilateral deep gray  nuclei, and occasionally in the cerebellum. Superimposed numerous scattered chronic microhemorrhages in the brain, mostly sparing the deep gray nuclei. No definite chronic cortical encephalomalacia. No midline shift, mass effect, evidence of mass lesion, ventriculomegaly, extra-axial collection or acute intracranial hemorrhage. Cervicomedullary junction and pituitary are within normal limits. Vascular: Major intracranial vascular flow voids are preserved. There is generalized intracranial artery ectasia. The right vertebral artery appears dominant. See also intracranial MRA reported separately. Skull and upper cervical spine: Negative for age visible cervical spine. Visualized bone marrow signal is within normal limits. Sinuses/Orbits: Negative orbits. Multiple maxillary sinus mucous retention cysts. Other: Mastoids are well pneumatized. Visible internal auditory structures appear normal. Scalp and face soft tissues appear negative. IMPRESSION: 1. Small acute lacunar infarct in the left corona radiata with no associated hemorrhage or mass effect. But several additional small acute to subacute infarcts in the bilateral occipital lobes and right frontal lobe. Favor synchronous small vessel disease (see #2, #3) over a recent embolic event. 2. Underlying advanced chronic small vessel ischemic disease. 3. Superimposed numerous chronic micro-hemorrhages in the brain, Amyloid Angiopathy is possible. 4. Generalized intracranial artery ectasia. See also intracranial MRA reported separately. Electronically Signed   By: Genevie Ann M.D.   On: 07/13/2019 01:59   Vas US Carotid  Result Date: 07/14/2019 Carotid Arterial Duplex Study Indications:       TIA. Risk Factors:      Hypertension. Comparison Study:  no prior Performing Technologist: Abram Sander RVS  Examination Guidelines: A complete evaluation includes B-mode imaging, spectral Doppler, color Doppler, and power Doppler as needed of all accessible portions of each vessel.  Bilateral testing is considered an integral part of a complete examination. Limited examinations for reoccurring indications may be performed as noted.  Right Carotid Findings: +----------+--------+--------+--------+------------------+--------+             PSV cm/s EDV cm/s Stenosis Plaque Description Comments  +----------+--------+--------+--------+------------------+--------+  CCA Prox   77       4                 heterogenous                 +----------+--------+--------+--------+------------------+--------+  CCA Distal 80       8                 heterogenous                 +----------+--------+--------+--------+------------------+--------+  ICA Prox   87       10       1-39%    heterogenous                 +----------+--------+--------+--------+------------------+--------+  ICA Distal 152      12                                             +----------+--------+--------+--------+------------------+--------+  ECA        174                                                     +----------+--------+--------+--------+------------------+--------+ +----------+--------+-------+--------+-------------------+  PSV cm/s EDV cms Describe Arm Pressure (mmHG)  +----------+--------+-------+--------+-------------------+  Subclavian 150                                            +----------+--------+-------+--------+-------------------+ +---------+--------+--+--------+-+---------+  Vertebral PSV cm/s 53 EDV cm/s 4 Antegrade  +---------+--------+--+--------+-+---------+  Left Carotid Findings: +----------+--------+--------+--------+------------------+--------+             PSV cm/s EDV cm/s Stenosis Plaque Description Comments  +----------+--------+--------+--------+------------------+--------+  CCA Prox   106                        heterogenous                 +----------+--------+--------+--------+------------------+--------+  CCA Distal 69                         heterogenous                  +----------+--------+--------+--------+------------------+--------+  ICA Prox   108      18       1-39%    heterogenous                 +----------+--------+--------+--------+------------------+--------+  ICA Distal 62       8                                              +----------+--------+--------+--------+------------------+--------+  ECA        138                                                     +----------+--------+--------+--------+------------------+--------+ +----------+--------+--------+--------+-------------------+             PSV cm/s EDV cm/s Describe Arm Pressure (mmHG)  +----------+--------+--------+--------+-------------------+  Subclavian 120                                             +----------+--------+--------+--------+-------------------+ +---------+--------+--+--------+---------+  Vertebral PSV cm/s 49 EDV cm/s Antegrade  +---------+--------+--+--------+---------+  Summary: Right Carotid: Velocities in the right ICA are consistent with a 1-39% stenosis. Left Carotid: Velocities in the left ICA are consistent with a 1-39% stenosis. Vertebrals: Bilateral vertebral arteries demonstrate antegrade flow. *See table(s) above for measurements and observations.  Electronically signed by Delia Heady MD on 07/14/2019 at 8:12:32 AM.    Final      Antimicrobials:   NONE    Subjective: No acute events overnight Resting in bed comfortably had several questions about rehab Is much less confused today  Objective: Vitals:   07/14/19 1859 07/14/19 2028 07/14/19 2320 07/15/19 0526  BP: (!) 157/81 (!) 193/66 (!) 167/78 (!) 179/75  Pulse: 94 62 91 91  Resp: 18 (!) Temp: 97.9 F (36.6 C) 98.5 F (36.9 C) 97.6 F (36.4 C) 97.6 F (36.4 C)  TempSrc: Oral Oral Oral Oral  SpO2: 96% 94% 95% 97%  Weight:      Height:  Intake/Output Summary (Last 24 hours) at 07/15/2019 1310 Last data filed at 07/15/2019 0300 Gross per 24 hour  Intake 360 ml  Output --  Net 360 ml    Filed Weights   07/12/19 1647  Weight: 72.6 kg    Examination:  General exam: Appears calm and comfortable  Respiratory system: Clear to auscultation. Respiratory effort normal. Cardiovascular system: Normal sinus rhythm, 2/6 systolic ejection murmur  gastrointestinal system: Abdomen is nondistended, soft and nontender. No organomegaly or masses felt. Normal bowel sounds heard. Central nervous system: Alert,  No focal neurological deficits. Extremities: Warm well perfused, neurovascular intact Skin: No rashes, lesions or ulcers Psychiatry: Judgement and insight.  Impaired BUT WITH MUCH LESS confusion     Data Reviewed: I have personally reviewed following labs and imaging studies  CBC: Recent Labs  Lab 07/12/19 1712 07/13/19 0500 07/14/19 0626 07/15/19 0541  WBC 6.3 7.0 9.5 8.7  NEUTROABS 3.8  --  7.5 6.1  HGB 13.8 14.8 15.3 15.5  HCT 39.7 42.3 43.6 44.4  MCV 87.3 88.1 87.0 87.1  PLT 158 161 195 174   Basic Metabolic Panel: Recent Labs  Lab 07/12/19 1712 07/13/19 0500 07/14/19 0626 07/15/19 0541  NA 139 141 139 141  K 3.5 3.3* 3.1* 3.5  CL 103 106 106 104  CO2 26 26 23 23   GLUCOSE 146* 115* 151* 135*  BUN 19 16 22  27*  CREATININE 1.20 1.17 1.04 1.21  CALCIUM 8.8* 8.9 8.8* 9.0   GFR: Estimated Creatinine Clearance: 45.5 mL/min (by C-G formula based on SCr of 1.21 mg/dL). Liver Function Tests: Recent Labs  Lab 07/12/19 1712 07/13/19 0500  AST 17 15  ALT 13 13  ALKPHOS 68 67  BILITOT 1.2 1.3*  PROT 6.1* 6.4*  ALBUMIN 3.5 3.6   No results for input(s): LIPASE, AMYLASE in the last 168 hours. No results for input(s): AMMONIA in the last 168 hours. Coagulation Profile: Recent Labs  Lab 07/12/19 1712  INR 1.1   Cardiac Enzymes: No results for input(s): CKTOTAL, CKMB, CKMBINDEX, TROPONINI in the last 168 hours. BNP (last 3 results) No results for input(s): PROBNP in the last 8760 hours. HbA1C: Recent Labs    07/13/19 0500  HGBA1C 5.9*    CBG: No results for input(s): GLUCAP in the last 168 hours. Lipid Profile: Recent Labs    07/13/19 0500  CHOL 226*  HDL 31*  LDLCALC 179*  TRIG 79  CHOLHDL 7.3   Thyroid Function Tests: No results for input(s): TSH, T4TOTAL, FREET4, T3FREE, THYROIDAB in the last 72 hours. Anemia Panel: No results for input(s): VITAMINB12, FOLATE, FERRITIN, TIBC, IRON, RETICCTPCT in the last 72 hours. Sepsis Labs: No results for input(s): PROCALCITON, LATICACIDVEN in the last 168 hours.  Recent Results (from the past 240 hour(s))  SARS CORONAVIRUS 2 (TAT 6-24 HRS) Nasopharyngeal Nasopharyngeal Swab     Status: None   Collection Time: 07/12/19  7:19 PM   Specimen: Nasopharyngeal Swab  Result Value Ref Range Status   SARS Coronavirus 2 NEGATIVE NEGATIVE Final    Comment: (NOTE) SARS-CoV-2 target nucleic acids are NOT DETECTED. The SARS-CoV-2 RNA is generally detectable in upper and lower respiratory specimens during the acute phase of infection. Negative results do not preclude SARS-CoV-2 infection, do not rule out co-infections with other pathogens, and should not be used as the sole basis for treatment or other patient management decisions. Negative results must be combined with clinical observations, patient history, and epidemiological information. The expected result is Negative. Fact Sheet  for Patients: HairSlick.nohttps://www.fda.gov/media/138098/download Fact Sheet for Healthcare Providers: quierodirigir.comhttps://www.fda.gov/media/138095/download This test is not yet approved or cleared by the [place] FDA and  has been authorized for detection and/or diagnosis of SARS-CoV-2 by FDA under an Emergency Use Authorization (EUA). This EUA will remain  in effect (meaning this test can be used) for the duration of the COVID-19 declaration under Section 56 4(b)(1) of the Act, 21 U.S.C. section 360bbb-3(b)(1), unless the authorization is terminated or revoked sooner. Performed at [facility],  [address] [place], [place] [address]          Radiology Studies: No results found.      Scheduled Meds:   stroke: mapping our early stages of recovery book   Does not apply Once   aspirin EC  81 mg Oral Daily   atorvastatin  80 mg Oral Daily   cholecalciferol  1,000 Units Oral QODAY   losartan  25 mg Oral Daily   metoprolol succinate  50 mg Oral Daily   [START ON 07/16/2019] spironolactone  25 mg Oral Q0600   vitamin B-12  1,000 mcg Oral Daily   Continuous Infusions:   LOS: 2 days    Time spent: 6835 MIN    Burke Keelshristopher Lucille Crichlow, MD [facility]  If 7PM-7AM, please contact night-coverage  07/15/2019, 1:10 PM

## 2019-07-15 NOTE — Plan of Care (Signed)
  Problem: Coping: Goal: Will verbalize positive feelings about self Outcome: Progressing   
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
## 2019-07-16 LAB — CBC WITH DIFFERENTIAL/PLATELET
Abs Immature Granulocytes: 0.03 10*3/uL (ref 0.00–0.07)
Basophils Absolute: 0 10*3/uL (ref 0.0–0.1)
Basophils Relative: 0 %
Eosinophils Absolute: 0.4 10*3/uL (ref 0.0–0.5)
Eosinophils Relative: 4 %
HCT: 44.9 % (ref 39.0–52.0)
Hemoglobin: 15.5 g/dL (ref 13.0–17.0)
Immature Granulocytes: 0 %
Lymphocytes Relative: 15 %
Lymphs Abs: 1.6 10*3/uL (ref 0.7–4.0)
MCH: 30.2 pg (ref 26.0–34.0)
MCHC: 34.5 g/dL (ref 30.0–36.0)
MCV: 87.5 fL (ref 80.0–100.0)
Monocytes Absolute: 1 10*3/uL (ref 0.1–1.0)
Monocytes Relative: 10 %
Neutro Abs: 7.6 10*3/uL (ref 1.7–7.7)
Neutrophils Relative %: 71 %
Platelets: 193 10*3/uL (ref 150–400)
RBC: 5.13 MIL/uL (ref 4.22–5.81)
RDW: 13 % (ref 11.5–15.5)
WBC: 10.6 10*3/uL — ABNORMAL HIGH (ref 4.0–10.5)
nRBC: 0 % (ref 0.0–0.2)

## 2019-07-16 LAB — BASIC METABOLIC PANEL
Anion gap: 13 (ref 5–15)
BUN: 34 mg/dL — ABNORMAL HIGH (ref 8–23)
CO2: 24 mmol/L (ref 22–32)
Calcium: 9 mg/dL (ref 8.9–10.3)
Chloride: 105 mmol/L (ref 98–111)
Creatinine, Ser: 1.32 mg/dL — ABNORMAL HIGH (ref 0.61–1.24)
GFR calc Af Amer: 58 mL/min — ABNORMAL LOW (ref >60)
GFR calc non Af Amer: 50 mL/min — ABNORMAL LOW (ref >60)
Glucose, Bld: 130 mg/dL — ABNORMAL HIGH (ref 70–99)
Potassium: 3.5 mmol/L (ref 3.5–5.1)
Sodium: 142 mmol/L (ref 135–145)

## 2019-07-16 NOTE — Progress Notes (Signed)
Physical Therapy Treatment Patient Details Name: Nicholas Hancock MRN: [id number] DOB: 13-Jan-1935 Today's Date: 07/16/2019    History of Present Illness Nicholas Hancock  is a 84 y.o. male,  w hypertension, hyperaldosteronism, moderate aortic regurgitation, elevated psa, vitamin D deficiency, apparently presents with c/o difficulty with word finding per his wife. MRI revealed, acute lacunar infarct in L corona radiata, severl other small acute in bilat occipital lobes and R frontal lobe.    PT Comments    Patient received sleeping in bed. Agrees to PT session. Patient requires min/mod assist with bed mobility. Transfers with mod assist and is unsteady and unbalanced once standing. Very narrow stance and right leaning. Requires cues to widen stance and increase steadiness. Patient ambulated 30 feet in room with RW, requiring mod assistance for balance and max assistance to steer walker. Multimodal cues for proximity to RW, step length on right, upright posture. One LOB when attempting to reach for the door and letting go of walker. Patient with decreased safety awareness and awareness of limitations. He was very fatigued once sitting back on bed. Required mod assist to get safely back in bed.  He would benefit from +2 assistance for safety. Patient will continue to benefit from skilled PT to improve safety, independence with mobility.      Follow Up Recommendations  CIR;SNF(dependent on cognitive ability)     Equipment Recommendations  None recommended by PT    Recommendations for Other Services Rehab consult     Precautions / Restrictions Precautions Precautions: Fall Restrictions Weight Bearing Restrictions: No    Mobility  Bed Mobility Overal bed mobility: Needs Assistance Bed Mobility: Supine to Sit;Sit to Supine     Supine to sit: Min assist Sit to supine: Min assist   General bed mobility comments: min assist for bed mobility due to decreased awareness  Transfers Overall  transfer level: Needs assistance Equipment used: Rolling walker (2 wheeled) Transfers: Sit to/from Stand Sit to Stand: Mod assist;From elevated surface Stand pivot transfers: Min assist       General transfer comment: Patient unsteady with standing leaning to right with feet together off center to left in walker. Cues needed for patient to separate feet to improve balance.  Ambulation/Gait Ambulation/Gait assistance: Mod assist;Max assist Gait Distance (Feet): 30 Feet Assistive device: Rolling walker (2 wheeled) Gait Pattern/deviations: Step-to pattern;Shuffle;Decreased stride length;Narrow base of support;Decreased step length - right Gait velocity: decreased   General Gait Details: Patient is very unsteady with ambulation, one LOB when he let go of walker reaching for door of room. Requires mod assist for balance and max assist for steering of rolling walker. Flexed posture, feet ouside of walker, very unaware of self/limitations   Stairs             Wheelchair Mobility    Modified Rankin (Stroke Patients Only) Modified Rankin (Stroke Patients Only) Pre-Morbid Rankin Score: No significant disability Modified Rankin: Moderately severe disability     Balance Overall balance assessment: Needs assistance Sitting-balance support: Feet supported;Single extremity supported Sitting balance-Leahy Scale: Fair Sitting balance - Comments: pt sat EOB with R lean initially and able to self correct after max cuing, pt able to pull trunk forward after cuing. Difficulty donning R sock seated EOB while heavily leaning forward   Standing balance support: Bilateral upper extremity supported;During functional activity Standing balance-Leahy Scale: Poor Standing balance comment: reliant on RW and physical assist for standing balance and ambulation  Cognition Arousal/Alertness: Lethargic Behavior During Therapy: Impulsive Overall Cognitive Status:  Impaired/Different from baseline Area of Impairment: Problem solving;Following commands;Safety/judgement;Awareness;Attention                 Orientation Level: Disoriented to;Time;Situation Current Attention Level: Focused Memory: Decreased recall of precautions;Decreased short-term memory Following Commands: Follows one step commands inconsistently;Follows one step commands with increased time Safety/Judgement: Decreased awareness of safety;Decreased awareness of deficits Awareness: Intellectual Problem Solving: Slow processing;Difficulty sequencing;Requires verbal cues;Requires tactile cues;Decreased initiation General Comments: multimodal cues, max repitition for following commands      Exercises      General Comments        Pertinent Vitals/Pain Pain Assessment: No/denies pain Pain Score: 0-No pain Pain Intervention(s): Monitored during session    Home Living                      Prior Function            PT Goals (current goals can now be found in the care plan section) Acute Rehab PT Goals Patient Stated Goal: none stated PT Goal Formulation: Patient unable to participate in goal setting Time For Goal Achievement: 07/20/19 Potential to Achieve Goals: Fair Progress towards PT goals: Progressing toward goals    Frequency    Min 4X/week      PT Plan Current plan remains appropriate    Co-evaluation              AM-PAC PT "6 Clicks" Mobility   Outcome Measure  Help needed turning from your back to your side while in a flat bed without using bedrails?: A Little Help needed moving from lying on your back to sitting on the side of a flat bed without using bedrails?: A Lot Help needed moving to and from a bed to a chair (including a wheelchair)?: A Lot Help needed standing up from a chair using your arms (e.g., wheelchair or bedside chair)?: A Lot Help needed to walk in hospital room?: A Lot Help needed climbing 3-5 steps with a railing?  : Total 6 Click Score: 12    End of Session Equipment Utilized During Treatment: Gait belt Activity Tolerance: Patient limited by fatigue Patient left: in bed;with bed alarm set;with call bell/phone within reach Nurse Communication: Mobility status PT Visit Diagnosis: Unsteadiness on feet (R26.81);Difficulty in walking, not elsewhere classified (R26.2);Other abnormalities of gait and mobility (R26.89);Hemiplegia and hemiparesis Hemiplegia - Right/Left: Right Hemiplegia - dominant/non-dominant: Dominant Hemiplegia - caused by: Cerebral infarction     Time: 5102-5852 PT Time Calculation (min) (ACUTE ONLY): 10 min  Charges:  $Gait Training: 8-22 mins                     Henley Blyth, PT, GCS 07/16/19,2:56 PM

## 2019-07-16 NOTE — Progress Notes (Signed)
Inpatient Rehabilitation-Admissions Coordinator   Still no word from insurance regarding determination for CIR.   Will follow up tomorrow.   Jhonnie Garner, OTR/L  Rehab Admissions Coordinator  [phone] 07/16/2019 4:16 PM

## 2019-07-16 NOTE — Progress Notes (Signed)
PROGRESS NOTE    Nicholas Hancock  WUJ:[id number] DOB: 1935/07/10 DOA: 07/12/2019 PCP: Reynold Bowen, MD   Brief Narrative:  Briefly 84 year old male with history of hypertension, aortic regurgitation, first-degree AV block, presents to the ED due to aphasia. MRI brain showed small acute lacunar infarct in the left corona radiata with no associated hemorrhage or mass-effect, several additional small acute to subacute infarcts in the bilateral occipital lobes and right frontal lobe. Stroke work-up in progress. Echo done showed EF 35 to 40%, decreased from previous done about a month ago which was 55 to 60%. EKG with PACs. LDL 179. A1c 5.9. Will consult cardiology in a.m. (just seeing the echo report). Continue telemetry monitoring. Patient has been accepted to [facility] pending medical work-up completion   Assessment & Plan:   Principal Problem:   Acute CVA (cerebrovascular accident) [facility]) Active Problems:   Hypertension   Vitamin D deficiency   Adrenal gland dysfunction (HCC)   Aortic valve insufficiency   1st degree AV block   Atrial tachycardia (HCC)   Chronic systolic CHF (congestive heart failure) (HCC)   Acute stroke: Continue aspirin We will not use dual therapy secondary concerns for CAA per Neuro Echo showed reduced EF, discussed with cardiology saw in consultation, Cards reviewed old echo, they did not feel it was an acute change Continue aspirin, statin Discontinued Plavix.per neuro  Hypertension Continue current medications Spironolactone was prescribed for hypoaldosteronism dose reduced as below Cards recommend low-dose beta-blocker ARB added by cards, losartan 25 mg p.o. daily Will need further evaluation as an outpatient  Atrial tachycardia: Continue with beta-blocker titrate as clinically indicated  Hyperlipidemia continue statin  Aortic regurg with new LV dysfunction: Seen by cards did not feel it was an acute change in EF, Did decrease spiro to  25 daily and added losartan 25 mg p.o. daily. Will follow No acute intervention  DVT prophylaxis: SCD/Compression stockings  Code Status: FULL    Code Status Orders  (From admission, onward)         Start     Ordered   07/12/19 1925  Full code  Continuous     07/12/19 1927        Code Status History    Date Active Date Inactive Code Status Order ID Comments User Context   07/15/2012 1225 07/15/2012 1708 Full Code [id number]  Pedro Earls, MD Inpatient   Advance Care Planning Activity    Advance Directive Documentation     Most Recent Value  Type of Advance Directive  Living will  Pre-existing out of facility DNR order (yellow form or pink MOST form)  --  "MOST" Form in Place?  --     Family Communication: TRIED CALLING WIFE Nicholas Hancock Disposition Plan:   Patient remained inpatient for continued physical therapy post acute CVA, as well as management of irregular heart rhythm and atrial tachycardia.  Patient not stable or safe for discharge.  Consults called: None Admission status: Inpatient   Consultants:   Cardiology, neurology  Procedures:  Dg Chest 2 View  Result Date: 07/12/2019 CLINICAL DATA:  Altered mental status EXAM: CHEST - 2 VIEW COMPARISON:  None. FINDINGS: Heart is borderline in size. Lungs clear. No effusions. No acute bony abnormality. IMPRESSION: No active cardiopulmonary disease. Electronically Signed   By: Rolm Baptise M.D.   On: 07/12/2019 19:52   Ct Head Wo Contrast  Result Date: 07/12/2019 CLINICAL DATA:  Patient with difficulty finding words. EXAM: CT HEAD WITHOUT CONTRAST TECHNIQUE: Contiguous axial  images were obtained from the base of the skull through the vertex without intravenous contrast. COMPARISON:  MRI brain 01/17/2018 FINDINGS: Brain: Ventricles and sulci are prominent compatible with atrophy. Extensive periventricular and subcortical white matter hypodensities compatible with chronic microvascular ischemic changes. No evidence for  acute cortically based infarct, intracranial hemorrhage, mass lesion or mass-effect. Bilateral chronic basal ganglia lacunar infarcts. Vascular: Unremarkable Skull: Intact. Sinuses/Orbits: Polypoid mucosal thickening within the maxillary sinuses bilaterally. Mucosal thickening involving the ethmoid air cells. Mastoid air cells are unremarkable. Orbits are unremarkable. Other: None. IMPRESSION: No acute intracranial process. Atrophy and chronic microvascular ischemic changes. Electronically Signed   By: Annia Beltrew  Davis M.D.   On: 07/12/2019 18:07   Mr Angio Head Wo Contrast  Result Date: 07/13/2019 CLINICAL DATA:  84 year old male with abnormal speech since yesterday. Brain MRI positive for acute left corona radiata and several additional acute to subacute bilateral cerebral infarcts with numerous chronic microhemorrhages and intracranial artery ectasia. EXAM: MRA HEAD WITHOUT CONTRAST TECHNIQUE: Angiographic images of the Circle of Willis were obtained using MRA technique without intravenous contrast. COMPARISON:  Brain MRI today reported separately. FINDINGS: Antegrade flow in the posterior circulation with dominant right vertebral artery. The left vertebral artery functionally terminates in PICA. The right PICA origin is also patent. Mild distal vertebral irregularity without stenosis. Mild to moderate irregularity and stenosis of the basilar artery, most pronounced in the proximal basilar as seen on series 9, image 64 and series 1062, image 17. The basilar artery remains patent. Ectatic basilar tip with no discrete aneurysm. Fetal type left PCA origin. Right posterior communicating artery is diminutive or absent. Patent SCA origins. No significant right PCA stenosis. There is a severe left PCA P3 segment stenosis with preserved distal flow. Mild superimposed multifocal left P2 segment irregularity and stenosis. Antegrade flow in both ICA siphons. Tortuous distal cervical ICAs. Bilateral siphon irregularity with  mild stenosis greater on the left. Normal ophthalmic and left posterior communicating artery origins. Dominant right and diminutive or absent left ACA A1 segments. Ectatic right ICA terminus. Normal MCA and right ACA origins. Anterior communicating artery and tortuous bilateral ACA branches are within normal limits. Right MCA M1 segment and bifurcation are tortuous without stenosis. There is mild right MCA M3 branch irregularity. Left MCA M1 segment is tortuous and irregular with mild stenosis. The left MCA trifurcation is patent without stenosis. Visible left MCA branches are within normal limits. IMPRESSION: 1. 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 Stenoses are most pronounced in the Basilar Artery (Moderate) and Left PCA P3 segment (Severe). Mild stenosis elsewhere. No large vessel or intracranial branch occlusion identified. 2. Dominant right vertebral artery and right ACA A1 segment. Electronically Signed   By: Odessa FlemingH  Hall M.D.   On: 07/13/2019 02:06   Mr Brain Wo Contrast  Result Date: 07/13/2019 CLINICAL DATA:  84 year old male with abnormal speech since yesterday. EXAM: MRI HEAD WITHOUT CONTRAST TECHNIQUE: Multiplanar, multiecho pulse sequences of the brain and surrounding structures were obtained without intravenous contrast. COMPARISON:  Plain head CT 07/12/2019. FINDINGS: Brain: Linear 10 millimeter area of restricted diffusion tracking from the left corona radiata to the posterior lentiform (series 7, image 55). There is also a small area of restricted diffusion in the left occipital lobe periventricular white matter on series 5, image 67. There is also a small linear area of contralateral right occipital lobe restricted diffusion on series 5, image 65 (and series 7, image 44). Furthermore there are one or two punctate, subtle areas of abnormal  white matter diffusion in the right frontal lobe (such as on series 5, image 77) which are not definitely restricted on ADC.  Superimposed chronic lacunar infarcts in the bilateral cerebral white matter, bilateral deep gray nuclei, and occasionally in the cerebellum. Superimposed numerous scattered chronic microhemorrhages in the brain, mostly sparing the deep gray nuclei. No definite chronic cortical encephalomalacia. No midline shift, mass effect, evidence of mass lesion, ventriculomegaly, extra-axial collection or acute intracranial hemorrhage. Cervicomedullary junction and pituitary are within normal limits. Vascular: Major intracranial vascular flow voids are preserved. There is generalized intracranial artery ectasia. The right vertebral artery appears dominant. See also intracranial MRA reported separately. Skull and upper cervical spine: Negative for age visible cervical spine. Visualized bone marrow signal is within normal limits. Sinuses/Orbits: Negative orbits. Multiple maxillary sinus mucous retention cysts. Other: Mastoids are well pneumatized. Visible internal auditory structures appear normal. Scalp and face soft tissues appear negative. IMPRESSION: 1. Small acute lacunar infarct in the left corona radiata with no associated hemorrhage or mass effect. But several additional small acute to subacute infarcts in the bilateral occipital lobes and right frontal lobe. Favor synchronous small vessel disease (see #2, #3) over a recent embolic event. 2. Underlying advanced chronic small vessel ischemic disease. 3. Superimposed numerous chronic micro-hemorrhages in the brain, Amyloid Angiopathy is possible. 4. Generalized intracranial artery ectasia. See also intracranial MRA reported separately. Electronically Signed   By: Odessa FlemingH  Hall M.D.   On: 07/13/2019 01:59   Vas [place] Carotid  Result Date: 07/14/2019 Carotid Arterial Duplex Study Indications:       TIA. Risk Factors:      Hypertension. Comparison Study:  no prior Performing Technologist: Blanch MediaMegan Riddle RVS  Examination Guidelines: A complete evaluation includes B-mode imaging,  spectral Doppler, color Doppler, and power Doppler as needed of all accessible portions of each vessel. Bilateral testing is considered an integral part of a complete examination. Limited examinations for reoccurring indications may be performed as noted.  Right Carotid Findings: +----------+--------+--------+--------+------------------+--------+             PSV cm/s EDV cm/s Stenosis Plaque Description Comments  +----------+--------+--------+--------+------------------+--------+  CCA Prox   77       4                 heterogenous                 +----------+--------+--------+--------+------------------+--------+  CCA Distal 80       8                 heterogenous                 +----------+--------+--------+--------+------------------+--------+  ICA Prox   87       10       1-39%    heterogenous                 +----------+--------+--------+--------+------------------+--------+  ICA Distal 152      12                                             +----------+--------+--------+--------+------------------+--------+  ECA        174                                                     +----------+--------+--------+--------+------------------+--------+ +----------+--------+-------+--------+-------------------+  PSV cm/s EDV cms Describe Arm Pressure (mmHG)  +----------+--------+-------+--------+-------------------+  Subclavian 150                                            +----------+--------+-------+--------+-------------------+ +---------+--------+--+--------+-+---------+  Vertebral PSV cm/s 53 EDV cm/s 4 Antegrade  +---------+--------+--+--------+-+---------+  Left Carotid Findings: +----------+--------+--------+--------+------------------+--------+             PSV cm/s EDV cm/s Stenosis Plaque Description Comments  +----------+--------+--------+--------+------------------+--------+  CCA Prox   106                        heterogenous                  +----------+--------+--------+--------+------------------+--------+  CCA Distal 69                         heterogenous                 +----------+--------+--------+--------+------------------+--------+  ICA Prox   108      18       1-39%    heterogenous                 +----------+--------+--------+--------+------------------+--------+  ICA Distal 62       8                                              +----------+--------+--------+--------+------------------+--------+  ECA        138                                                     +----------+--------+--------+--------+------------------+--------+ +----------+--------+--------+--------+-------------------+             PSV cm/s EDV cm/s Describe Arm Pressure (mmHG)  +----------+--------+--------+--------+-------------------+  Subclavian 120                                             +----------+--------+--------+--------+-------------------+ +---------+--------+--+--------+---------+  Vertebral PSV cm/s 49 EDV cm/s Antegrade  +---------+--------+--+--------+---------+  Summary: Right Carotid: Velocities in the right ICA are consistent with a 1-39% stenosis. Left Carotid: Velocities in the left ICA are consistent with a 1-39% stenosis. Vertebrals: Bilateral vertebral arteries demonstrate antegrade flow. *See table(s) above for measurements and observations.  Electronically signed by Delia Heady MD on 07/14/2019 at 8:12:32 AM.    Final      Antimicrobials:   None   Subjective: No acute events overnight Resting in bed comfortably  Objective: Vitals:   07/15/19 1316 07/15/19 1817 07/15/19 2345 07/16/19 0415  BP: (!) 144/75 (!) 168/64 (!) 152/66 (!) 168/54  Pulse: 80 78 77 77  Resp: 16 18 15 14   Temp: 98 F (36.7 C) 98.2 F (36.8 C) 98 F (36.7 C) 97.7 F (36.5 C)  TempSrc: Oral Oral Oral Oral  SpO2: 96% 94% 94% 94%  Weight:      Height:        Intake/Output Summary (Last 24 hours) at 07/16/2019 1108 Last data  filed at 07/16/2019  0709 Gross per 24 hour  Intake --  Output 150 ml  Net -150 ml   Filed Weights   07/12/19 1647  Weight: 72.6 kg    Examination:  General exam:Appears calm and comfortable  Respiratory system: Clear to auscultation. Respiratory effort normal. Cardiovascular system:Normal sinus rhythm, 2/6 systolic ejection murmur  gastrointestinal system:Abdomen is nondistended, soft and nontender. No organomegaly or masses felt. Normal bowel sounds heard. Central nervous system:Alert,   persistent intermittent word finding difficulty Extremities:Warm well perfused, neurovascular intact Skin: No rashes, lesions or ulcers Psychiatry:Judgement and insight. Impaired-SOME BASELINE CONFUSION     Data Reviewed: I have personally reviewed following labs and imaging studies  CBC: Recent Labs  Lab 07/12/19 1712 07/13/19 0500 07/14/19 0626 07/15/19 0541 07/16/19 0531  WBC 6.3 7.0 9.5 8.7 10.6*  NEUTROABS 3.8  --  7.5 6.1 7.6  HGB 13.8 14.8 15.3 15.5 15.5  HCT 39.7 42.3 43.6 44.4 44.9  MCV 87.3 88.1 87.0 87.1 87.5  PLT 158 161 195 174 193   Basic Metabolic Panel: Recent Labs  Lab 07/12/19 1712 07/13/19 0500 07/14/19 0626 07/15/19 0541 07/16/19 0531  NA 139 141 139 141 142  K 3.5 3.3* 3.1* 3.5 3.5  CL 103 106 106 104 105  CO2 GLUCOSE 146* 115* 151* 135* 130*  BUN 27* 34*  CREATININE 1.20 1.17 1.04 1.21 1.32*  CALCIUM 8.8* 8.9 8.8* 9.0 9.0   GFR: Estimated Creatinine Clearance: 41.7 mL/min (A) (by C-G formula based on SCr of 1.32 mg/dL (H)). Liver Function Tests: Recent Labs  Lab 07/12/19 1712 07/13/19 0500  AST 17 15  ALT 13 13  ALKPHOS 68 67  BILITOT 1.2 1.3*  PROT 6.1* 6.4*  ALBUMIN 3.5 3.6   No results for input(s): LIPASE, AMYLASE in the last 168 hours. No results for input(s): AMMONIA in the last 168 hours. Coagulation Profile: Recent Labs  Lab 07/12/19 1712  INR 1.1   Cardiac Enzymes: No results for input(s): CKTOTAL, CKMB,  CKMBINDEX, TROPONINI in the last 168 hours. BNP (last 3 results) No results for input(s): PROBNP in the last 8760 hours. HbA1C: No results for input(s): HGBA1C in the last 72 hours. CBG: No results for input(s): GLUCAP in the last 168 hours. Lipid Profile: No results for input(s): CHOL, HDL, LDLCALC, TRIG, CHOLHDL, LDLDIRECT in the last 72 hours. Thyroid Function Tests: No results for input(s): TSH, T4TOTAL, FREET4, T3FREE, THYROIDAB in the last 72 hours. Anemia Panel: No results for input(s): VITAMINB12, FOLATE, FERRITIN, TIBC, IRON, RETICCTPCT in the last 72 hours. Sepsis Labs: No results for input(s): PROCALCITON, LATICACIDVEN in the last 168 hours.  Recent Results (from the past 240 hour(s))  SARS CORONAVIRUS 2 (TAT 6-24 HRS) Nasopharyngeal Nasopharyngeal Swab     Status: None   Collection Time: 07/12/19  7:19 PM   Specimen: Nasopharyngeal Swab  Result Value Ref Range Status   SARS Coronavirus 2 NEGATIVE NEGATIVE Final    Comment: (NOTE) SARS-CoV-2 target nucleic acids are NOT DETECTED. The SARS-CoV-2 RNA is generally detectable in upper and lower respiratory specimens during the acute phase of infection. Negative results do not preclude SARS-CoV-2 infection, do not rule out co-infections with other pathogens, and should not be used as the sole basis for treatment or other patient management decisions. Negative results must be combined with clinical observations, patient history, and epidemiological information. The expected result is Negative. Fact Sheet for Patients: HairSlick.no Fact Sheet for Healthcare Providers: quierodirigir.com  This test is not yet approved or cleared by the [place] and  has been authorized for detection and/or diagnosis of SARS-CoV-2 by FDA under an Emergency Use Authorization (EUA). This EUA will remain  in effect (meaning this test can be used) for the duration of the COVID-19  declaration under Section 56 4(b)(1) of the Act, 21 U.S.C. section 360bbb-3(b)(1), unless the authorization is terminated or revoked sooner. Performed at [facility], [address], [place], [place] [address]          Radiology Studies: No results found.      Scheduled Meds:   stroke: mapping our early stages of recovery book   Does not apply Once   aspirin EC  81 mg Oral Daily   atorvastatin  80 mg Oral Daily   cholecalciferol  1,000 Units Oral QODAY   losartan  25 mg Oral Daily   metoprolol succinate  50 mg Oral Daily   spironolactone  25 mg Oral Q0600   vitamin B-12  1,000 mcg Oral Daily   Continuous Infusions:   LOS: 3 days    Time spent: 35 min     Burke Keels, MD [facility]  If 7PM-7AM, please contact night-coverage  07/16/2019, 11:08 AM

## 2019-07-16 NOTE — Progress Notes (Signed)
Occupational Therapy Treatment Patient Details Name: Nicholas Hancock MRN: [id number] DOB: 15-Sep-1934 Today's Date: 07/16/2019    History of present illness Elden Brucato  is a 84 y.o. male,  w hypertension, hyperaldosteronism, moderate aortic regurgitation, elevated psa, vitamin D deficiency, apparently presents with c/o difficulty with word finding per his wife. MRI revealed, acute lacunar infarct in L corona radiata, severl other small acute in bilat occipital lobes and R frontal lobe.   OT comments  Pt making progress with functional goals, continues to require multimodal cues and max repetition for task continuation and completion; decreased attention and impulsivity. Pt sat EOB min A with difficulty donning R sock with heavy forward leaning and impaired R had coordination.  Sit - stand from EOB to RW mod A , ambulated to bathroom with mod a for safe use of RW. Pt unable to problem solve obstacles on the way to the bathroom. Pt participated in ADL grooming tasks standing at sink with RW with min A. OT wil continue to follow acutely  Follow Up Recommendations  CIR;Supervision/Assistance - 24 hour    Equipment Recommendations  Other (comment)(TBD at next venue of care)    Recommendations for Other Services      Precautions / Restrictions Precautions Precautions: Fall Restrictions Weight Bearing Restrictions: No       Mobility Bed Mobility Overal bed mobility: Needs Assistance Bed Mobility: Supine to Sit;Sit to Supine     Supine to sit: Min assist;HOB elevated Sit to supine: Supervision   General bed mobility comments: min A for LE advancement and trunk elevation to get EOB, increased time and effort, frequent cuing for technique, heavy Right lean in sitting  Transfers Overall transfer level: Needs assistance Equipment used: Rolling walker (2 wheeled) Transfers: Sit to/from Stand Sit to Stand: From elevated surface;Mod assist Stand pivot transfers: Min assist        General transfer comment: mod A to get standing from slightly elevated bed, pt required max cuing for hand placement. Unable to problem solve with RW with obstacles ambulating to bathroom    Balance Overall balance assessment: History of Falls;Needs assistance Sitting-balance support: Feet supported;Single extremity supported Sitting balance-Leahy Scale: Fair Sitting balance - Comments: pt sat EOB with R lean initially and able to self correct after max cuing, pt able to pull trunk forward after cuing. Difficulty donning R sock seated EOB while heavily leaning forward                                   ADL either performed or assessed with clinical judgement   ADL Overall ADL's : Needs assistance/impaired     Grooming: Wash/dry hands;Wash/dry face;Minimal assistance;Cueing for sequencing;Cueing for compensatory techniques;Standing Grooming Details (indicate cue type and reason): Pt stood at sink to groom. Pt required min assist to maintain standing and VCs to remember to turn water off and to find the towels to his Left.  Pt impulsive, decreased attrntion span Upper Body Bathing: Cueing for compensatory techniques;Sitting;Minimal assistance Upper Body Bathing Details (indicate cue type and reason): simulated, mod A for cues to stay on task for completion Lower Body Bathing: Moderate assistance;Sit to/from stand;Cueing for compensatory techniques;Cueing for sequencing   Upper Body Dressing : Min guard;Sitting   Lower Body Dressing: Sit to/from stand;Cueing for compensatory techniques;Cueing for sequencing;Moderate assistance Lower Body Dressing Details (indicate cue type and reason): Difficulty donning R sock seated EOB while heavily leaning forward Toilet Transfer: Moderate  assistance;Minimal assistance;Ambulation;RW;Cueing for safety;Cueing for sequencing;Comfort height toilet;Grab bars   Toileting- Clothing Manipulation and Hygiene: Moderate assistance;Sit to/from  stand;Cueing for compensatory techniques       Functional mobility during ADLs: Moderate assistance;Minimal assistance;Rolling walker General ADL Comments: Poor cognition and balance. Difficulty donning R sock seated EOB while heavily leaning forward     Vision Baseline Vision/History: Wears glasses Wears Glasses: At all times     Perception     Praxis      Cognition Arousal/Alertness: Awake/alert Behavior During Therapy: Flat affect;Impulsive Overall Cognitive Status: Impaired/Different from baseline Area of Impairment: Orientation;Attention;Memory;Following commands;Safety/judgement;Awareness;Problem solving                 Orientation Level: Disoriented to;Time;Situation;Place   Memory: Decreased short-term memory Following Commands: Follows one step commands with increased time;Follows multi-step commands inconsistently Safety/Judgement: Decreased awareness of safety;Decreased awareness of deficits   Problem Solving: Slow processing;Decreased initiation;Difficulty sequencing;Requires verbal cues;Requires tactile cues General Comments: multimodal cues, max repitition for following commands        Exercises     Shoulder Instructions       General Comments      Pertinent Vitals/ Pain       Pain Assessment: No/denies pain Pain Score: 0-No pain Pain Intervention(s): Monitored during session  Home Living                                          Prior Functioning/Environment              Frequency  Min 2X/week        Progress Toward Goals  OT Goals(current goals can now be found in the care plan section)  Progress towards OT goals: Progressing toward goals  Acute Rehab OT Goals Patient Stated Goal: none stated  Plan Discharge plan remains appropriate    Co-evaluation                 AM-PAC OT "6 Clicks" Daily Activity     Outcome Measure   Help from another person eating meals?: A Little Help from another  person taking care of personal grooming?: A Little Help from another person toileting, which includes using toliet, bedpan, or urinal?: A Little Help from another person bathing (including washing, rinsing, drying)?: A Lot Help from another person to put on and taking off regular upper body clothing?: A Little Help from another person to put on and taking off regular lower body clothing?: A Lot 6 Click Score: 16    End of Session Equipment Utilized During Treatment: Rolling walker;Gait belt;Other (comment)(3 in 1)  OT Visit Diagnosis: Other abnormalities of gait and mobility (R26.89);Other symptoms and signs involving the nervous system (R29.898);Other symptoms and signs involving cognitive function   Activity Tolerance Patient tolerated treatment well   Patient Left in bed;with call bell/phone within reach;with bed alarm set   Nurse Communication          Time: 4196-2229 OT Time Calculation (min): 18 min  Charges: OT General Charges $OT Visit: 1 Visit OT Treatments $Self Care/Home Management : 8-22 mins     Galen Manila 07/16/2019, 12:37 PM

## 2019-07-17 ENCOUNTER — Other Ambulatory Visit: Payer: Self-pay

## 2019-07-17 ENCOUNTER — Inpatient Hospital Stay (HOSPITAL_COMMUNITY)
Admission: RE | Admit: 2019-07-17 | Discharge: 2019-08-03 | DRG: 056 | Disposition: A | Discharge status: Home Health | Payer: Medicare Other | Source: Intra-hospital | Attending: Physical Medicine & Rehabilitation | Admitting: Physical Medicine & Rehabilitation

## 2019-07-17 DIAGNOSIS — N390 Urinary tract infection, site not specified: Secondary | ICD-10-CM | POA: Diagnosis present

## 2019-07-17 DIAGNOSIS — I5021 Acute systolic (congestive) heart failure: Secondary | ICD-10-CM

## 2019-07-17 DIAGNOSIS — Z79899 Other long term (current) drug therapy: Secondary | ICD-10-CM | POA: Diagnosis not present

## 2019-07-17 DIAGNOSIS — Z7982 Long term (current) use of aspirin: Secondary | ICD-10-CM | POA: Diagnosis not present

## 2019-07-17 DIAGNOSIS — I639 Cerebral infarction, unspecified: Secondary | ICD-10-CM | POA: Diagnosis not present

## 2019-07-17 DIAGNOSIS — R4587 Impulsiveness: Secondary | ICD-10-CM

## 2019-07-17 DIAGNOSIS — B952 Enterococcus as the cause of diseases classified elsewhere: Secondary | ICD-10-CM | POA: Diagnosis present

## 2019-07-17 DIAGNOSIS — Z833 Family history of diabetes mellitus: Secondary | ICD-10-CM | POA: Diagnosis not present

## 2019-07-17 DIAGNOSIS — E785 Hyperlipidemia, unspecified: Secondary | ICD-10-CM | POA: Diagnosis present

## 2019-07-17 DIAGNOSIS — E876 Hypokalemia: Secondary | ICD-10-CM | POA: Diagnosis present

## 2019-07-17 DIAGNOSIS — R944 Abnormal results of kidney function studies: Secondary | ICD-10-CM

## 2019-07-17 DIAGNOSIS — I10 Essential (primary) hypertension: Secondary | ICD-10-CM

## 2019-07-17 DIAGNOSIS — R350 Frequency of micturition: Secondary | ICD-10-CM | POA: Diagnosis not present

## 2019-07-17 DIAGNOSIS — I69320 Aphasia following cerebral infarction: Secondary | ICD-10-CM | POA: Diagnosis present

## 2019-07-17 DIAGNOSIS — I110 Hypertensive heart disease with heart failure: Secondary | ICD-10-CM | POA: Diagnosis present

## 2019-07-17 DIAGNOSIS — K5900 Constipation, unspecified: Secondary | ICD-10-CM | POA: Diagnosis present

## 2019-07-17 DIAGNOSIS — Z823 Family history of stroke: Secondary | ICD-10-CM | POA: Diagnosis not present

## 2019-07-17 DIAGNOSIS — Z87891 Personal history of nicotine dependence: Secondary | ICD-10-CM

## 2019-07-17 LAB — URINALYSIS, ROUTINE W REFLEX MICROSCOPIC
Bilirubin Urine: NEGATIVE
Glucose, UA: NEGATIVE mg/dL
Hgb urine dipstick: NEGATIVE
Ketones, ur: NEGATIVE mg/dL
Nitrite: NEGATIVE
Protein, ur: 30 mg/dL — AB
Specific Gravity, Urine: 1.028 (ref 1.005–1.030)
pH: 5 (ref 5.0–8.0)

## 2019-07-17 LAB — CBC WITH DIFFERENTIAL/PLATELET
Abs Immature Granulocytes: 0.05 10*3/uL (ref 0.00–0.07)
Basophils Absolute: 0 10*3/uL (ref 0.0–0.1)
Basophils Relative: 0 %
Eosinophils Absolute: 0.5 10*3/uL (ref 0.0–0.5)
Eosinophils Relative: 5 %
HCT: 44.5 % (ref 39.0–52.0)
Hemoglobin: 15.2 g/dL (ref 13.0–17.0)
Immature Granulocytes: 1 %
Lymphocytes Relative: 12 %
Lymphs Abs: 1.2 10*3/uL (ref 0.7–4.0)
MCH: 30.3 pg (ref 26.0–34.0)
MCHC: 34.2 g/dL (ref 30.0–36.0)
MCV: 88.6 fL (ref 80.0–100.0)
Monocytes Absolute: 1 10*3/uL (ref 0.1–1.0)
Monocytes Relative: 9 %
Neutro Abs: 7.8 10*3/uL — ABNORMAL HIGH (ref 1.7–7.7)
Neutrophils Relative %: 73 %
Platelets: 172 10*3/uL (ref 150–400)
RBC: 5.02 MIL/uL (ref 4.22–5.81)
RDW: 13.1 % (ref 11.5–15.5)
WBC: 10.7 10*3/uL — ABNORMAL HIGH (ref 4.0–10.5)
nRBC: 0 % (ref 0.0–0.2)

## 2019-07-17 LAB — BASIC METABOLIC PANEL
Anion gap: 10 (ref 5–15)
BUN: 45 mg/dL — ABNORMAL HIGH (ref 8–23)
CO2: 22 mmol/L (ref 22–32)
Calcium: 8.9 mg/dL (ref 8.9–10.3)
Chloride: 110 mmol/L (ref 98–111)
Creatinine, Ser: 1.19 mg/dL (ref 0.61–1.24)
GFR calc Af Amer: >60 mL/min (ref >60)
GFR calc non Af Amer: 57 mL/min — ABNORMAL LOW (ref >60)
Glucose, Bld: 125 mg/dL — ABNORMAL HIGH (ref 70–99)
Potassium: 3.6 mmol/L (ref 3.5–5.1)
Sodium: 142 mmol/L (ref 135–145)

## 2019-07-17 MED ORDER — ACETAMINOPHEN 650 MG RE SUPP: 650.0000 mg | RECTAL | Status: DC | PRN

## 2019-07-17 MED ORDER — LORAZEPAM 2 MG/ML IJ SOLN
0.5000 mg | Freq: Four times a day (QID) | INTRAMUSCULAR | Status: DC | PRN
  Administered 2019-07-29: 0.5 mg via INTRAMUSCULAR
  Filled 2019-07-17 (×2): qty 1

## 2019-07-17 MED ORDER — METOPROLOL SUCCINATE ER 50 MG PO TB24: 50.0000 mg | ORAL_TABLET | Freq: Every day | ORAL | 0 refills | Status: DC

## 2019-07-17 MED ORDER — VITAMIN B-12 1000 MCG PO TABS
1000.0000 ug | ORAL_TABLET | Freq: Every day | ORAL | Status: DC
  Administered 2019-07-18 – 2019-08-03 (×17): 1000 ug via ORAL
  Filled 2019-07-17 (×18): qty 1

## 2019-07-17 MED ORDER — LOSARTAN POTASSIUM 25 MG PO TABS: 25.0000 mg | ORAL_TABLET | Freq: Every day | ORAL | 0 refills | Status: DC

## 2019-07-17 MED ORDER — LORAZEPAM 0.5 MG PO TABS
0.5000 mg | ORAL_TABLET | Freq: Four times a day (QID) | ORAL | Status: DC | PRN
  Administered 2019-07-17 – 2019-07-25 (×4): 0.5 mg via ORAL
  Filled 2019-07-17 (×5): qty 1

## 2019-07-17 MED ORDER — ASPIRIN EC 81 MG PO TBEC
81.0000 mg | DELAYED_RELEASE_TABLET | Freq: Every day | ORAL | Status: DC
  Administered 2019-07-18 – 2019-08-03 (×17): 81 mg via ORAL
  Filled 2019-07-17 (×17): qty 1

## 2019-07-17 MED ORDER — ATORVASTATIN CALCIUM 80 MG PO TABS
80.0000 mg | ORAL_TABLET | Freq: Every day | ORAL | Status: DC
  Administered 2019-07-18 – 2019-08-02 (×16): 80 mg via ORAL
  Filled 2019-07-17 (×17): qty 1

## 2019-07-17 MED ORDER — ACETAMINOPHEN 325 MG PO TABS
650.0000 mg | ORAL_TABLET | ORAL | Status: DC | PRN
  Administered 2019-07-22 – 2019-08-01 (×4): 650 mg via ORAL
  Filled 2019-07-17 (×4): qty 2

## 2019-07-17 MED ORDER — LOSARTAN POTASSIUM 50 MG PO TABS
25.0000 mg | ORAL_TABLET | Freq: Every day | ORAL | Status: DC
  Administered 2019-07-18 – 2019-07-30 (×13): 25 mg via ORAL
  Filled 2019-07-17 (×13): qty 1

## 2019-07-17 MED ORDER — VITAMIN D 25 MCG (1000 UNIT) PO TABS
1000.0000 [IU] | ORAL_TABLET | ORAL | Status: DC
  Administered 2019-07-18 – 2019-08-03 (×9): 1000 [IU] via ORAL
  Filled 2019-07-17 (×12): qty 1

## 2019-07-17 MED ORDER — SPIRONOLACTONE 50 MG PO TABS: 25.0000 mg | ORAL_TABLET | Freq: Every day | ORAL | 3 refills | Status: DC

## 2019-07-17 MED ORDER — SPIRONOLACTONE 25 MG PO TABS
25.0000 mg | ORAL_TABLET | Freq: Every day | ORAL | Status: DC
  Administered 2019-07-18 – 2019-07-21 (×4): 25 mg via ORAL
  Filled 2019-07-17 (×4): qty 1

## 2019-07-17 MED ORDER — ACETAMINOPHEN 160 MG/5ML PO SOLN: 650.0000 mg | ORAL | Status: DC | PRN

## 2019-07-17 MED ORDER — TRAZODONE HCL 50 MG PO TABS
50.0000 mg | ORAL_TABLET | Freq: Every evening | ORAL | Status: DC | PRN
  Administered 2019-07-17 – 2019-08-02 (×12): 50 mg via ORAL
  Filled 2019-07-17 (×14): qty 1

## 2019-07-17 MED ORDER — METOPROLOL SUCCINATE ER 50 MG PO TB24
50.0000 mg | ORAL_TABLET | Freq: Every day | ORAL | Status: DC
  Administered 2019-07-18 – 2019-08-03 (×17): 50 mg via ORAL
  Filled 2019-07-17 (×17): qty 1

## 2019-07-17 MED ORDER — SORBITOL 70 % SOLN
30.0000 mL | Freq: Every day | Status: DC | PRN
  Administered 2019-07-24 – 2019-07-30 (×2): 30 mL via ORAL
  Filled 2019-07-17 (×2): qty 30

## 2019-07-17 NOTE — H&P (Signed)
Physical Medicine and Rehabilitation Admission H&P    : HPI: Nicholas Hancock is an 84 year old right-handed male history of hypertension, adrenal gland dysfunction, prior history of TIA/CVA maintained on aspirin followed by neurology services Dr. Adriana Mccallum, remote tobacco abuse.  Per chart review lives with spouse independent with assistive device prior to admission.  1 level home.  Patient lives with his wife independent living [address]. Presented 07/12/2019 with aphasia and leaning to the right.  Cranial CT scan negative.  Patient did not receive TPA.  MRI of the brain showed small acute lacunar infarction in the left corona radiata with no associated hemorrhage or mass-effect.  Several small additional acute to subacute infarcts of the bilateral occipital lobes and right frontal lobe.  MRA with no large vessel occlusion.  Echocardiogram with ejection fraction of 40% without emboli.  Admission chemistries unremarkable.  Urine drug screen negative.  SARS coronavirus negative.  Neurology follow-up maintained on aspirin for CVA prophylaxis.  Neurology did not feel patient was a candidate for DAPT given concern for possible CAA.  Tolerating regular diet.  Cardiology service did follow-up for review of low ejection fraction identified on echocardiogram.  Telemetry reviewed showed short burst of atrial tachycardia not consistent with atrial fibrillation or flutter.  Recommendations to start low-dose Toprol/Aldactone as well as continue low-dose aspirin.   Therapy evaluations completed patient was admitted for a comprehensive rehab program.  Review of Systems  Constitutional: Negative for chills and fever.  HENT: Negative for hearing loss.   Eyes: Negative for blurred vision and double vision.  Respiratory: Negative for cough and shortness of breath.   Cardiovascular: Positive for palpitations and leg swelling. Negative for chest pain.  Gastrointestinal: Positive for constipation. Negative for  heartburn, nausea and vomiting.  Genitourinary: Positive for urgency. Negative for dysuria, flank pain and hematuria.  Musculoskeletal: Positive for joint pain and myalgias.  Skin: Negative for rash.  Neurological: Positive for speech change and weakness.  All other systems reviewed and are negative.  Past Medical History:  Diagnosis Date   1st degree AV block    Adrenal gland dysfunction (HCC)    hyperaldesteroa- takes diuretic for over production of aldosterone   Aortic valve insufficiency    Arthritis    hands   CVA (cerebral vascular accident) (HCC)    Elevated PSA    monitored by Dr. Earlene Plater   Hypertension    takes diuretic for adrenal gland dysfunction, BP will be high without diuretic   Vitamin D deficiency    Past Surgical History:  Procedure Laterality Date   HAND SURGERY  09/21/1987   Enlarged mass on right palm overlying right long finger metacarpophalangeal joint removed   INGUINAL HERNIA REPAIR  07/15/2012   Procedure: LAPAROSCOPIC BILATERAL INGUINAL HERNIA REPAIR;  Surgeon: Valarie Merino, MD;  Location: [facility];  Service: General;  Laterality: Bilateral;  Laparoscopic Bilateral Inguinal Hernia Repairs with Mesh   left knee arthroscopy  3 years ago   PELVIC FRACTURE SURGERY     chippped in high school   TONSILLECTOMY AND ADENOIDECTOMY     Family History  Problem Relation Age of Onset   Stroke Father    Diabetes Father        `   Cancer Father        unknown   Heart disease Mother    Cancer Mother        unknown   Social History:  reports that he quit smoking about 34 years ago. His  smoking use included pipe. He quit after 20.00 years of use. He has never used smokeless tobacco. He reports current alcohol use. He reports that he does not use drugs. Allergies: No Known Allergies Medications Prior to Admission  Medication Sig Dispense Refill   aspirin EC 81 MG tablet Take 81 mg by mouth daily.     atorvastatin (LIPITOR) 80 MG tablet Take  1 tablet (80 mg total) by mouth daily. 30 tablet 5   Calcium 167 MG CAPS Take 1 capsule by mouth daily.     Cholecalciferol (VITAMIN D-3) 5000 UNITS TABS Take 1 tablet by mouth every other day.     Flaxseed, Linseed, (FLAX SEEDS PO) Take 1 tablet by mouth daily.     [START ON 07/18/2019] losartan (COZAAR) 25 MG tablet Take 1 tablet (25 mg total) by mouth daily. 30 tablet 0   [START ON 07/18/2019] metoprolol succinate (TOPROL-XL) 50 MG 24 hr tablet Take 1 tablet (50 mg total) by mouth daily. Take with or immediately following a meal. 30 tablet 0   Multiple Vitamin (MULTIVITAMIN WITH MINERALS) TABS Take 1 tablet by mouth daily.     Multiple Vitamins-Minerals (ZINC) LOZG Take 1 lozenge by mouth daily as needed (For cold symptoms).     Omega-3 Fatty Acids (SALMON OIL-1000 PO) Take 1,000 mg by mouth daily.     spironolactone (ALDACTONE) 50 MG tablet Take 0.5 tablets (25 mg total) by mouth daily. 90 tablet 3   Ubiquinol 100 MG CAPS Take 1 capsule by mouth daily.     vitamin B-12 (CYANOCOBALAMIN) 1000 MCG tablet Take 1,000 mcg by mouth daily.     vitamin C (ASCORBIC ACID) 500 MG tablet Take 500 mg by mouth daily.      Drug Regimen Review Drug regimen was reviewed and remains appropriate with no significant issues identified  Home: Home Living Family/patient expects to be discharged to:: Private residence Living Arrangements: Spouse/significant other Available Help at Discharge: Family, Available 24 hours/day Type of Home: House Home Access: Level entry Home Layout: One level Bathroom Shower/Tub: Chiropodist: Standard Home Equipment: Environmental consultant - 2 wheels, Cane - single point, Grab bars - toilet, Grab bars - tub/shower Additional Comments: unsure of patients reliability  Lives With: Spouse   Functional History: Prior Function Level of Independence: Independent with assistive device(s) Comments: uses RW, reports wife drives and does grocery shopping. Unsure of  accuracy of PLOF report  Functional Status:  Mobility: Bed Mobility Overal bed mobility: Needs Assistance Bed Mobility: Supine to Sit, Sit to Supine Supine to sit: Min assist Sit to supine: Min assist General bed mobility comments: min assist for bed mobility due to decreased awareness Transfers Overall transfer level: Needs assistance Equipment used: Rolling walker (2 wheeled) Transfers: Sit to/from Stand Sit to Stand: Mod assist, From elevated surface Stand pivot transfers: Min assist General transfer comment: Patient unsteady with standing leaning to right with feet together off center to left in walker. Cues needed for patient to separate feet to improve balance. Ambulation/Gait Ambulation/Gait assistance: Mod assist, Max assist Gait Distance (Feet): 30 Feet Assistive device: Rolling walker (2 wheeled) Gait Pattern/deviations: Step-to pattern, Shuffle, Decreased stride length, Narrow base of support, Decreased step length - right General Gait Details: Patient is very unsteady with ambulation, one LOB when he let go of walker reaching for door of room. Requires mod assist for balance and max assist for steering of rolling walker. Flexed posture, feet ouside of walker, very unaware of self/limitations Gait velocity: decreased  ADL: ADL Overall ADL's : Needs assistance/impaired Eating/Feeding: Sitting, Minimal assistance Grooming: Wash/dry hands, Wash/dry face, Minimal assistance, Cueing for sequencing, Cueing for compensatory techniques, Standing Grooming Details (indicate cue type and reason): Pt stood at sink to groom. Pt required min assist to maintain standing and VCs to remember to turn water off and to find the towels to his Left.  Pt impulsive, decreased attrntion span Upper Body Bathing: Cueing for compensatory techniques, Sitting, Minimal assistance Upper Body Bathing Details (indicate cue type and reason): simulated, mod A for cues to stay on task for completion Lower  Body Bathing: Moderate assistance, Sit to/from stand, Cueing for compensatory techniques, Cueing for sequencing Lower Body Bathing Details (indicate cue type and reason): cues to continue with the task. Upper Body Dressing : Min guard, Sitting Upper Body Dressing Details (indicate cue type and reason): min assist to donn gown around the back side of himself.  This is unfamiliar so may be difficult due to this.  Lower Body Dressing: Sit to/from stand, Cueing for compensatory techniques, Cueing for sequencing, Moderate assistance Lower Body Dressing Details (indicate cue type and reason): Difficulty donning R sock seated EOB while heavily leaning forward Toilet Transfer: Moderate assistance, Minimal assistance, Ambulation, RW, Cueing for safety, Cueing for sequencing, Comfort height toilet, Grab bars Toilet Transfer Details (indicate cue type and reason): Pt walked to bathroom to toilet with mod assist.  Pt not safe with walker use pushing walker far ahead of himself.  Pt did run into furniture on the R and on the L when using the walker and was unable to correct without therapist realigning walker. Toileting- Clothing Manipulation and Hygiene: Moderate assistance, Sit to/from stand, Cueing for compensatory techniques Toileting - Clothing Manipulation Details (indicate cue type and reason): mod assist for balance while therpist cleaned pt. Functional mobility during ADLs: Moderate assistance, Minimal assistance, Rolling walker General ADL Comments: Poor cognition and balance. Difficulty donning R sock seated EOB while heavily leaning forward  Cognition: Cognition Overall Cognitive Status: Impaired/Different from baseline Orientation Level: Oriented to person Attention: Focused, Sustained Focused Attention: Appears intact Sustained Attention: Appears intact Memory: Impaired Memory Impairment: Decreased short term memory Awareness: Impaired Problem Solving: Impaired Problem Solving Impairment:  Verbal complex Executive Function: Reasoning Reasoning: Impaired Cognition Arousal/Alertness: Lethargic Behavior During Therapy: Impulsive Overall Cognitive Status: Impaired/Different from baseline Area of Impairment: Problem solving, Following commands, Safety/judgement, Awareness, Attention Orientation Level: Disoriented to, Time, Situation Current Attention Level: Focused Memory: Decreased recall of precautions, Decreased short-term memory Following Commands: Follows one step commands inconsistently, Follows one step commands with increased time Safety/Judgement: Decreased awareness of safety, Decreased awareness of deficits Awareness: Intellectual Problem Solving: Slow processing, Difficulty sequencing, Requires verbal cues, Requires tactile cues, Decreased initiation General Comments: multimodal cues, max repitition for following commands  Physical Exam: Blood pressure (!) 146/62, pulse 67, temperature (!) 97.3 F (36.3 C), resp. rate 19, SpO2 97 %.  Physical Exam  Gen: no distress, normal appearing HEENT: oral mucosa pink and moist, NCAT Cardio: Reg rate Chest: normal effort, normal rate of breathing Abd: soft, non-distended Ext: no edema Skin: intact Neurological: Patient is lethargic but arousable.  Speech is dysarthric but intelligible.   He makes good eye contact with examiner.  He does follow simple commands but has difficulty with problem-solving.  He was able to tell me he was in the hospital as well as his name.  He does display limited insight and awareness to his deficits.   Musculoskeletal: No focal weakness, 4+/5 strength throughout, made good  effort during examination.  Psych: pleasant, normal affect  Results for orders placed or performed during the hospital encounter of 07/12/19 (from the past 48 hour(s))  Urinalysis, Routine w reflex microscopic     Status: Abnormal   Collection Time: 07/16/19 12:21 AM  Result Value Ref Range   Color, Urine YELLOW YELLOW    APPearance CLEAR CLEAR   Specific Gravity, Urine 1.028 1.005 - 1.030   pH 5.0 5.0 - 8.0   Glucose, UA NEGATIVE NEGATIVE mg/dL   Hgb urine dipstick NEGATIVE NEGATIVE   Bilirubin Urine NEGATIVE NEGATIVE   Ketones, ur NEGATIVE NEGATIVE mg/dL   Protein, ur 30 (A) NEGATIVE mg/dL   Nitrite NEGATIVE NEGATIVE   Leukocytes,Ua TRACE (A) NEGATIVE   RBC / HPF 0-5 0 - 5 RBC/hpf   WBC, UA 21-50 0 - 5 WBC/hpf   Bacteria, UA MANY (A) NONE SEEN   Squamous Epithelial / LPF 0-5 0 - 5   Mucus PRESENT     Comment: Performed at [facility], [address] [place], [place] [address]  CBC with Differential/Platelet     Status: Abnormal   Collection Time: 07/16/19  5:31 AM  Result Value Ref Range   WBC 10.6 (H) 4.0 - 10.5 K/uL   RBC 5.13 4.22 - 5.81 MIL/uL   Hemoglobin 15.5 13.0 - 17.0 g/dL   HCT 60.4 54.0 - 98.1 %   MCV 87.5 80.0 - 100.0 fL   MCH 30.2 26.0 - 34.0 pg   MCHC 34.5 30.0 - 36.0 g/dL   RDW 19.1 47.8 - 29.5 %   Platelets 193 150 - 400 K/uL   nRBC 0.0 0.0 - 0.2 %   Neutrophils Relative % 71 %   Neutro Abs 7.6 1.7 - 7.7 K/uL   Lymphocytes Relative 15 %   Lymphs Abs 1.6 0.7 - 4.0 K/uL   Monocytes Relative 10 %   Monocytes Absolute 1.0 0.1 - 1.0 K/uL   Eosinophils Relative 4 %   Eosinophils Absolute 0.4 0.0 - 0.5 K/uL   Basophils Relative 0 %   Basophils Absolute 0.0 0.0 - 0.1 K/uL   Immature Granulocytes 0 %   Abs Immature Granulocytes 0.03 0.00 - 0.07 K/uL    Comment: Performed at [facility], [address], [place], [place] [address]  Basic metabolic panel     Status: Abnormal   Collection Time: 07/16/19  5:31 AM  Result Value Ref Range   Sodium 142 135 - 145 mmol/L   Potassium 3.5 3.5 - 5.1 mmol/L   Chloride 105 98 - 111 mmol/L   CO2 24 22 - 32 mmol/L   Glucose, Bld 130 (H) 70 - 99 mg/dL   BUN 34 (H) 8 - 23 mg/dL   Creatinine, Ser 8.65 (H) 0.61 - 1.24 mg/dL   Calcium 9.0 8.9 - 78.4 mg/dL   GFR calc non Af Amer 50 (L) >60 mL/min   GFR calc Af Amer 58 (L) >60  mL/min   Anion gap 13 5 - 15    Comment: Performed at [facility], [address] [place], [place] [address]  Culture, Urine     Status: None (Preliminary result)   Collection Time: 07/16/19  2:09 PM   Specimen: Urine, Catheterized  Result Value Ref Range   Specimen Description URINE, CATHETERIZED    Special Requests NONE    Culture      CULTURE REINCUBATED FOR BETTER GROWTH Performed at [facility], [address],  [place], [place] [address]    Report Status PENDING   CBC with Differential/Platelet     Status: Abnormal   Collection Time: 07/17/19  5:57 AM  Result Value Ref Range   WBC 10.7 (H) 4.0 - 10.5 K/uL   RBC 5.02 4.22 - 5.81 MIL/uL   Hemoglobin 15.2 13.0 - 17.0 g/dL   HCT 60.444.5 54.039.0 - 98.152.0 %   MCV 88.6 80.0 - 100.0 fL   MCH 30.3 26.0 - 34.0 pg   MCHC 34.2 30.0 - 36.0 g/dL   RDW 19.113.1 47.811.5 - 29.515.5 %   Platelets 172 150 - 400 K/uL   nRBC 0.0 0.0 - 0.2 %   Neutrophils Relative % 73 %   Neutro Abs 7.8 (H) 1.7 - 7.7 K/uL   Lymphocytes Relative 12 %   Lymphs Abs 1.2 0.7 - 4.0 K/uL   Monocytes Relative 9 %   Monocytes Absolute 1.0 0.1 - 1.0 K/uL   Eosinophils Relative 5 %   Eosinophils Absolute 0.5 0.0 - 0.5 K/uL   Basophils Relative 0 %   Basophils Absolute 0.0 0.0 - 0.1 K/uL   Immature Granulocytes 1 %   Abs Immature Granulocytes 0.05 0.00 - 0.07 K/uL    Comment: Performed at [facility], [address] [place], [place] [address]  Basic metabolic panel     Status: Abnormal   Collection Time: 07/17/19  5:57 AM  Result Value Ref Range   Sodium 142 135 - 145 mmol/L   Potassium 3.6 3.5 - 5.1 mmol/L   Chloride 110 98 - 111 mmol/L   CO2 22 22 - 32 mmol/L   Glucose, Bld 125 (H) 70 - 99 mg/dL   BUN 45 (H) 8 - 23 mg/dL   Creatinine, Ser 8.651.19 0.61 - 1.24 mg/dL   Calcium 8.9 8.9 - 78.410.3 mg/dL   GFR calc non Af Amer 57 (L) >60 mL/min   GFR calc Af Amer >60 >60 mL/min   Anion gap 10 5 - 15    Comment: Performed at [facility], [address] [place], [place] [address]   No results found.  Medical Problem List and Plan: 1.  Mild aphasia with decreased functional mobility secondary to small acute left basal ganglia corona radiata and multiple small subacute bilateral occipital and right frontal lobe infarcts as well as history of TIA/CVA 2019 [facility]  -patient may shower  -ELOS/Goals: mod I in PT, OT, SLP  -currently ambulating 30 feet w/ RW, modA balance and maxA for steering walker. 2.  Antithrombotics: -DVT/anticoagulation: SCDs  -antiplatelet therapy: Aspirin 81 mg daily 3. Pain Management: Tylenol as needed 4. Mood: Provide emotional support  -antipsychotic agents: N/A 5. Neuropsych: This patient is capable of making decisions on his own behalf. 6. Skin/Wound Care: Routine skin checks 7. Fluids/Electrolytes/Nutrition: Routine in and outs with follow-up chemistries 8.  Permissive hypertension.  Toprol-XL 50 mg daily,Cozaar 25 mg daily, Aldactone 50 mg daily.  Monitor with increased mobility 9.  Acute systolic heart failure.  Follow-up per cardiology services.  Continue Aldactone as well as beta-blocker. Echo done on acute side shows EF 35=40% which was decreased from Echo 1 month prior with EF 55-60%. 10.  Hyperlipidemia.  Lipitor 11.  Remote tobacco abuse.  Counseling  Mcarthur RossettiDaniel J Angiulli, PA-C 07/17/2019   I have personally performed a face to face diagnostic evaluation, including, but not limited to relevant history and physical exam findings, of this patient and developed relevant assessment and plan.  Additionally, I have reviewed and concur with the physician assistant's documentation above.  The patient's status has not changed. The original post admission physician evaluation remains appropriate, and any changes from the pre-admission screening or documentation from the acute chart are noted above.   Sula SodaKrutika Trenton Passow, MD

## 2019-07-17 NOTE — Discharge Summary (Signed)
Physician Discharge Summary  Nicholas Hancock LKG:[id number] DOB: 04-06-1935 DOA: 07/12/2019  PCP: Adrian Prince, MD  Admit date: 07/12/2019 Discharge date: 07/17/2019  Admitted From: Home Disposition: Inpatient rehab  Recommendations for Outpatient Follow-up:  1. Follow up with PCP in 1-2 weeks 2. Please obtain BMP/CBC in one week  Home Health: No Equipment/Devices: None  Discharge Condition: Stable CODE STATUS: Full Diet recommendation: Heart Healthy / Carb Modified / Regular / Dysphagia   Brief/Interim Summary: Briefly 84 year old male with history of hypertension, aortic regurgitation, first-degree AV block, presents to the ED due to aphasia. MRI brain showed small acute lacunar infarct in the left corona radiata with no associated hemorrhage or mass-effect, several additional small acute to subacute infarcts in the bilateral occipital lobes and right frontal lobe. Stroke work-up in progress. Echo done showed EF 35 to 40%, decreased from previous done about a month ago which was 55 to 60%. EKG with PACs. LDL 179. A1c 5.9. Will consult cardiology in a.m. (just seeing the echo report). Continue telemetry monitoring. Patient has been accepted to [facility] pending medical work-up completion.  Patient's cardiac medications were mildly adjusted to lower the dose of spironolactone in order to add on losartan.  Blocker was also added during this admission per cardiology patient was continued on aspirin and statin therapy however his Plavix was discontinued per neurology.  Discharge Diagnoses:  Principal Problem:   Acute CVA (cerebrovascular accident) (HCC) Active Problems:   Hypertension   Vitamin D deficiency   Adrenal gland dysfunction (HCC)   Aortic valve insufficiency   1st degree AV block   Atrial tachycardia (HCC)   Chronic systolic CHF (congestive heart failure) (HCC)  Acute stroke: Continue aspirin We will not use dual therapy secondary concerns for CAA per Neuro Echo  showed reduced EF, discussed with cardiology saw in consultation, Cards reviewed old echo, they did not feel it was an acute change Continue aspirin, statin Discontinued Plavix.per neuro  Hypertension Continue current medications Spironolactone was prescribed for hypoaldosteronismdose reduced as below Cards recommend low-dose beta-blocker ARB added by cards, losartan 25 mg p.o. daily Will need further evaluation as an outpatient  Atrial tachycardia: Continue with beta-blocker titrate as clinically indicated  Hyperlipidemia continue statin  Aortic regurgwithnewLV dysfunction: Seen by cardsdid not feel it was an acute change in EF, Did decreasespiroto 25 daily and addedlosartan 25 mg p.o. daily. Will follow No acute intervention  Discharge Instructions  Discharge Instructions    Ambulatory referral to Neurology   Complete by: As directed    An appointment is requested in approximately: 4 weeks   Ambulatory referral to Physical Medicine Rehab   Complete by: As directed    Seen by Dr. Carlis Abbott on consults   Diet - low sodium heart healthy   Complete by: As directed    Increase activity slowly   Complete by: As directed      Allergies as of 07/17/2019   No Known Allergies     Medication List    TAKE these medications   aspirin EC 81 MG tablet Take 81 mg by mouth daily.   atorvastatin 80 MG tablet Commonly known as: Lipitor Take 1 tablet (80 mg total) by mouth daily.   Calcium 167 MG Caps Take 1 capsule by mouth daily.   FLAX SEEDS PO Take 1 tablet by mouth daily.   losartan 25 MG tablet Commonly known as: COZAAR Take 1 tablet (25 mg total) by mouth daily. Start taking on: July 18, 2019   metoprolol succinate 50  MG 24 hr tablet Commonly known as: TOPROL-XL Take 1 tablet (50 mg total) by mouth daily. Take with or immediately following a meal. Start taking on: July 18, 2019   multivitamin with minerals Tabs tablet Take 1 tablet by mouth  daily.   SALMON OIL-1000 PO Take 1,000 mg by mouth daily.   spironolactone 50 MG tablet Commonly known as: ALDACTONE Take 0.5 tablets (25 mg total) by mouth daily. What changed: See the new instructions.   Ubiquinol 100 MG Caps Take 1 capsule by mouth daily.   vitamin B-12 1000 MCG tablet Commonly known as: CYANOCOBALAMIN Take 1,000 mcg by mouth daily.   vitamin C 500 MG tablet Commonly known as: ASCORBIC ACID Take 500 mg by mouth daily.   Vitamin D-3 125 MCG (5000 UT) Tabs Take 1 tablet by mouth every other day.   Zinc Lozg Take 1 lozenge by mouth daily as needed (For cold symptoms).      Follow-up Information    [name], DO Follow up in 4 week(s).   Specialty: Neurology Contact information: [address]  [address] [place] [address] [phone]        Jake Bathe, MD Follow up.   Specialty: Cardiology Why: You have a follow-up with Dr. Caro Hight on 08/24/2018 at 10:20am. Please arrive 15 minutes early for check-in. If this date/time does not work for you, please call our office to reschedule. Contact information: [address] [place] [address] [phone]          No Known Allergies  Consultations:  Cardiology and neurology   Procedures/Studies: Dg Chest 2 View  Result Date: 07/12/2019 CLINICAL DATA:  Altered mental status EXAM: CHEST - 2 VIEW COMPARISON:  None. FINDINGS: Heart is borderline in size. Lungs clear. No effusions. No acute bony abnormality. IMPRESSION: No active cardiopulmonary disease. Electronically Signed   By: Charlett Nose M.D.   On: 07/12/2019 19:52   Ct Head Wo Contrast  Result Date: 07/12/2019 CLINICAL DATA:  Patient with difficulty finding words. EXAM: CT HEAD WITHOUT CONTRAST TECHNIQUE: Contiguous axial images were obtained from the base of the skull through the vertex without intravenous contrast. COMPARISON:  MRI brain 01/17/2018 FINDINGS: Brain: Ventricles and sulci are prominent  compatible with atrophy. Extensive periventricular and subcortical white matter hypodensities compatible with chronic microvascular ischemic changes. No evidence for acute cortically based infarct, intracranial hemorrhage, mass lesion or mass-effect. Bilateral chronic basal ganglia lacunar infarcts. Vascular: Unremarkable Skull: Intact. Sinuses/Orbits: Polypoid mucosal thickening within the maxillary sinuses bilaterally. Mucosal thickening involving the ethmoid air cells. Mastoid air cells are unremarkable. Orbits are unremarkable. Other: None. IMPRESSION: No acute intracranial process. Atrophy and chronic microvascular ischemic changes. Electronically Signed   By: [name] M.D.   On: 07/12/2019 18:07   Mr Angio Head Wo Contrast  Result Date: 07/13/2019 CLINICAL DATA:  84 year old male with abnormal speech since yesterday. Brain MRI positive for acute left corona radiata and several additional acute to subacute bilateral cerebral infarcts with numerous chronic microhemorrhages and intracranial artery ectasia. EXAM: MRA HEAD WITHOUT CONTRAST TECHNIQUE: Angiographic images of the Circle of Willis were obtained using MRA technique without intravenous contrast. COMPARISON:  Brain MRI today reported separately. FINDINGS: Antegrade flow in the posterior circulation with dominant right vertebral artery. The left vertebral artery functionally terminates in PICA. The right PICA origin is also patent. Mild distal vertebral irregularity without stenosis. Mild to moderate irregularity and stenosis of the basilar artery, most pronounced in the proximal basilar  as seen on series 9, image 64 and series 1062, image 17. The basilar artery remains patent. Ectatic basilar tip with no discrete aneurysm. Fetal type left PCA origin. Right posterior communicating artery is diminutive or absent. Patent SCA origins. No significant right PCA stenosis. There is a severe left PCA P3 segment stenosis with preserved distal flow. Mild  superimposed multifocal left P2 segment irregularity and stenosis. Antegrade flow in both ICA siphons. Tortuous distal cervical ICAs. Bilateral siphon irregularity with mild stenosis greater on the left. Normal ophthalmic and left posterior communicating artery origins. Dominant right and diminutive or absent left ACA A1 segments. Ectatic right ICA terminus. Normal MCA and right ACA origins. Anterior communicating artery and tortuous bilateral ACA branches are within normal limits. Right MCA M1 segment and bifurcation are tortuous without stenosis. There is mild right MCA M3 branch irregularity. Left MCA M1 segment is tortuous and irregular with mild stenosis. The left MCA trifurcation is patent without stenosis. Visible left MCA branches are within normal limits. IMPRESSION: 1. 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 Stenoses are most pronounced in the Basilar Artery (Moderate) and Left PCA P3 segment (Severe). Mild stenosis elsewhere. No large vessel or intracranial branch occlusion identified. 2. Dominant right vertebral artery and right ACA A1 segment. Electronically Signed   By: Odessa Fleming M.D.   On: 07/13/2019 02:06   Mr Brain Wo Contrast  Result Date: 07/13/2019 CLINICAL DATA:  84 year old male with abnormal speech since yesterday. EXAM: MRI HEAD WITHOUT CONTRAST TECHNIQUE: Multiplanar, multiecho pulse sequences of the brain and surrounding structures were obtained without intravenous contrast. COMPARISON:  Plain head CT 07/12/2019. FINDINGS: Brain: Linear 10 millimeter area of restricted diffusion tracking from the left corona radiata to the posterior lentiform (series 7, image 55). There is also a small area of restricted diffusion in the left occipital lobe periventricular white matter on series 5, image 67. There is also a small linear area of contralateral right occipital lobe restricted diffusion on series 5, image 65 (and series 7, image 44). Furthermore there are  one or two punctate, subtle areas of abnormal white matter diffusion in the right frontal lobe (such as on series 5, image 77) which are not definitely restricted on ADC. Superimposed chronic lacunar infarcts in the bilateral cerebral white matter, bilateral deep gray nuclei, and occasionally in the cerebellum. Superimposed numerous scattered chronic microhemorrhages in the brain, mostly sparing the deep gray nuclei. No definite chronic cortical encephalomalacia. No midline shift, mass effect, evidence of mass lesion, ventriculomegaly, extra-axial collection or acute intracranial hemorrhage. Cervicomedullary junction and pituitary are within normal limits. Vascular: Major intracranial vascular flow voids are preserved. There is generalized intracranial artery ectasia. The right vertebral artery appears dominant. See also intracranial MRA reported separately. Skull and upper cervical spine: Negative for age visible cervical spine. Visualized bone marrow signal is within normal limits. Sinuses/Orbits: Negative orbits. Multiple maxillary sinus mucous retention cysts. Other: Mastoids are well pneumatized. Visible internal auditory structures appear normal. Scalp and face soft tissues appear negative. IMPRESSION: 1. Small acute lacunar infarct in the left corona radiata with no associated hemorrhage or mass effect. But several additional small acute to subacute infarcts in the bilateral occipital lobes and right frontal lobe. Favor synchronous small vessel disease (see #2, #3) over a recent embolic event. 2. Underlying advanced chronic small vessel ischemic disease. 3. Superimposed numerous chronic micro-hemorrhages in the brain, Amyloid Angiopathy is possible. 4. Generalized intracranial artery ectasia. See also intracranial MRA reported separately. Electronically Signed   By:  Odessa FlemingH  Hall M.D.   On: 07/13/2019 01:59   Vas [place] Carotid  Result Date: 07/14/2019 Carotid Arterial Duplex Study Indications:       TIA. Risk  Factors:      Hypertension. Comparison Study:  no prior Performing Technologist: Blanch MediaMegan Riddle RVS  Examination Guidelines: A complete evaluation includes B-mode imaging, spectral Doppler, color Doppler, and power Doppler as needed of all accessible portions of each vessel. Bilateral testing is considered an integral part of a complete examination. Limited examinations for reoccurring indications may be performed as noted.  Right Carotid Findings: +----------+--------+--------+--------+------------------+--------+           PSV cm/sEDV cm/sStenosisPlaque DescriptionComments +----------+--------+--------+--------+------------------+--------+ CCA Prox  77      4               heterogenous               +----------+--------+--------+--------+------------------+--------+ CCA Distal80      8               heterogenous               +----------+--------+--------+--------+------------------+--------+ ICA Prox  87      10      1-39%   heterogenous               +----------+--------+--------+--------+------------------+--------+ ICA Distal152     12                                         +----------+--------+--------+--------+------------------+--------+ ECA       174                                                +----------+--------+--------+--------+------------------+--------+ +----------+--------+-------+--------+-------------------+           PSV cm/sEDV cmsDescribeArm Pressure (mmHG) +----------+--------+-------+--------+-------------------+ Subclavian150                                        +----------+--------+-------+--------+-------------------+ +---------+--------+--+--------+-+---------+ VertebralPSV cm/s53EDV cm/s4Antegrade +---------+--------+--+--------+-+---------+  Left Carotid Findings: +----------+--------+--------+--------+------------------+--------+           PSV cm/sEDV cm/sStenosisPlaque DescriptionComments  +----------+--------+--------+--------+------------------+--------+ CCA Prox  106                     heterogenous               +----------+--------+--------+--------+------------------+--------+ CCA Distal69                      heterogenous               +----------+--------+--------+--------+------------------+--------+ ICA Prox  108     18      1-39%   heterogenous               +----------+--------+--------+--------+------------------+--------+ ICA Distal62      8                                          +----------+--------+--------+--------+------------------+--------+ ECA       138                                                +----------+--------+--------+--------+------------------+--------+ +----------+--------+--------+--------+-------------------+  PSV cm/sEDV cm/sDescribeArm Pressure (mmHG) +----------+--------+--------+--------+-------------------+ Subclavian120                                         +----------+--------+--------+--------+-------------------+ +---------+--------+--+--------+---------+ VertebralPSV cm/s49EDV cm/sAntegrade +---------+--------+--+--------+---------+  Summary: Right Carotid: Velocities in the right ICA are consistent with a 1-39% stenosis. Left Carotid: Velocities in the left ICA are consistent with a 1-39% stenosis. Vertebrals: Bilateral vertebral arteries demonstrate antegrade flow. *See table(s) above for measurements and observations.  Electronically signed by Delia Heady MD on 07/14/2019 at 8:12:32 AM.    Final     (Echo, Carotid, EGD, Colonoscopy, ERCP)    Subjective: Patient denies any acute complaints today.  He is pending discharge to inpatient rehab later today.  Discharge Exam: Vitals:   07/16/19 2347 07/17/19 0550  BP: (!) 160/51 (!) 149/80  Pulse: (!) 57 (!) 55  Resp: 16 16  Temp: 97.9 F (36.6 C) (!) 97.5 F (36.4 C)  SpO2: 100% 97%   Vitals:   07/16/19 1225 07/16/19 1824  07/16/19 2347 07/17/19 0550  BP: (!) 165/60 (!) 150/50 (!) 160/51 (!) 149/80  Pulse: 80 78 (!) 57 (!) 55  Resp: Temp: 98.3 F (36.8 C) 98.1 F (36.7 C) 97.9 F (36.6 C) (!) 97.5 F (36.4 C)  TempSrc: Oral Oral Oral Oral  SpO2: 95% 96% 100% 97%  Weight:      Height:        General exam:Appears calm and comfortable  Respiratory system: Clear to auscultation. Respiratory effort normal. Cardiovascular system:Normal sinus rhythm, 2/6 systolic ejection murmur  gastrointestinal system:Abdomen is nondistended, soft and nontender. No organomegaly or masses felt. Normal bowel sounds heard. Central nervous system:Alert, persistent intermittent word finding difficulty Extremities:Warm well perfused, neurovascular intact Skin: No rashes, lesions or ulcers Psychiatry:Judgement and insight. Impaired-SOME MILD BASELINE CONFUSION     The results of significant diagnostics from this hospitalization (including imaging, microbiology, ancillary and laboratory) are listed below for reference.     Microbiology: Recent Results (from the past 240 hour(s))  SARS CORONAVIRUS 2 (TAT 6-24 HRS) Nasopharyngeal Nasopharyngeal Swab     Status: None   Collection Time: 07/12/19  7:19 PM   Specimen: Nasopharyngeal Swab  Result Value Ref Range Status   SARS Coronavirus 2 NEGATIVE NEGATIVE Final    Comment: (NOTE) SARS-CoV-2 target nucleic acids are NOT DETECTED. The SARS-CoV-2 RNA is generally detectable in upper and lower respiratory specimens during the acute phase of infection. Negative results do not preclude SARS-CoV-2 infection, do not rule out co-infections with other pathogens, and should not be used as the sole basis for treatment or other patient management decisions. Negative results must be combined with clinical observations, patient history, and epidemiological information. The expected result is Negative. Fact Sheet for  Patients: HairSlick.no Fact Sheet for Healthcare Providers: quierodirigir.com This test is not yet approved or cleared by the [place] FDA and  has been authorized for detection and/or diagnosis of SARS-CoV-2 by FDA under an Emergency Use Authorization (EUA). This EUA will remain  in effect (meaning this test can be used) for the duration of the COVID-19 declaration under Section 56 4(b)(1) of the Act, 21 U.S.C. section 360bbb-3(b)(1), unless the authorization is terminated or revoked sooner. Performed at [facility], [address], [place], [place] [address]   Culture, Urine     Status: None (Preliminary result)   Collection Time: 07/16/19  2:09 PM   Specimen: Urine, Catheterized  Result Value Ref Range Status   Specimen Description URINE, CATHETERIZED  Final   Special Requests NONE  Final   Culture   Final    CULTURE REINCUBATED FOR BETTER GROWTH Performed at [facility], [address] [place], [place] [id number]    Report Status PENDING  Incomplete     Labs: BNP (last 3 results) No results for input(s): BNP in the last 8760 hours. Basic Metabolic Panel: Recent Labs  Lab 07/13/19 0500 07/14/19 0626 07/15/19 0541 07/16/19 0531 07/17/19 0557  NA 141 139 141 142 142  K 3.3* 3.1* 3.5 3.5 3.6  CL 106 106 104 105 110  CO2 26 23 23 24 22   GLUCOSE 115* 151* 135* 130* 125*  BUN 16 22 27* 34* 45*  CREATININE 1.17 1.04 1.21 1.32* 1.19  CALCIUM 8.9 8.8* 9.0 9.0 8.9   Liver Function Tests: Recent Labs  Lab 07/12/19 1712 07/13/19 0500  AST 17 15  ALT 13 13  ALKPHOS 68 67  BILITOT 1.2 1.3*  PROT 6.1* 6.4*  ALBUMIN 3.5 3.6   No results for input(s): LIPASE, AMYLASE in the last 168 hours. No results for input(s): AMMONIA in the last 168 hours. CBC: Recent Labs  Lab 07/12/19 1712 07/13/19 0500 07/14/19 0626 07/15/19 0541 07/16/19 0531 07/17/19 0557  WBC 6.3 7.0 9.5 8.7 10.6* 10.7*   NEUTROABS 3.8  --  7.5 6.1 7.6 7.8*  HGB 13.8 14.8 15.3 15.5 15.5 15.2  HCT 39.7 42.3 43.6 44.4 44.9 44.5  MCV 87.3 88.1 87.0 87.1 87.5 88.6  PLT 158 161 195 174 193 172   Cardiac Enzymes: No results for input(s): CKTOTAL, CKMB, CKMBINDEX, TROPONINI in the last 168 hours. BNP: Invalid input(s): POCBNP CBG: No results for input(s): GLUCAP in the last 168 hours. D-Dimer No results for input(s): DDIMER in the last 72 hours. Hgb A1c No results for input(s): HGBA1C in the last 72 hours. Lipid Profile No results for input(s): CHOL, HDL, LDLCALC, TRIG, CHOLHDL, LDLDIRECT in the last 72 hours. Thyroid function studies No results for input(s): TSH, T4TOTAL, T3FREE, THYROIDAB in the last 72 hours.  Invalid input(s): FREET3 Anemia work up No results for input(s): VITAMINB12, FOLATE, FERRITIN, TIBC, IRON, RETICCTPCT in the last 72 hours. Urinalysis    Component Value Date/Time   COLORURINE YELLOW 07/16/2019 0021   APPEARANCEUR CLEAR 07/16/2019 0021   LABSPEC 1.028 07/16/2019 0021   PHURINE 5.0 07/16/2019 0021   GLUCOSEU NEGATIVE 07/16/2019 0021   HGBUR NEGATIVE 07/16/2019 0021   BILIRUBINUR NEGATIVE 07/16/2019 0021   KETONESUR NEGATIVE 07/16/2019 0021   PROTEINUR 30 (A) 07/16/2019 0021   NITRITE NEGATIVE 07/16/2019 0021   LEUKOCYTESUR TRACE (A) 07/16/2019 0021   Sepsis Labs Invalid input(s): PROCALCITONIN,  WBC,  LACTICIDVEN Microbiology Recent Results (from the past 240 hour(s))  SARS CORONAVIRUS 2 (TAT 6-24 HRS) Nasopharyngeal Nasopharyngeal Swab     Status: None   Collection Time: 07/12/19  7:19 PM   Specimen: Nasopharyngeal Swab  Result Value Ref Range Status   SARS Coronavirus 2 NEGATIVE NEGATIVE Final    Comment: (NOTE) SARS-CoV-2 target nucleic acids are NOT DETECTED. The SARS-CoV-2 RNA is generally detectable in upper and lower respiratory specimens during the acute phase of infection. Negative results do not preclude SARS-CoV-2 infection, do not rule  out co-infections with other pathogens, and should not be used as the sole basis for treatment or other patient management decisions. Negative results must be combined with clinical observations,  patient history, and epidemiological information. The expected result is Negative. Fact Sheet for Patients: SugarRoll.be Fact Sheet for Healthcare Providers: https://www.woods-mathews.com/ This test is not yet approved or cleared by the [place] FDA and  has been authorized for detection and/or diagnosis of SARS-CoV-2 by FDA under an Emergency Use Authorization (EUA). This EUA will remain  in effect (meaning this test can be used) for the duration of the COVID-19 declaration under Section 56 4(b)(1) of the Act, 21 U.S.C. section 360bbb-3(b)(1), unless the authorization is terminated or revoked sooner. Performed at [facility], [place] [address], [place], [place] [address]   Culture, Urine     Status: None (Preliminary result)   Collection Time: 07/16/19  2:09 PM   Specimen: Urine, Catheterized  Result Value Ref Range Status   Specimen Description URINE, CATHETERIZED  Final   Special Requests NONE  Final   Culture   Final    CULTURE REINCUBATED FOR BETTER GROWTH Performed at [facility], [place] [address], [place], [place] [address]    Report Status PENDING  Incomplete     Time coordinating discharge: Over 30 minutes  SIGNED:   Charolotte Capuchin, MD  [facility] 07/17/2019, 2:46 PM   If 7PM-7AM, please contact night-coverage [email or web address] Password [email or web address]

## 2019-07-17 NOTE — Progress Notes (Signed)
Nicholas Chin, MD  Physician  Physical Medicine and Rehabilitation  Consult Note  Signed  Date of Service:  07/13/2019  9:36 AM      Related encounter: ED to Hosp-Admission (Discharged) from 07/12/2019 in [facility] OBSERVATION UNIT      Signed      Expand All Collapse All            Physical Medicine and Rehabilitation Consult Reason for Consult: Decreased functional mobility Referring Physician: [facility]     HPI: Nicholas Hancock is a 84 y.o. right-handed male with history of hypertension, adrenal gland dysfunction, prior history of TIA maintained on aspirin, remote tobacco abuse.  Per chart review patient lives with spouse.  Independent with assistive device prior to admission.  1 level home.  Presented 07/12/2019 with aphasia and leaning to the right.  Cranial CT scan negative.  Patient did not receive TPA.  MRI of the brain showed small acute lacunar infarct in the left corona radiata with no associated hemorrhage or mass-effect.  Several additional small acute to subacute infarcts in the bilateral occipital lobes and right frontal lobe.  MRA with no large vessel occlusion.  Echocardiogram is pending.  Admission chemistries unremarkable.  Urine drug screen negative.  SARS coronavirus negative.  Neurology follow-up maintained on aspirin for CVA prophylaxis.  Tolerating a regular diet.  Therapy evaluations completed recommendations of physical medicine rehab consult.     Review of Systems  Constitutional: Negative for chills and fever.  HENT: Negative for hearing loss.   Eyes: Negative for blurred vision and double vision.  Respiratory: Negative for shortness of breath.   Cardiovascular: Negative for chest pain, palpitations and leg swelling.  Gastrointestinal: Positive for constipation. Negative for heartburn, nausea and vomiting.  Genitourinary: Positive for urgency. Negative for dysuria, flank pain and hematuria.  Musculoskeletal: Positive for joint pain and  myalgias.  Skin: Negative for rash.  Neurological: Positive for speech change and weakness.  All other systems reviewed and are negative.       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 Earlene Plater   Hypertension      takes diuretic for adrenal gland dysfunction, BP will be high without diuretic   Vitamin D deficiency           Past Surgical History:  Procedure Laterality Date   HAND SURGERY   09/21/1987    Enlarged mass on right palm overlying right long finger metacarpophalangeal joint removed   INGUINAL HERNIA REPAIR   07/15/2012    Procedure: LAPAROSCOPIC BILATERAL INGUINAL HERNIA REPAIR;  Surgeon: Valarie Merino, MD;  Location: [facility];  Service: General;  Laterality: Bilateral;  Laparoscopic Bilateral Inguinal Hernia Repairs with Mesh   left knee arthroscopy   3 years ago   PELVIC FRACTURE SURGERY        chippped in high school   TONSILLECTOMY AND ADENOIDECTOMY             Family History  Problem Relation Age of Onset   Stroke Father     Diabetes Father          `   Cancer Father  unknown   Heart disease Mother     Cancer Mother          unknown    Social History:  reports that he quit smoking about 34 years ago. His smoking use included pipe. He quit after 20.00 years of use. He has never used smokeless tobacco. He reports current alcohol use. He reports that he does not use drugs. Allergies: No Known Allergies       Medications Prior to Admission  Medication Sig Dispense Refill   aspirin EC 81 MG tablet Take 81 mg by mouth daily.       atorvastatin (LIPITOR) 80 MG tablet Take 1 tablet (80 mg total) by mouth daily. 30 tablet 5   Calcium 167 MG CAPS Take 1 capsule by mouth daily.        Cholecalciferol (VITAMIN D-3) 5000 UNITS TABS Take 1 tablet by mouth every other day.       Flaxseed, Linseed, (FLAX SEEDS PO) Take 1 tablet by mouth daily.       Multiple Vitamin (MULTIVITAMIN WITH MINERALS) TABS Take 1 tablet by mouth daily.       Omega-3 Fatty Acids (SALMON OIL-1000 PO) Take 1,000 mg by mouth daily.       spironolactone (ALDACTONE) 50 MG tablet TAKE 1 TABLET BY MOUTH EVERY DAY BEFORE BREAKFAST 90 tablet 3   Ubiquinol 100 MG CAPS Take 1 capsule by mouth daily.       vitamin B-12 (CYANOCOBALAMIN) 1000 MCG tablet Take 1,000 mcg by mouth daily.       vitamin C (ASCORBIC ACID) 500 MG tablet Take 500 mg by mouth daily.          Home: Home Living Family/patient expects to be discharged to:: Private residence Living Arrangements: Spouse/significant other Available Help at Discharge: Family, Available 24 hours/day(spouse but unsure about level assist she can provide) Type of Home: House Home Access: Level entry Home Layout: One level Bathroom Shower/Tub: Chiropodist: Standard Home Equipment: Environmental consultant - 2 wheels, Cane - single point, Grab bars - toilet, Grab bars - tub/shower Additional Comments: unsure of patients reliability  Functional History: Prior Function Level of Independence: Independent with assistive device(s) Comments: uses RW, reports wife drives and does grocery shopping. Unsure of accuracy of PLOF report Functional Status:  Mobility: Bed Mobility Overal bed mobility: Needs Assistance Bed Mobility: Supine to Sit Supine to sit: Min guard General bed mobility comments: HOB flat, increased time, labored effort, max verbal cues to to scoot to EOB so feet are on the floor Transfers Overall transfer level: Needs assistance Equipment used: Rolling walker (2 wheeled) Transfers: Sit to/from Stand Sit to Stand: Min assist General transfer comment: max directional verbal cues, verbal cues for safe hand placement and not to pull up on  walker, minA to steady upon standing during transition of hands from bed to RW Ambulation/Gait Ambulation/Gait assistance: Min assist Gait Distance (Feet): 30 Feet(limited by urinary incontinence) Assistive device: Rolling walker (2 wheeled) Gait Pattern/deviations: Step-through pattern, Decreased stride length, Trunk flexed General Gait Details: max verbal cues to stay in walker, upon exiting room pt with urinary incontinence and was then re-directed to the bathroom Gait velocity: slow   ADL:  To be determined   Cognition: Cognition Overall Cognitive Status: Impaired/Different from baseline(unable to get a hold of spouse to ask PLOF) Orientation Level: Oriented to person, Oriented to place, Oriented to situation, Disoriented to time Cognition Arousal/Alertness: Awake/alert Behavior During Therapy: Impulsive Overall Cognitive Status: Impaired/Different from baseline(unable  to get a hold of spouse to ask PLOF) Area of Impairment: Orientation, Attention, Memory, Following commands, Safety/judgement, Awareness, Problem solving Orientation Level: Disoriented to, Place, Time, Situation(said hospital but unable to state [facility]) Current Attention Level: Focused Memory: Decreased short-term memory Following Commands: Follows one step commands with increased time Safety/Judgement: Decreased awareness of safety, Decreased awareness of deficits Awareness: Intellectual(pt with urinary incontence) Problem Solving: Slow processing, Decreased initiation, Difficulty sequencing, Requires verbal cues, Requires tactile cues General Comments: asked pt several times if he needed to use the bathroom, pt declined and then once he amb into hallway pt with urinary incontinence. pt very impulsive with decreased safety awareness and insight to deficits.    Blood pressure (!) 210/87, pulse 66, temperature 97.6 F (36.4 C), temperature source Oral, resp. rate 16, height  (1.727 m), weight 72.6 kg, SpO2 98 %.     Physical Exam  Gen: no distress, normal appearing HEENT: oral mucosa pink and moist, NCAT Cardio: Reg rate Chest: normal effort, normal rate of breathing Abd: soft, non-distended Ext: no edema Skin: intact Psych: pleasant, normal affect Neurological:  Patient is alert.  Mild aphasia.  Follows simple commands.  Had some difficulty with naming and repetition.  Impaired finger to nose bilaterally--able to perform with increased time. Slightly slower on left side.  CN 2-12 intact Sensation intact. Musculoskeletal: 5/5 strength throughout.   Lab Results Last 24 Hours       Results for orders placed or performed during the hospital encounter of 07/12/19 (from the past 24 hour(s))  Ethanol     Status: None    Collection Time: 07/12/19  5:12 PM  Result Value Ref Range    Alcohol, Ethyl (B) <10 <10 mg/dL  Protime-INR     Status: None    Collection Time: 07/12/19  5:12 PM  Result Value Ref Range    Prothrombin Time 14.4 11.4 - 15.2 seconds    INR 1.1 0.8 - 1.2  APTT     Status: None    Collection Time: 07/12/19  5:12 PM  Result Value Ref Range    aPTT 28 24 - 36 seconds  CBC     Status: None    Collection Time: 07/12/19  5:12 PM  Result Value Ref Range    WBC 6.3 4.0 - 10.5 K/uL    RBC 4.55 4.22 - 5.81 MIL/uL    Hemoglobin 13.8 13.0 - 17.0 g/dL    HCT 16.1 09.6 - 04.5 %    MCV 87.3 80.0 - 100.0 fL    MCH 30.3 26.0 - 34.0 pg    MCHC 34.8 30.0 - 36.0 g/dL    RDW 40.9 81.1 - 91.4 %    Platelets 158 150 - 400 K/uL    nRBC 0.0 0.0 - 0.2 %  Differential     Status: None    Collection Time: 07/12/19  5:12 PM  Result Value Ref Range    Neutrophils Relative % 61 %    Neutro Abs 3.8 1.7 - 7.7 K/uL    Lymphocytes Relative 26 %    Lymphs Abs 1.6 0.7 - 4.0 K/uL    Monocytes Relative 9 %    Monocytes Absolute 0.5 0.1 - 1.0 K/uL    Eosinophils Relative 4 %    Eosinophils Absolute 0.3 0.0 - 0.5 K/uL    Basophils Relative 0 %    Basophils Absolute 0.0 0.0 - 0.1 K/uL    Immature  Granulocytes 0 %    Abs Immature Granulocytes  0.02 0.00 - 0.07 K/uL  Comprehensive metabolic panel     Status: Abnormal    Collection Time: 07/12/19  5:12 PM  Result Value Ref Range    Sodium 139 135 - 145 mmol/L    Potassium 3.5 3.5 - 5.1 mmol/L    Chloride 103 98 - 111 mmol/L    CO2 26 22 - 32 mmol/L    Glucose, Bld 146 (H) 70 - 99 mg/dL    BUN 19 8 - 23 mg/dL    Creatinine, Ser 1.611.20 0.61 - 1.24 mg/dL    Calcium 8.8 (L) 8.9 - 10.3 mg/dL    Total Protein 6.1 (L) 6.5 - 8.1 g/dL    Albumin 3.5 3.5 - 5.0 g/dL    AST 17 15 - 41 U/L    ALT 13 0 - 44 U/L    Alkaline Phosphatase 68 38 - 126 U/L    Total Bilirubin 1.2 0.3 - 1.2 mg/dL    GFR calc non Af Amer 56 (L) >60 mL/min    GFR calc Af Amer >60 >60 mL/min    Anion gap 10 5 - 15  SARS CORONAVIRUS 2 (TAT 6-24 HRS) Nasopharyngeal Nasopharyngeal Swab     Status: None    Collection Time: 07/12/19  7:19 PM    Specimen: Nasopharyngeal Swab  Result Value Ref Range    SARS Coronavirus 2 NEGATIVE NEGATIVE  Urine rapid drug screen (hosp performed)     Status: None    Collection Time: 07/12/19  7:23 PM  Result Value Ref Range    Opiates NONE DETECTED NONE DETECTED    Cocaine NONE DETECTED NONE DETECTED    Benzodiazepines NONE DETECTED NONE DETECTED    Amphetamines NONE DETECTED NONE DETECTED    Tetrahydrocannabinol NONE DETECTED NONE DETECTED    Barbiturates NONE DETECTED NONE DETECTED  Urinalysis, Routine w reflex microscopic     Status: Abnormal    Collection Time: 07/12/19  7:23 PM  Result Value Ref Range    Color, Urine STRAW (A) YELLOW    APPearance CLEAR CLEAR    Specific Gravity, Urine 1.004 (L) 1.005 - 1.030    pH 7.0 5.0 - 8.0    Glucose, UA NEGATIVE NEGATIVE mg/dL    Hgb urine dipstick NEGATIVE NEGATIVE    Bilirubin Urine NEGATIVE NEGATIVE    Ketones, ur NEGATIVE NEGATIVE mg/dL    Protein, ur NEGATIVE NEGATIVE mg/dL    Nitrite NEGATIVE NEGATIVE    Leukocytes,Ua NEGATIVE NEGATIVE  Hemoglobin A1c     Status: Abnormal     Collection Time: 07/13/19  5:00 AM  Result Value Ref Range    Hgb A1c MFr Bld 5.9 (H) 4.8 - 5.6 %    Mean Plasma Glucose 122.63 mg/dL  CBC     Status: None    Collection Time: 07/13/19  5:00 AM  Result Value Ref Range    WBC 7.0 4.0 - 10.5 K/uL    RBC 4.80 4.22 - 5.81 MIL/uL    Hemoglobin 14.8 13.0 - 17.0 g/dL    HCT 09.642.3 04.539.0 - 40.952.0 %    MCV 88.1 80.0 - 100.0 fL    MCH 30.8 26.0 - 34.0 pg    MCHC 35.0 30.0 - 36.0 g/dL    RDW 81.112.7 91.411.5 - 78.215.5 %    Platelets 161 150 - 400 K/uL    nRBC 0.0 0.0 - 0.2 %  Comprehensive metabolic panel     Status: Abnormal    Collection Time: 07/13/19  5:00 AM  Result Value Ref Range    Sodium 141 135 - 145 mmol/L    Potassium 3.3 (L) 3.5 - 5.1 mmol/L    Chloride 106 98 - 111 mmol/L    CO2 26 22 - 32 mmol/L    Glucose, Bld 115 (H) 70 - 99 mg/dL    BUN 16 8 - 23 mg/dL    Creatinine, Ser 1.61 0.61 - 1.24 mg/dL    Calcium 8.9 8.9 - 09.6 mg/dL    Total Protein 6.4 (L) 6.5 - 8.1 g/dL    Albumin 3.6 3.5 - 5.0 g/dL    AST 15 15 - 41 U/L    ALT 13 0 - 44 U/L    Alkaline Phosphatase 67 38 - 126 U/L    Total Bilirubin 1.3 (H) 0.3 - 1.2 mg/dL    GFR calc non Af Amer 58 (L) >60 mL/min    GFR calc Af Amer >60 >60 mL/min    Anion gap 9 5 - 15  Lipid panel     Status: Abnormal    Collection Time: 07/13/19  5:00 AM  Result Value Ref Range    Cholesterol 226 (H) 0 - 200 mg/dL    Triglycerides 79 <045 mg/dL    HDL 31 (L) >40 mg/dL    Total CHOL/HDL Ratio 7.3 RATIO    VLDL 16 0 - 40 mg/dL    LDL Cholesterol 981 (H) 0 - 99 mg/dL      Imaging Results (Last 48 hours)  Dg Chest 2 View   Result Date: 07/12/2019 CLINICAL DATA:  Altered mental status EXAM: CHEST - 2 VIEW COMPARISON:  None. FINDINGS: Heart is borderline in size. Lungs clear. No effusions. No acute bony abnormality. IMPRESSION: No active cardiopulmonary disease. Electronically Signed   By: Charlett Nose M.D.   On: 07/12/2019 19:52    Ct Head Wo Contrast   Result Date: 07/12/2019 CLINICAL DATA:   Patient with difficulty finding words. EXAM: CT HEAD WITHOUT CONTRAST TECHNIQUE: Contiguous axial images were obtained from the base of the skull through the vertex without intravenous contrast. COMPARISON:  MRI brain 01/17/2018 FINDINGS: Brain: Ventricles and sulci are prominent compatible with atrophy. Extensive periventricular and subcortical white matter hypodensities compatible with chronic microvascular ischemic changes. No evidence for acute cortically based infarct, intracranial hemorrhage, mass lesion or mass-effect. Bilateral chronic basal ganglia lacunar infarcts. Vascular: Unremarkable Skull: Intact. Sinuses/Orbits: Polypoid mucosal thickening within the maxillary sinuses bilaterally. Mucosal thickening involving the ethmoid air cells. Mastoid air cells are unremarkable. Orbits are unremarkable. Other: None. IMPRESSION: No acute intracranial process. Atrophy and chronic microvascular ischemic changes. Electronically Signed   By: [name] M.D.   On: 07/12/2019 18:07    Mr Angio Head Wo Contrast   Result Date: 07/13/2019 CLINICAL DATA:  84 year old male with abnormal speech since yesterday. Brain MRI positive for acute left corona radiata and several additional acute to subacute bilateral cerebral infarcts with numerous chronic microhemorrhages and intracranial artery ectasia. EXAM: MRA HEAD WITHOUT CONTRAST TECHNIQUE: Angiographic images of the Circle of Willis were obtained using MRA technique without intravenous contrast. COMPARISON:  Brain MRI today reported separately. FINDINGS: Antegrade flow in the posterior circulation with dominant right vertebral artery. The left vertebral artery functionally terminates in PICA. The right PICA origin is also patent. Mild distal vertebral irregularity without stenosis. Mild to moderate irregularity and stenosis of the basilar artery, most pronounced in the proximal basilar as seen on series 9, image 64 and series 1062, image 17. The basilar artery remains  patent. Ectatic basilar tip with no discrete aneurysm. Fetal type left PCA origin. Right posterior communicating artery is diminutive or absent. Patent SCA origins. No significant right PCA stenosis. There is a severe left PCA P3 segment stenosis with preserved distal flow. Mild superimposed multifocal left P2 segment irregularity and stenosis. Antegrade flow in both ICA siphons. Tortuous distal cervical ICAs. Bilateral siphon irregularity with mild stenosis greater on the left. Normal ophthalmic and left posterior communicating artery origins. Dominant right and diminutive or absent left ACA A1 segments. Ectatic right ICA terminus. Normal MCA and right ACA origins. Anterior communicating artery and tortuous bilateral ACA branches are within normal limits. Right MCA M1 segment and bifurcation are tortuous without stenosis. There is mild right MCA M3 branch irregularity. Left MCA M1 segment is tortuous and irregular with mild stenosis. The left MCA trifurcation is patent without stenosis. Visible left MCA branches are within normal limits. IMPRESSION: 1. 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 Stenoses are most pronounced in the Basilar Artery (Moderate) and Left PCA P3 segment (Severe). Mild stenosis elsewhere. No large vessel or intracranial branch occlusion identified. 2. Dominant right vertebral artery and right ACA A1 segment. Electronically Signed   By: Odessa FlemingH  Hall M.D.   On: 07/13/2019 02:06    Mr Brain Wo Contrast   Result Date: 07/13/2019 CLINICAL DATA:  84 year old male with abnormal speech since yesterday. EXAM: MRI HEAD WITHOUT CONTRAST TECHNIQUE: Multiplanar, multiecho pulse sequences of the brain and surrounding structures were obtained without intravenous contrast. COMPARISON:  Plain head CT 07/12/2019. FINDINGS: Brain: Linear 10 millimeter area of restricted diffusion tracking from the left corona radiata to the posterior lentiform (series 7, image 55). There is also  a small area of restricted diffusion in the left occipital lobe periventricular white matter on series 5, image 67. There is also a small linear area of contralateral right occipital lobe restricted diffusion on series 5, image 65 (and series 7, image 44). Furthermore there are one or two punctate, subtle areas of abnormal white matter diffusion in the right frontal lobe (such as on series 5, image 77) which are not definitely restricted on ADC. Superimposed chronic lacunar infarcts in the bilateral cerebral white matter, bilateral deep gray nuclei, and occasionally in the cerebellum. Superimposed numerous scattered chronic microhemorrhages in the brain, mostly sparing the deep gray nuclei. No definite chronic cortical encephalomalacia. No midline shift, mass effect, evidence of mass lesion, ventriculomegaly, extra-axial collection or acute intracranial hemorrhage. Cervicomedullary junction and pituitary are within normal limits. Vascular: Major intracranial vascular flow voids are preserved. There is generalized intracranial artery ectasia. The right vertebral artery appears dominant. See also intracranial MRA reported separately. Skull and upper cervical spine: Negative for age visible cervical spine. Visualized bone marrow signal is within normal limits. Sinuses/Orbits: Negative orbits. Multiple maxillary sinus mucous retention cysts. Other: Mastoids are well pneumatized. Visible internal auditory structures appear normal. Scalp and face soft tissues appear negative. IMPRESSION: 1. Small acute lacunar infarct in the left corona radiata with no associated hemorrhage or mass effect. But several additional small acute to subacute infarcts in the bilateral occipital lobes and right frontal lobe. Favor synchronous small vessel disease (see #2, #3) over a recent embolic event. 2. Underlying advanced chronic small vessel ischemic disease. 3. Superimposed numerous chronic micro-hemorrhages in the brain, Amyloid  Angiopathy is possible. 4. Generalized intracranial artery ectasia. See also intracranial MRA reported separately. Electronically Signed   By: Odessa FlemingH  Hall M.D.   On: 07/13/2019 01:59    Vas [place]  Carotid   Result Date: 07/13/2019 Carotid Arterial Duplex Study Indications:       TIA. Risk Factors:      Hypertension. Comparison Study:  no prior Performing Technologist: [name] RVS  Examination Guidelines: A complete evaluation includes B-mode imaging, spectral Doppler, color Doppler, and power Doppler as needed of all accessible portions of each vessel. Bilateral testing is considered an integral part of a complete examination. Limited examinations for reoccurring indications may be performed as noted.  Right Carotid Findings: +----------+--------+--------+--------+------------------+--------+             PSV cm/s EDV cm/s Stenosis Plaque Description Comments  +----------+--------+--------+--------+------------------+--------+  CCA Prox   77       4                 heterogenous                 +----------+--------+--------+--------+------------------+--------+  CCA Distal 80       8                 heterogenous                 +----------+--------+--------+--------+------------------+--------+  ICA Prox   87       10       1-39%    heterogenous                 +----------+--------+--------+--------+------------------+--------+  ICA Distal 152      12                                             +----------+--------+--------+--------+------------------+--------+  ECA        174                                                     +----------+--------+--------+--------+------------------+--------+ +----------+--------+-------+--------+-------------------+             PSV cm/s EDV cms Describe Arm Pressure (mmHG)  +----------+--------+-------+--------+-------------------+  Subclavian 150                                            +----------+--------+-------+--------+-------------------+  +---------+--------+--+--------+-+---------+  Vertebral PSV cm/s 53 EDV cm/s 4 Antegrade  +---------+--------+--+--------+-+---------+  Left Carotid Findings: +----------+--------+--------+--------+------------------+--------+             PSV cm/s EDV cm/s Stenosis Plaque Description Comments  +----------+--------+--------+--------+------------------+--------+  CCA Prox   106                                                     +----------+--------+--------+--------+------------------+--------+  CCA Distal 69                         heterogenous                 +----------+--------+--------+--------+------------------+--------+  ICA Prox   108      18       1-39%    heterogenous                 +----------+--------+--------+--------+------------------+--------+  ICA Distal 62       8                                              +----------+--------+--------+--------+------------------+--------+  ECA        138                                                     +----------+--------+--------+--------+------------------+--------+ +----------+--------+--------+--------+-------------------+             PSV cm/s EDV cm/s Describe Arm Pressure (mmHG)  +----------+--------+--------+--------+-------------------+  Subclavian 120                                             +----------+--------+--------+--------+-------------------+ +---------+--------+--+--------+---------+  Vertebral PSV cm/s 49 EDV cm/s Antegrade  +---------+--------+--+--------+---------+  Summary: Right Carotid: Velocities in the right ICA are consistent with a 1-39% stenosis. Left Carotid: Velocities in the left ICA are consistent with a 1-39% stenosis. Vertebrals: Bilateral vertebral arteries demonstrate antegrade flow. *See table(s) above for measurements and observations.     Preliminary         Assessment/Plan: Diagnosis: Impaired gait and mobility secondary to acute left corona radiata lacunar infarct 1. Does the need for close, 24 hr/day  medical supervision in concert with the patient's rehab needs make it unreasonable for this patient to be served in a less intensive setting? Yes 2. Co-Morbidities requiring supervision/potential complications: HTN, first degree AV block, aortic regurgitation 3. Due to bladder management, bowel management, safety, skin/wound care, disease management, medication administration and patient education, does the patient require 24 hr/day rehab nursing? Yes 4. Does the patient require coordinated care of a physician, rehab nurse, therapy disciplines of PT, OT, SLP to address physical and functional deficits in the context of the above medical diagnosis(es)? Yes Addressing deficits in the following areas: balance, endurance, locomotion, strength, transferring, bowel/bladder control, bathing, dressing, feeding, grooming, toileting, cognition, speech, language and psychosocial support 5. Can the patient actively participate in an intensive therapy program of at least 3 hrs of therapy per day at least 5 days per week? Yes 6. The potential for patient to make measurable gains while on inpatient rehab is excellent 7. Anticipated functional outcomes upon discharge from inpatient rehab are modified independent  with PT, modified independent with OT, modified independent with SLP. 8. Estimated rehab length of stay to reach the above functional goals is: 10-14 days 9. Anticipated discharge destination: Home 10. Overall Rehab/Functional Prognosis: excellent   RECOMMENDATIONS: This patient's condition is appropriate for continued rehabilitative care in the following setting: CIR Patient has agreed to participate in recommended program. Yes Note that insurance prior authorization may be required for reimbursement for recommended care.   Comment: Mr. Osmond would be an excellent candidate for rehabilitation once medically stable and his HR is better controlled. He will be able to tolerate 3 hours of daily therapy. He  is high functioning at home and walks daily with his wife and our goal will be to return him as much as possible to his baseline function and to maximize his safety and independence. His wife reports frequent falls  and he would benefit from increased focus on balance and caregiver training regarding fall prevention. Thank you for this consult. We will continue to follow in Mr. Lao's care.   I have personally performed a face to face diagnostic evaluation, including, but not limited to relevant history and physical exam findings, of this patient and developed relevant assessment and plan.  Additionally, I have reviewed and concur with the physician assistant's documentation above.   Nicholas Chin, MD 07/13/2019        Revision History Date/Time User Provider Type Action  07/13/2019 12:12 PM Carlis Abbott Drema Pry, MD Physician Sign  07/13/2019  9:43 AM Angiulli, Mcarthur Rossetti, PA-C Physician Assistant Pend   View Details Report     Routing History

## 2019-07-17 NOTE — Progress Notes (Signed)
Izora Ribas, MD  Physician  Physical Medicine and Rehabilitation  PMR Pre-admission  Signed  Date of Service:  07/14/2019 1:52 PM      Related encounter: ED to Hosp-Admission (Discharged) from 07/12/2019 in [facility] Admission Coordinator Pre-Admission Assessment  Patient: Nicholas Hancock is an 84 y.o., male MRN: [id number] DOB: November 30, 1934 Height: 5' 8"  (172.7 cm) Weight: 72.6 kg                                                                                                                                                  Insurance Information HMO:     PPO: yes     PCP:      IPA:      80/20:      OTHER:  PRIMARY: UHC Medicare      Policy#: [id number]      Subscriber: Patient CM Name: Denyse Amass   Phone#: [phone]     Fax#: [phone] Pre-Cert#: [id number]     Employer:  Josem Kaufmann [id number] Everlene Balls number: [id number]) provided by Denyse Amass for admit to CIR. Pt is approved for 7 days (orignially 12/1-12/7).  Per CM Juliann Pulse, as pt plans to admit 12/4, next review date will now be 12/10. Clinical updates can be sent (f): [phone] (p): [phone]  Benefits:  Phone #: online     Name: [email or web address] Eff. Date: 08/13/2018 - 08/13/2019     Deduct: $290 ($290 met)      Out of Pocket Max: $3,290 (includes deductible - $381.02 met)      Life Max:  CIR: 80% coverage, 20% co-insurance; max co-insurance per stay 1-6 days is capped at $2,461.00, max co-insurance per stay 7-10 days is capped at $2,721.00, stay 11+ day co-insurance capped at members out of pocket max      SNF: 80% coverage, 20% co-insurance for days 1-100, max co-insurance for days 1-20 is $20/day, max co-insurance for days 21-100 is $178/day; limited to 100 days/cal yr Outpatient: 80% coverage, 20% co-insurance; limited by medical necessity     Home Health: 100% coverage, 0% co-insurance, $0/visit co-pay; limited by medical necessity (no more than 8 hrs/d and 35 hrs/wk)     DME: 80%  coverage, 20% co-insurance Providers:  SECONDARY: None       Policy#:       Subscriber:  CM Name:       Phone#:      Fax#:  Pre-Cert#:       Employer:  Benefits:  Phone #:      Name:  Eff. Date:      Deduct:       Out of Pocket Max:       Life Max:  CIR:       SNF:  Outpatient:  Co-Pay:  Home Health:       Co-Pay:  DME:     Co-Pay:   Medicaid Application Date:       Case Manager:  Disability Application Date:       Case Worker:   The "Data Collection Information Summary" for patients in Inpatient Rehabilitation Facilities with attached "Privacy Act [place] Records" was provided and verbally reviewed with: Patient and Family  Emergency Contact Information         Contact Information    Name Relation Home Work Mobile   Lipford,[name] Spouse [phone]  [phone]     Current Medical History  Patient Admitting Diagnosis: acute left lacunar infarct  History of Present Illness: Nicholas Hancock is an 84 year old male with history of hypertension, adrenal gland dysfunction, prior history of TIA/CVA maintained on aspirin followed by neurology services Dr. Loretta Plume, remote tobacco abuse. Presented 07/12/2019 with aphasia and leaning to the right. Cranial CT scan negative. Patient did not receive TPA. MRI of the brain showed small acute lacunar infarction in the left corona radiata with no associated hemorrhage or mass-effect. Several small additional acute to subacute infarcts of the bilateral occipital lobes and right frontal lobe. MRA with no large vessel occlusion. Echocardiogram with ejection fraction of 40% without emboli. Admission chemistries unremarkable. Urine drug screen negative. SARS coronavirus negative. Neurology follow-up maintained on aspirin for CVA prophylaxis. Neurology did not feel patient was a candidate for DAPTgiven concern for possible CAA. Tolerating regular diet. Cardiology service did follow-up for review of low ejection  fraction identified on echocardiogram. Telemetry reviewed showed short burst of atrial tachycardia not consistent with atrial fibrillation or flutter. Recommendations to start low-dose Toprol/Aldactone as well as continue low-dose aspirin. Therapy evaluations completed patient is to be admitted for a comprehensive rehab program on 07/17/2019.   Complete NIHSS TOTAL: 3 Glasgow Coma Scale Score: 13  Past Medical History      Past Medical History:  Diagnosis Date  . 1st degree AV block   . 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 Aortic valve insufficiency   . Arthritis    hands  . CVA (cerebral vascular accident) ([place])   . Elevated PSA    monitored by Dr. Rosana Hoes  . Hypertension    takes diuretic for adrenal gland dysfunction, BP will be high without diuretic  . Vitamin D deficiency     Family History  family history includes Cancer in his father and mother; Diabetes in his father; Heart disease in his mother; Stroke in his father.  Prior Rehab/Hospitalizations:  Has the patient had prior rehab or hospitalizations prior to admission? No  Has the patient had major surgery during 100 days prior to admission? No  Current Medications   Current Facility-Administered Medications:  .   stroke: mapping our early stages of recovery book, , Does not apply, Once, Jani Gravel, MD .  acetaminophen (TYLENOL) tablet 650 mg, 650 mg, Oral, Q4H PRN **OR** acetaminophen (TYLENOL) 160 MG/5ML solution 650 mg, 650 mg, Per Tube, Q4H PRN **OR** acetaminophen (TYLENOL) suppository 650 mg, 650 mg, Rectal, Q4H PRN, Jani Gravel, MD .  aspirin EC tablet 81 mg, 81 mg, Oral, Daily, Rosalin Hawking, MD, 81 mg at 07/17/19 2505 .  atorvastatin (LIPITOR) tablet 80 mg, 80 mg, Oral, Daily, Jani Gravel, MD, 80 mg at 07/17/19 3976 .  cholecalciferol (VITAMIN D3) tablet 1,000 Units, 1,000 Units, Oral, Oretha Milch, MD, 1,000 Units at  07/16/19 [phone] .  hydrALAZINE (APRESOLINE) injection 5 mg, 5 mg, Intravenous, Q6H PRN, Omar Person, NP, 5 mg at 07/14/19 2057 .  LORazepam (ATIVAN) injection 1 mg, 1 mg, Intravenous, Q4H PRN, [place], Audie Pinto, MD, 1 mg at 07/17/19 0026 .  losartan (COZAAR) tablet 25 mg, 25 mg, Oral, Daily, [place], [place], [place], 25 mg at 07/17/19 9528 .  metoprolol succinate (TOPROL-XL) 24 hr tablet 50 mg, 50 mg, Oral, Daily, Buford Dresser, MD, 50 mg at 07/17/19 4132 .  spironolactone (ALDACTONE) tablet 25 mg, 25 mg, Oral, Q0600, Almyra Deforest, PA, 25 mg at 07/17/19 0537 .  vitamin B-12 (CYANOCOBALAMIN) tablet 1,000 mcg, 1,000 mcg, Oral, Daily, Jani Gravel, MD, 1,000 mcg at 07/17/19 4401  Patients Current Diet:     Diet Order                  Diet - low sodium heart healthy         Diet Heart Room service appropriate? Yes; Fluid consistency: Thin  Diet effective now               Precautions / Restrictions Precautions Precautions: Fall Precaution Comments: pt reports falling "A LOT" Restrictions Weight Bearing Restrictions: No   Has the patient had 2 or more falls or a fall with injury in the past year?Yes  Prior Activity Level Community (5-7x/wk): active with wife at [facility]; retired from work but would go with wife for errands. would walk with wife daily, no longer drives.   Prior Functional Level Prior Function Level of Independence: Independent with assistive device(s) Comments: uses RW, reports wife drives and does grocery shopping. Unsure of accuracy of PLOF report  Self Care: Did the patient need help bathing, dressing, using the toilet or eating?  Independent  Indoor Mobility: Did the patient need assistance with walking from room to room (with or without device)? Independent  Stairs: Did the patient need assistance with internal or external stairs (with or without device)? Needed some help  Functional Cognition: Did the patient need help planning regular  tasks such as shopping or remembering to take medications? Needed some help  Home Assistive Devices / Equipment Home Equipment: Walker - 2 wheels, [place] - single point, Grab bars - toilet, Grab bars - tub/shower  Prior Device Use: Indicate devices/aids used by the patient prior to current illness, exacerbation or injury? single point cane for community mobility; RW for nighttime bathroom trips  Current Functional Level Cognition  Overall Cognitive Status: Impaired/Different from baseline Current Attention Level: Focused Orientation Level: Oriented to person, Disoriented to place, Disoriented to time, Disoriented to situation Following Commands: Follows one step commands inconsistently, Follows one step commands with increased time Safety/Judgement: Decreased awareness of safety, Decreased awareness of deficits General Comments: multimodal cues, max repitition for following commands Attention: Focused, Sustained Focused Attention: Appears intact Sustained Attention: Appears intact Memory: Impaired Memory Impairment: Decreased short term memory Awareness: Impaired Problem Solving: Impaired Problem Solving Impairment: Verbal complex Executive Function: Reasoning Reasoning: Impaired    Extremity Assessment (includes Sensation/Coordination)  Upper Extremity Assessment: Overall WFL for tasks assessed  Lower Extremity Assessment: Defer to PT evaluation    ADLs  Overall ADL's : Needs assistance/impaired Eating/Feeding: Sitting, Minimal assistance Grooming: Wash/dry hands, Wash/dry face, Minimal assistance, Cueing for sequencing, Cueing for compensatory techniques, Standing Grooming Details (indicate cue type and reason): Pt stood at sink to groom. Pt required min assist to maintain standing and VCs to remember to turn water off and to find the towels to his Left.  Pt impulsive, decreased attrntion span Upper Body Bathing: Cueing for compensatory techniques, Sitting, Minimal  assistance Upper Body Bathing Details (indicate cue type and reason): simulated, mod A for cues to stay on task for completion Lower Body Bathing: Moderate assistance, Sit to/from stand, Cueing for compensatory techniques, Cueing for sequencing Lower Body Bathing Details (indicate cue type and reason): cues to continue with the task. Upper Body Dressing : Min guard, Sitting Upper Body Dressing Details (indicate cue type and reason): min assist to donn gown around the back side of himself.  This is unfamiliar so may be difficult due to this.  Lower Body Dressing: Sit to/from stand, Cueing for compensatory techniques, Cueing for sequencing, Moderate assistance Lower Body Dressing Details (indicate cue type and reason): Difficulty donning R sock seated EOB while heavily leaning forward Toilet Transfer: Moderate assistance, Minimal assistance, Ambulation, RW, Cueing for safety, Cueing for sequencing, Comfort height toilet, Grab bars Toilet Transfer Details (indicate cue type and reason): Pt walked to bathroom to toilet with mod assist.  Pt not safe with walker use pushing walker far ahead of himself.  Pt did run into furniture on the R and on the L when using the walker and was unable to correct without therapist realigning walker. Toileting- Clothing Manipulation and Hygiene: Moderate assistance, Sit to/from stand, Cueing for compensatory techniques Toileting - Clothing Manipulation Details (indicate cue type and reason): mod assist for balance while therpist cleaned pt. Functional mobility during ADLs: Moderate assistance, Minimal assistance, Rolling walker General ADL Comments: Poor cognition and balance. Difficulty donning R sock seated EOB while heavily leaning forward    Mobility  Overal bed mobility: Needs Assistance Bed Mobility: Supine to Sit, Sit to Supine Supine to sit: Min assist Sit to supine: Min assist General bed mobility comments: min assist for bed mobility due to decreased  awareness    Transfers  Overall transfer level: Needs assistance Equipment used: Rolling walker (2 wheeled) Transfers: Sit to/from Stand Sit to Stand: Mod assist, From elevated surface Stand pivot transfers: Min assist General transfer comment: Patient unsteady with standing leaning to right with feet together off center to left in walker. Cues needed for patient to separate feet to improve balance.    Ambulation / Gait / Stairs / Wheelchair Mobility  Ambulation/Gait Ambulation/Gait assistance: Mod assist, Max assist Gait Distance (Feet): 30 Feet Assistive device: Rolling walker (2 wheeled) Gait Pattern/deviations: Step-to pattern, Shuffle, Decreased stride length, Narrow base of support, Decreased step length - right General Gait Details: Patient is very unsteady with ambulation, one LOB when he let go of walker reaching for door of room. Requires mod assist for balance and max assist for steering of rolling walker. Flexed posture, feet ouside of walker, very unaware of self/limitations Gait velocity: decreased    Posture / Balance Dynamic Sitting Balance Sitting balance - Comments: pt sat EOB with R lean initially and able to self correct after max cuing, pt able to pull trunk forward after cuing. Difficulty donning R sock seated EOB while heavily leaning forward Balance Overall balance assessment: Needs assistance Sitting-balance support: Feet supported, Single extremity supported Sitting balance-Leahy Scale: Fair Sitting balance - Comments: pt sat EOB with R lean initially and able to self correct after max cuing, pt able to pull trunk forward after cuing. Difficulty donning R sock seated EOB while heavily leaning forward Postural control: Posterior lean Standing balance support: Bilateral upper extremity supported, During functional activity Standing balance-Leahy Scale: Poor Standing balance comment: reliant on RW and physical assist for  standing balance and ambulation     Special needs/care consideration BiPAP/CPAP: no CPM: no Continuous Drip IV: no Dialysis: no        Days: no Life Vest: no Oxygen: no Special Bed: no Trach Size: no Wound Vac (area): no      Location: no Skin: abrasion to right, left anterior leg, rash to right arm                     Bowel mgmt: no BM recorded this admission*? Bladder mgmt:external catheter, incontinent Diabetic mgmt: no Behavioral consideration : restless at night, been receiving IV Lorazepam - in mittens on day of admit Chemo/radiation : no     Previous Home Environment (from acute therapy documentation) Living Arrangements: Spouse/significant other  Lives With: Spouse Available Help at Discharge: Family, Available 24 hours/day Type of Home: House Home Layout: One level Home Access: Level entry Bathroom Shower/Tub: Chiropodist: Standard Additional Comments: unsure of patients reliability  Discharge Living Setting Plans for Discharge Living Setting: Patient's home, Lives with (comment)(spouse at ILF) Type of Home at Discharge: [place] Name at Discharge: [facility] Discharge Home Layout: One level Discharge Home Access: Level entry Discharge Bathroom Shower/Tub: Walk-in shower Discharge Bathroom Toilet: Standard Discharge Bathroom Accessibility: Yes How Accessible: Accessible via walker Does the patient have any problems obtaining your medications?: No  Social/Family/Support Systems Patient Roles: Spouse Contact Information: wife: [name] [phone] Anticipated Caregiver: wife  Anticipated Caregiver's Contact Information: see above Ability/Limitations of Caregiver: Supervision; light Min A Caregiver Availability: 24/7 Discharge Plan Discussed with Primary Caregiver: Yes Is Caregiver In Agreement with Plan?: Yes Does Caregiver/Family have Issues with Lodging/Transportation while Pt is in Rehab?: No   Goals/Additional Needs  Patient/Family Goal for Rehab: PT/OT: Mod I; SLP: Supervision Expected length of stay: 8-12 days  Cultural Considerations: TBD Dietary Needs: heart healthy, thin liquids  Equipment Needs: TBD Pt/Family Agrees to Admission and willing to participate: Yes Program Orientation Provided & Reviewed with Pt/Caregiver Including Roles  & Responsibilities: Yes(wife)  Barriers to Discharge: Home environment access/layout, Lack of/limited family support, Insurance for SNF coverage  Barriers to Discharge Comments: wife can only do light assistance.    Decrease burden of Care through IP rehab admission: NA   Possible need for SNF placement upon discharge:Not anticipated; pt has good social support from his wife. Anticipate pt will be able to progress to supervision level to return home in a reasonable period of time.    Patient Condition: This patient's medical and functional status has changed since the consult dated: 07/13/2019 in which the Rehabilitation Physician determined and documented that the patient's condition is appropriate for intensive rehabilitative care in an inpatient rehabilitation facility. See "History of Present Illness" (above) for medical update. Functional changes are: some decreased ability for functional transfers and ambulation, from Min A to Mod A for transfers and Mod/Max A for ambulation; Decline noted and may be due to doses of lorazepam given due to increased confusion. UA and culture requested. Patient's medical and functional status update has been discussed with the Rehabilitation physician and patient remains appropriate for inpatient rehabilitation. Will admit to inpatient rehab today.  Preadmission Screen Completed By:  Raechel Ache, OT, 07/17/2019 11:23 AM ______________________________________________________________________   Discussed status with Dr. Ranell Patrick on 07/17/2019 at 11:23AM and received approval for admission today.  Admission Coordinator:  Raechel Ache, time 11:23AM/Date12/11/2018         Revision History Date/Time User Provider Type Action  07/17/2019 11:26 AM Raulkar, Clide Deutscher, MD Physician Sign  07/17/2019 11:25 AM Raechel Ache, OT Rehab Admission Coordinator Share  07/17/2019 11:24 AM Raechel Ache, OT Rehab Admission Coordinator Share  View Details Report

## 2019-07-17 NOTE — Progress Notes (Signed)
Inpatient Rehabilitation-Admissions Coordinator   Received insurance approval and medical clearance from attending service for admit to CIR today. Pt and his wife are in agreement to pursue CIR today. Insurance benefit letter reviewed and consent forms signed. RN notified of plan. AC will notify TOC team.   Please call if questions.   Jhonnie Garner, OTR/L  Rehab Admissions Coordinator  [phone] 07/17/2019 11:21 AM

## 2019-07-17 NOTE — Progress Notes (Signed)
Patient admitted to IP rehab during shift, patient was sleeping accompanied by wife. Skin assessment preformed by this Probation officer and additional nurse and noted boggy bilateral heels protective foam dressings added to area. Adria Devon, LPN.

## 2019-07-18 ENCOUNTER — Inpatient Hospital Stay (HOSPITAL_COMMUNITY): Payer: Medicare Other | Admitting: Speech Pathology

## 2019-07-18 ENCOUNTER — Inpatient Hospital Stay (HOSPITAL_COMMUNITY): Payer: Medicare Other | Admitting: Occupational Therapy

## 2019-07-18 ENCOUNTER — Inpatient Hospital Stay (HOSPITAL_COMMUNITY): Payer: Medicare Other | Admitting: Physical Therapy

## 2019-07-18 DIAGNOSIS — I639 Cerebral infarction, unspecified: Secondary | ICD-10-CM

## 2019-07-18 LAB — URINE CULTURE: Culture: >=100000 — AB

## 2019-07-18 NOTE — Evaluation (Signed)
Speech Language Pathology Assessment and Plan  Patient Details  Name: Nicholas Hancock MRN: [id number] Date of Birth: [birth year]  SLP Diagnosis: Cognitive Impairments;Dysphagia  Rehab Potential: Fair ELOS: 2 weeks    Today's Date: 07/18/2019 SLP Individual Time: 5956-3875 SLP Individual Time Calculation (min): 60 min   Problem List:  Patient Active Problem List   Diagnosis Date Noted  . Left basal ganglia embolic stroke ([place]) 64/33/2951  . Atrial tachycardia ([place]) 07/15/2019  . Chronic systolic CHF (congestive heart failure) ([place]) 07/15/2019  . Acute CVA (cerebrovascular accident) ([place]) 07/13/2019  . Hypertension   . Vitamin D deficiency   . Adrenal gland dysfunction ([place])   . Aortic valve insufficiency   . 1st degree AV block   . TIA (transient ischemic attack) 07/12/2019  . 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 Bilateral inguinal hernia-left>right 06/13/2012  . Benign hypertensive heart disease without heart failure 05/22/2011   Past Medical History:  Past Medical History:  Diagnosis Date  . 1st degree AV block   . Adrenal gland dysfunction ([place])    hyperaldesteroa- takes diuretic for over production of aldosterone  . Aortic valve insufficiency   . Arthritis    hands  . CVA (cerebral vascular accident) ([place])   . Elevated PSA    monitored by Dr. Rosana Hoes  . Hypertension    takes diuretic for adrenal gland dysfunction, BP will be high without diuretic  . Vitamin D deficiency    Past Surgical History:  Past Surgical History:  Procedure Laterality Date  . HAND SURGERY  09/21/1987   Enlarged mass on right palm overlying right long finger metacarpophalangeal joint removed  . INGUINAL HERNIA REPAIR  07/15/2012   Procedure: LAPAROSCOPIC BILATERAL INGUINAL HERNIA REPAIR;  Surgeon: Pedro Earls, MD;  Location: [facility];  Service: General;  Laterality: Bilateral;  Laparoscopic Bilateral Inguinal Hernia Repairs with Mesh  . left knee arthroscopy  3 years ago  .  PELVIC FRACTURE SURGERY     chippped in high school  . TONSILLECTOMY AND ADENOIDECTOMY      Assessment / Plan / Recommendation Clinical Impression [name] is an 84 year old right-handed male history of hypertension, adrenal gland dysfunction, prior history of TIA/CVA maintained on aspirin followed by neurology services Dr. Loretta Plume, remote tobacco abuse.  Per chart review lives with spouse independent with assistive device prior to admission. Patient lives with his wife independent living [place]. Presented 07/12/2019 with aphasia and leaning to the right.  Cranial CT scan negative.  Patient did not receive TPA.  MRI of the brain showed small acute lacunar infarction in the left corona radiata with no associated hemorrhage or mass-effect.  Several small additional acute to subacute infarcts of the bilateral occipital lobes and right frontal lobe.  MRA with no large vessel occlusion.  Echocardiogram with ejection fraction of 40% without emboli.  Tolerating regular diet.  Therapy evaluations completed patient was admitted for a comprehensive rehab program.  Pt with severe cognitive impairments impacting the areas of orientation, sustained attention, memory, problem solving and awareness of deficits. Pt with decreased speech intelligibility at the sentence level d/t decreased vocal intensity and overall imprecise speech. He lacks perception that speech is unintelligible.  Over the course of this evaluation, pt required Max A verbal cues every 30 seconds to 1 minute d/t continued off-topic comments, Max A verbal cues to cease talking with food in his mouth, he required set up assistance with his meal d/t deficits with problem solving/sustained attention and despite frequent Max A verbal  cues pt unable to retain/recall information related to location, situation and therefore pt frequently attempted to get out of bed. Pt was not able to increase speech intelligibility at the sentence level even with  Max A verbal cues to increase vocal intensity. Despite deficits in speech intelligibility pt's expressive language appears intact.   Difficulty noted when consuming regular breakfast with thin liquids - most specifically when consuming thin liquids. Pt presents with subtle throat clear that is likely attributable decreased attention to bolus as well as inability to cease talking with food or liquid in his mouth. Recommend ST to follow pt for cognitive linguistic deficits as well as dysphagia management/toleration of current diet. Given altered mental status, recommend full nursing supervision with meals.   Skilled ST is required to target the above mentioned deficits to increase functional independence and reduce caregiver burden. Given severity of cognitive deficits, anticipate that pt will likely require Min A at discharge with follow up [facility] services.    Skilled Therapeutic Interventions          Skilled treatment provided from [phone]. Session focused cognition goals targeting orientation and safety awareness. Per chart review and nursing report, pt with frequent attempts to get up despite redirection from tele-sitter. SLP also facilitated session by providing education on use of call-light, Max A verbal and physical cues for pt to locate call light and Mod A cues for pt to demonstrate use of call light. SLP further facilitated session by providing visual cues and repetitive cues to increase recall of location, situation and safety precautions. Pt able to read written cues and these cues were placed on pt's bed. Despite multimodal cues and extensive repetition of information, pt was not able to immediately provide safe answers to any questions related to safety.    SLP Assessment  Patient will need skilled Speech Lanaguage Pathology Services during CIR admission    Recommendations  SLP Diet Recommendations: Age appropriate regular solids;Thin Liquid Administration via: Cup;Straw Medication  Administration: Whole meds with liquid Supervision: Patient able to self feed;Full supervision/cueing for compensatory strategies Compensations: Slow rate;Small sips/bites;Minimize environmental distractions Postural Changes and/or Swallow Maneuvers: Seated upright 90 degrees Oral Care Recommendations: Oral care BID Recommendations for Other Services: Neuropsych consult Patient destination: [facility] (SNF) Follow up Recommendations: Skilled Nursing facility;24 hour supervision/assistance Equipment Recommended: None recommended by SLP    SLP Frequency 3 to 5 out of 7 days   SLP Duration  SLP Intensity  SLP Treatment/Interventions 2 weeks  Minumum of 1-2 x/day, 30 to 90 minutes       Pain Pain Assessment Pain Scale: 0-10 Pain Score: 0-No pain  Prior Functioning Cognitive/Linguistic Baseline: Baseline deficits Baseline deficit details: Wife reports some changes over the past months.  Pt used to regularly and eagerly follow stock market, but has stopped that and does not read as much as he used to either. Type of Home: House  Lives With: Spouse Available Help at Discharge: Family;Available 24 hours/day Education: Master's Degree Vocation: Retired  Programmer, systems Overall Cognitive Status: Impaired/Different from baseline Arousal/Alertness: Awake/alert Orientation Level: Oriented to person;Disoriented to time;Oriented to place;Oriented to situation(fluctuates) Attention: Sustained Sustained Attention: Impaired Sustained Attention Impairment: Verbal basic;Functional basic Memory: Impaired Memory Impairment: Decreased short term memory;Decreased recall of new information Decreased Short Term Memory: Verbal basic;Functional basic Awareness: Impaired Awareness Impairment: Intellectual impairment Problem Solving: Impaired Problem Solving Impairment: Verbal basic;Functional basic Executive Function: (all areas impaired d/t lower level  deficits) Safety/Judgment: Impaired Comments: pt frequently attempts to get out  of bed  Comprehension Auditory Comprehension Overall Auditory Comprehension: Impaired(d/t deficits in cognition) Expression Expression Primary Mode of Expression: Verbal Verbal Expression Overall Verbal Expression: Impaired(d/t cognitive deficits) Written Expression Dominant Hand: Right Written Expression: Not tested Oral Motor Oral Motor/Sensory Function Overall Oral Motor/Sensory Function: Generalized oral weakness Motor Speech Overall Motor Speech: Impaired Respiration: Within functional limits Phonation: Low vocal intensity Resonance: Within functional limits Articulation: Impaired Level of Impairment: Sentence Intelligibility: Intelligibility reduced Sentence: 50-74% accurate Conversation: 25-49% accurate Motor Planning: Witnin functional limits Motor Speech Errors: Not applicable   Bedside Swallowing Assessment General Date of Onset: 07/12/19 Previous Swallow Assessment: BSE recommended regular with thin liquids Diet Prior to this Study: Regular;Thin liquids Temperature Spikes Noted: No Behavior/Cognition: Cooperative;Pleasant mood;Alert;Requires cueing;Distractible Oral Cavity - Dentition: Adequate natural dentition Self-Feeding Abilities: Needs set up Vision: Functional for self-feeding Patient Positioning: Upright in bed Baseline Vocal Quality: Normal Volitional Cough: Strong Volitional Swallow: Able to elicit  Oral Care Assessment   Ice Chips Ice chips: Not tested Thin Liquid Thin Liquid: Impaired Presentation: Cup Pharyngeal  Phase Impairments: Throat Clearing - Immediate Other Comments: suspect d/t inattention to bolus as related to cognitive deficits Nectar Thick Nectar Thick Liquid: Not tested Honey Thick Honey Thick Liquid: Not tested Puree Puree: Within functional limits Presentation: Spoon;Self Fed Solid Solid: Impaired Presentation: Self Fed Pharyngeal  Phase Impairments: Throat Clearing - Immediate Other Comments: suspect d/t decreased attention to bolus and attempts to talk while bolus was in his mouth BSE Assessment Risk for Aspiration Impact on safety and function: Mild aspiration risk Other Related Risk Factors: Cognitive impairment  Short Term Goals: Week 1: SLP Short Term Goal 1 (Week 1): Pt will utilize call bell in 7 out of 10 opportunities with Mod A cues. SLP Short Term Goal 2 (Week 1): Pt will sustain attention to basic familiar task for 3 minutes with Mod A cues. SLP Short Term Goal 3 (Week 1): Given Mod A cues, pt will utilize external memory aids to recall orientation information. SLP Short Term Goal 4 (Week 1): Pt will identify 1 physical and 1 cognitive deficits related to acute CVA in 5 out of 10 attempts with Mod A cues. SLP Short Term Goal 5 (Week 1): Pt will complete basic familiar problem solving tasks with Mod A cues. SLP Short Term Goal 6 (Week 1): Pt will consume current diet with minimal overt s/s of aspiration and Mod A cues for use of compensatory swallow strategies (specifically not talking).  Refer to Care Plan for Long Term Goals  Recommendations for other services: Neuropsych  Discharge Criteria: Patient will be discharged from SLP if patient refuses treatment 3 consecutive times without medical reason, if treatment goals not met, if there is a change in medical status, if patient makes no progress towards goals or if patient is discharged from hospital.  The above assessment, treatment plan, treatment alternatives and goals were discussed and mutually agreed upon: by patient  Lexee Brashears 07/18/2019, 11:08 AM

## 2019-07-18 NOTE — Plan of Care (Signed)
  Problem: Consults Goal: RH STROKE PATIENT EDUCATION Description: See Patient Education module for education specifics  Outcome: Progressing   Problem: RH BOWEL ELIMINATION Goal: RH STG MANAGE BOWEL WITH ASSISTANCE Description: STG Manage Bowel with min/mod Assistance. Outcome: Progressing   Problem: RH BLADDER ELIMINATION Goal: RH STG MANAGE BLADDER WITH ASSISTANCE Description: STG Manage Bladder With min/mod Assistance Outcome: Progressing   Problem: RH SKIN INTEGRITY Goal: RH STG SKIN FREE OF INFECTION/BREAKDOWN Description: Patients skin will remain free from further breakdown or injury during rehab stay with min/mod assist. Outcome: Progressing Goal: RH STG MAINTAIN SKIN INTEGRITY WITH ASSISTANCE Description: STG Maintain Skin Integrity With min/mod Assistance. Outcome: Progressing   Problem: RH SAFETY Goal: RH STG ADHERE TO SAFETY PRECAUTIONS W/ASSISTANCE/DEVICE Description: STG Adhere to Safety Precautions With supervision/min Assistance/Device. Outcome: Progressing   Problem: RH COGNITION-NURSING Goal: RH STG USES MEMORY AIDS/STRATEGIES W/ASSIST TO PROBLEM SOLVE Description: STG Uses Memory Aids/Strategies With supervision/ min Assistance to Problem Solve. Outcome: Progressing   Problem: RH KNOWLEDGE DEFICIT Goal: RH STG INCREASE KNOWLEDGE OF HYPERTENSION Description: Patient and caregiver will verbalize understanding of HTN including monitoring, medications, diet, exercise, and follow up care with min assist. Outcome: Progressing Goal: RH STG INCREASE KNOWLEGDE OF HYPERLIPIDEMIA Description: Patient and caregiver will verbalize understanding of HLD including monitoring, medications, diet, exercise, and follow up care with min assist. Outcome: Progressing Goal: RH STG INCREASE KNOWLEDGE OF STROKE PROPHYLAXIS Description: Patient and caregiver will verbalize understanding of stroke prophylaxis including monitoring, medications, diet, exercise, and follow up care with  min assist. Outcome: Progressing   

## 2019-07-18 NOTE — Progress Notes (Signed)
Patient slept for about 6 hours total. Between 4am-5am, patient has been trying to get out of bed. Pt. stated he wants to get up and cannot go back to sleep. Pt transferred to wheelchair and brought to RN station to be watched.

## 2019-07-18 NOTE — Progress Notes (Signed)
[facility] PHYSICAL MEDICINE & REHABILITATION PROGRESS NOTE   Subjective/Complaints:  Pt lying comfortably in bed  ROS- neg CP, SOB, N/V/D  Objective:   No results found. Recent Labs    07/16/19 0531 07/17/19 0557  WBC 10.6* 10.7*  HGB 15.5 15.2  HCT 44.9 44.5  PLT 193 172   Recent Labs    07/16/19 0531 07/17/19 0557  NA 142 142  K 3.5 3.6  CL 105 110  CO2 24 22  GLUCOSE 130* 125*  BUN 34* 45*  CREATININE 1.32* 1.19  CALCIUM 9.0 8.9    Intake/Output Summary (Last 24 hours) at 07/18/2019 4332 Last data filed at 07/18/2019 0659 Gross per 24 hour  Intake 480 ml  Output 600 ml  Net -120 ml     Physical Exam: Vital Signs Blood pressure (!) 160/62, pulse 60, temperature 97.9 F (36.6 C), temperature source Oral, resp. rate 20, SpO2 97 %.   General: No acute distress Mood and affect are appropriate Heart: Regular rate and rhythm no rubs murmurs or extra sounds Lungs: Clear to auscultation, breathing unlabored, no rales or wheezes Abdomen: Positive bowel sounds, soft nontender to palpation, nondistended Extremities: No clubbing, cyanosis, or edema Skin: No evidence of breakdown, no evidence of rash Neurologic: Cranial nerves II through XII intact, motor strength is 5/5 in bilateral deltoid, bicep, tricep, grip, hip flexor, knee extensors, ankle dorsiflexor and plantar flexor Orientation to person and place not time (April, Wednesday ) Sensory exam normal sensation to light touch and proprioception in bilateral upper and lower extremities Cerebellar exam normal finger to nose to finger as well as heel to shin in bilateral upper and lower extremities Musculoskeletal: Full range of motion in all 4 extremities. No joint swelling   Assessment/Plan: 1. Functional deficits secondary to bilateral cortical and subcortical infarcts  which require 3+ hours per day of interdisciplinary therapy in a comprehensive inpatient rehab setting.  Physiatrist is providing close  team supervision and 24 hour management of active medical problems listed below.  Physiatrist and rehab team continue to assess barriers to discharge/monitor patient progress toward functional and medical goals  Care Tool:  Bathing              Bathing assist       Upper Body Dressing/Undressing Upper body dressing        Upper body assist      Lower Body Dressing/Undressing Lower body dressing            Lower body assist       Toileting Toileting    Toileting assist Assist for toileting: 2 Helpers     Transfers Chair/bed transfer  Transfers assist     Chair/bed transfer assist level: 2 Helpers     Locomotion Ambulation   Ambulation assist              Walk 10 feet activity   Assist           Walk 50 feet activity   Assist           Walk 150 feet activity   Assist           Walk 10 feet on uneven surface  activity   Assist           Wheelchair     Assist               Wheelchair 50 feet with 2 turns activity    Assist  Wheelchair 150 feet activity     Assist          Blood pressure (!) 160/62, pulse 60, temperature 97.9 F (36.6 C), temperature source Oral, resp. rate 20, SpO2 97 %.     Medical Problem List and Plan: 1.  Mild aphasia with decreased functional mobility secondary to small acute left basal ganglia corona radiata and multiple small subacute bilateral occipital and right frontal lobe infarcts ( ?embolic)  as well as history of TIA/CVA 2019 followed by [facility]             -patient may shower             -ELOS/Goals: sup PT, OT, SLP due to cognition  CIR evals today              -currently ambulating 30 feet w/ RW, modA balance and maxA for steering walker. 2.  Antithrombotics: -DVT/anticoagulation: SCDs             -antiplatelet therapy: Aspirin 81 mg daily 3. Pain Management: Tylenol as needed 4. Mood: Provide emotional support              -antipsychotic agents: N/A 5. Neuropsych: This patient is capable of making decisions on his own behalf. 6. Skin/Wound Care: Routine skin checks 7. Fluids/Electrolytes/Nutrition: Routine in and outs with follow-up chemistries 8.  Permissive hypertension.  Toprol-XL 50 mg daily,Cozaar 25 mg daily, Aldactone 50 mg daily.  Monitor with increased mobility Vitals:   07/18/19 0350 07/18/19 0901  BP: (!) 160/62   Pulse: (!) 55 60  Resp: 20   Temp: 97.9 F (36.6 C)   SpO2: 97%    9.  Acute systolic heart failure.  Follow-up per cardiology services.  Continue Aldactone as well as beta-blocker. Echo done on acute side shows EF 35=40% which was decreased from Echo 1 month prior with EF 55-60%. Lung exam normal today  10.  Hyperlipidemia.  Lipitor 11.  Remote tobacco abuse.  Counseling  LOS: 1 days A FACE TO FACE EVALUATION WAS PERFORMED  Erick Colace 07/18/2019, 9:27 AM

## 2019-07-18 NOTE — Evaluation (Signed)
Physical Therapy Assessment and Plan  Patient Details  Name: Nicholas Hancock MRN: [id number] Date of Birth: [birth year]  PT Diagnosis: Abnormal posture, Abnormality of gait, Ataxia, Coordination disorder, Hemiplegia dominant, Impaired cognition and Muscle weakness Rehab Potential: Good ELOS: 12-16 DAYS   Today's Date: 07/18/2019 PT Individual Time: 1300-1400 PT Individual Time Calculation (min): 60 min    Problem List:  Patient Active Problem List   Diagnosis Date Noted  . Left basal ganglia embolic stroke ([place]) 40/98/1191  . Atrial tachycardia ([place]) 07/15/2019  . Chronic systolic CHF (congestive heart failure) ([place]) 07/15/2019  . Acute CVA (cerebrovascular accident) ([place]) 07/13/2019  . Hypertension   . Vitamin D deficiency   . Adrenal gland dysfunction ([place])   . Aortic valve insufficiency   . 1st degree AV block   . TIA (transient ischemic attack) 07/12/2019  . 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 Bilateral inguinal hernia-left>right 06/13/2012  . Benign hypertensive heart disease without heart failure 05/22/2011    Past Medical History:  Past Medical History:  Diagnosis Date  . 1st degree AV block   . Adrenal gland dysfunction ([place])    hyperaldesteroa- takes diuretic for over production of aldosterone  . Aortic valve insufficiency   . Arthritis    hands  . CVA (cerebral vascular accident) ([place])   . Elevated PSA    monitored by Dr. Rosana Hoes  . Hypertension    takes diuretic for adrenal gland dysfunction, BP will be high without diuretic  . Vitamin D deficiency    Past Surgical History:  Past Surgical History:  Procedure Laterality Date  . HAND SURGERY  09/21/1987   Enlarged mass on right palm overlying right long finger metacarpophalangeal joint removed  . INGUINAL HERNIA REPAIR  07/15/2012   Procedure: LAPAROSCOPIC BILATERAL INGUINAL HERNIA REPAIR;  Surgeon: Pedro Earls, MD;  Location: [facility];  Service: General;  Laterality: Bilateral;  Laparoscopic  Bilateral Inguinal Hernia Repairs with Mesh  . left knee arthroscopy  3 years ago  . PELVIC FRACTURE SURGERY     chippped in high school  . TONSILLECTOMY AND ADENOIDECTOMY      Assessment & Plan Clinical Impression: Patient is a 84 year old malewithhistory of hypertension, adrenal gland dysfunction, prior history of TIA/CVA maintained on aspirin followed by neurology services Dr. Loretta Plume, remote tobacco abuse. Presented 07/12/2019 with aphasia and leaning to the right. Cranial CT scan negative. Patient did not receive TPA. MRI of the brain showed small acute lacunar infarction in the left corona radiata with no associated hemorrhage or mass-effect. Several small additional acute to subacute infarcts of the bilateral occipital lobes and right frontal lobe. MRA with no large vessel occlusion. Echocardiogram with ejection fraction of 40% without emboli. Admission chemistries unremarkable. Urine drug screen negative. SARS coronavirus negative. Neurology follow-up maintained on aspirin for CVA prophylaxis. Neurology did not feel patient was a candidate for DAPTgiven concern for possible CAA. Tolerating regular diet. Cardiology service did follow-up for review of low ejection fraction identified on echocardiogram. Telemetry reviewed showed short burst of atrial tachycardia not consistent with atrial fibrillation or flutter.   Patient transferred to CIR on 07/17/2019 .   Patient currently requires mod with mobility secondary to muscle weakness and muscle joint tightness, decreased cardiorespiratoy endurance, motor apraxia, ataxia and decreased coordination, decreased midline orientation, decreased initiation, decreased attention, decreased awareness, decreased problem solving, decreased safety awareness, decreased memory and delayed processing and decreased sitting balance, decreased standing balance, decreased postural control, hemiplegia and decreased balance strategies.  Prior to  hospitalization, patient  was modified independent  with mobility and lived with Spouse in a Apartment(independent living) home.  Home access is  Level entry.  Patient will benefit from skilled PT intervention to maximize safe functional mobility, minimize fall risk and decrease caregiver burden for planned discharge home with 24 hour supervision.  Anticipate patient will benefit from follow up [place] at discharge.  PT - End of Session Activity Tolerance: Tolerates 10 - 20 min activity with multiple rests Endurance Deficit: Yes PT Assessment Rehab Potential (ACUTE/IP ONLY): Good PT Barriers to Discharge: Decreased caregiver support;Insurance for SNF coverage PT Patient demonstrates impairments in the following area(s): Balance;Behavior;Endurance;Motor;Nutrition;Perception;Safety;Sensory;Skin Integrity PT Transfers Functional Problem(s): Bed Mobility;Bed to Chair;Car;Furniture;Floor PT Locomotion Functional Problem(s): Ambulation;Wheelchair Mobility;Stairs PT Plan PT Intensity: Minimum of 1-2 x/day ,45 to 90 minutes PT Frequency: 5 out of 7 days PT Duration Estimated Length of Stay: 12-16 DAYS PT Treatment/Interventions: Balance/vestibular training;Cognitive remediation/compensation;Disease management/prevention;Discharge planning;Community reintegration;DME/adaptive equipment instruction;Functional electrical stimulation;Functional mobility training;Patient/family education;Neuromuscular re-education;Pain management;Psychosocial support;Skin care/wound management;Splinting/orthotics;Therapeutic Exercise;Therapeutic Activities;Stair training;UE/LE Strength taining/ROM;UE/LE Coordination activities;Visual/perceptual remediation/compensation;Wheelchair propulsion/positioning;Ambulation/gait training PT Transfers Anticipated Outcome(s): supervision assist with LRAD PT Locomotion Anticipated Outcome(s): ambulatory with LRAD with supervision assist PT Recommendation Recommendations for Other Services:  Therapeutic Recreation consult Therapeutic Recreation Interventions: Outing/community reintergration Follow Up Recommendations: Home health PT Patient destination: Home Equipment Recommended: To be determined  Skilled Therapeutic Intervention Pt received sitting on EOB with NT present to finish lunch. Pt and agreeable to PT. Noted to have severe R lateral lean sitting EOB. PT instructed patient in PT Evaluation and initiated treatment intervention; see below for results. PT educated patient in [place], rehab potential, rehab goals, and discharge recommendations. Pt required mod assist for stand pivot transfer with HHA and max cues for safety and sequencing as listed below. Mod assist also required for gait with HHA x 30 ft as well as with RW x 43f. Mod assist for car management with multiple multimodal cues for improved safety, with only slight carryover for safety corrections. [facility] returned to [facility] and [facility] and all needs met.       PT Evaluation Precautions/Restrictions Precautions Precautions: Fall General   Vital SignsTherapy Vitals Temp: 97.6 F (36.4 C) Pulse Rate: (!) 55 Resp: 12 BP: (!) 145/65 Patient Position (if appropriate): Sitting Oxygen Therapy SpO2: 98 % O2 Device: Room Air Pain   denies Home Living/Prior Functioning Home Living Available Help at Discharge: Family;Available 24 hours/day Type of Home: Apartment(independent living) Home Access: Level entry Home Layout: One level Bathroom Shower/Tub: TGovernment social research officerAccessibility: Yes Additional Comments[place]  Lives With: Spouse Prior Function Level of Independence: Independent with basic ADLs Driving: No(spouse drove) Comments: uses RW at night and cane durring the day Vision/Perception  Praxis Praxis: Impaired Praxis Impairment Details: Initiation;Ideation;Motor planning   Cognition Arousal/Alertness: Awake/alert Attention: Sustained Sustained Attention: Impaired Memory: Impaired Immediate Memory Recall: Sock;Blue;Bed Memory Recall Sock: Not able to recall Memory Recall Blue: Not able to recall Memory Recall Bed: Not able to recall Awareness: Impaired Problem Solving: Impaired Sensation Sensation Light Touch: Appears Intact Proprioception: Impaired by gross assessment Coordination Gross Motor Movements are Fluid and Coordinated: No Fine Motor Movements are Fluid and Coordinated: No Coordination and Movement Description: mild dysmetria Finger Nose Finger Test: Mild ataxia bilaterally Motor  Motor Motor: Abnormal postural alignment and control;Ataxia;Hemiplegia Motor - Skilled Clinical Observations: mild Rt hemi with ataxia and poor postural control  Mobility Bed Mobility Bed Mobility: Rolling Right;Rolling  Left;Supine to Sit;Sit to Supine Rolling Right: Supervision/verbal cueing Rolling Left: Supervision/Verbal cueing Supine to Sit: Minimal Assistance - Patient > 75% Sit to Supine: Minimal Assistance - Patient > 75% Transfers Transfers: Sit to [employer] Transfers Sit to Stand: Moderate Assistance - Patient 50-74% Stand Pivot Transfers: Moderate Assistance - Patient 50 - 74% Transfer (Assistive device): None Locomotion  Gait Ambulation: Yes Gait Assistance: Moderate Assistance - Patient 50-74% Gait Distance (Feet): 40 Feet Assistive device: None;1 person hand held assist Gait Gait: Yes Gait Pattern: Impaired Gait Pattern: Lateral trunk lean to right;Narrow base of support;Right flexed knee in stance Stairs / Additional Locomotion Stairs: No Wheelchair Mobility Wheelchair Mobility: Yes Wheelchair Assistance: Moderate Assistance - Patient 50 - 74% Wheelchair Propulsion: Both upper extremities Wheelchair Parts Management: Needs assistance Distance: 75  Trunk/Postural Assessment  Cervical Assessment Cervical Assessment:  Exceptions to WFL(forward head) Thoracic Assessment Thoracic Assessment: Exceptions to WFL(Kyphotic) Lumbar Assessment Lumbar Assessment: Exceptions to WFL(Posterior pelvic tilt) Postural Control Postural Control: Deficits on evaluation(Rt lean and forward flexed when sitting/standing during functional activity)  Balance Balance Balance Assessed: Yes Dynamic Sitting Balance Dynamic Sitting - Balance Support: No upper extremity supported;During functional activity Dynamic Sitting - Level of Assistance: 3: Mod assist(donning Lt gripper sock) Dynamic Sitting - Balance Activities: Forward lean/weight shifting;Lateral lean/weight shifting Static Standing Balance Static Standing - Level of Assistance: 3: Mod assist Dynamic Standing Balance Dynamic Standing - Balance Support: No upper extremity supported Dynamic Standing - Level of Assistance: 2: Max assist;3: Mod assist Dynamic Standing - Balance Activities: Lateral lean/weight shifting;Forward lean/weight shifting Dynamic Standing - Comments: Clothing mgt during toileting Extremity Assessment  RUE Assessment RUE Assessment: Exceptions to [facility] Active Range of Motion (AROM) Comments: ~100 degrees shoulder flexion, ~150 degrees shoulder abduction General Strength Comments: 3+/5 grossly LUE Assessment LUE Assessment: Exceptions to [facility] Active Range of Motion (AROM) Comments: ~100 degrees shoulder flexion, ~150 degrees shoulder abduction General Strength Comments: 4/5 grossly RLE Assessment RLE Assessment: Within Functional Limits General Strength Comments: grossly 4+/5 proximal to distal with delayed activation LLE Assessment LLE Assessment: Exceptions to [facility] General Strength Comments: grossly 4+/5 proximal to distal with delayed activation    Refer to Care Plan for Long Term Goals  Recommendations for other services: Therapeutic Recreation  Outing/community reintegration  Discharge Criteria: Patient will be discharged from PT if  patient refuses treatment 3 consecutive times without medical reason, if treatment goals not met, if there is a change in medical status, if patient makes no progress towards goals or if patient is discharged from hospital.  The above assessment, treatment plan, treatment alternatives and goals were discussed and mutually agreed upon: by patient and by family  Lorie Phenix 07/18/2019, 3:11 PM

## 2019-07-18 NOTE — Evaluation (Signed)
Occupational Therapy Assessment and Plan  Patient Details  Name: Nicholas Hancock MRN: [id number] Date of Birth: 1935-02-24  OT Diagnosis: abnormal posture, apraxia, ataxia, cognitive deficits, hemiplegia affecting dominant side and muscle weakness (generalized) Rehab Potential: Rehab Potential (ACUTE ONLY): Good ELOS: 14-16 days   Today's Date: 07/18/2019 OT Individual Time: 1100-1200 OT Individual Time Calculation (min): 60 min     Problem List:  Patient Active Problem List   Diagnosis Date Noted  . Left basal ganglia embolic stroke ([place]) 70/08/7492  . Atrial tachycardia ([place]) 07/15/2019  . Chronic systolic CHF (congestive heart failure) ([place]) 07/15/2019  . Acute CVA (cerebrovascular accident) ([place]) 07/13/2019  . Hypertension   . Vitamin D deficiency   . Adrenal gland dysfunction ([place])   . Aortic valve insufficiency   . 1st degree AV block   . TIA (transient ischemic attack) 07/12/2019  . 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 Bilateral inguinal hernia-left>right 06/13/2012  . Benign hypertensive heart disease without heart failure 05/22/2011    Past Medical History:  Past Medical History:  Diagnosis Date  . 1st degree AV block   . Adrenal gland dysfunction ([place])    hyperaldesteroa- takes diuretic for over production of aldosterone  . Aortic valve insufficiency   . Arthritis    hands  . CVA (cerebral vascular accident) ([place])   . Elevated PSA    monitored by Dr. Rosana Hoes  . Hypertension    takes diuretic for adrenal gland dysfunction, BP will be high without diuretic  . Vitamin D deficiency    Past Surgical History:  Past Surgical History:  Procedure Laterality Date  . HAND SURGERY  09/21/1987   Enlarged mass on right palm overlying right long finger metacarpophalangeal joint removed  . INGUINAL HERNIA REPAIR  07/15/2012   Procedure: LAPAROSCOPIC BILATERAL INGUINAL HERNIA REPAIR;  Surgeon: Pedro Earls, MD;  Location: [facility];  Service: General;   Laterality: Bilateral;  Laparoscopic Bilateral Inguinal Hernia Repairs with Mesh  . left knee arthroscopy  3 years ago  . PELVIC FRACTURE SURGERY     chippped in high school  . TONSILLECTOMY AND ADENOIDECTOMY      Assessment & Plan Clinical Impression: Nicholas Hancock is an 84 year old right-handed male history of hypertension, adrenal gland dysfunction, prior history of TIA/CVA maintained on aspirin followed by neurology services Dr. Loretta Plume, remote tobacco abuse.  Per chart review lives with spouse independent with assistive device prior to admission.  1 level home.  Patient lives with his wife independent living [place]. Presented 07/12/2019 with aphasia and leaning to the right.  Cranial CT scan negative.  Patient did not receive TPA.  MRI of the brain showed small acute lacunar infarction in the left corona radiata with no associated hemorrhage or mass-effect.  Several small additional acute to subacute infarcts of the bilateral occipital lobes and right frontal lobe.  MRA with no large vessel occlusion.  Echocardiogram with ejection fraction of 40% without emboli.  Admission chemistries unremarkable.  Urine drug screen negative.  SARS coronavirus negative.  Neurology follow-up maintained on aspirin for CVA prophylaxis.  Neurology did not feel patient was a candidate for DAPT given concern for possible CAA.  Tolerating regular diet.  Cardiology service did follow-up for review of low ejection fraction identified on echocardiogram.  Telemetry reviewed showed short burst of atrial tachycardia not consistent with atrial fibrillation or flutter.  Recommendations to start low-dose Toprol/Aldactone as well as continue low-dose aspirin.   Therapy evaluations completed patient was admitted for a comprehensive rehab  program.  Patient currently requires max with basic self-care skills secondary to muscle weakness, decreased cardiorespiratoy endurance, unbalanced muscle activation, motor apraxia,  decreased coordination and decreased motor planning, decreased midline orientation and ideational apraxia, decreased initiation, decreased attention, decreased awareness, decreased problem solving, decreased safety awareness and decreased memory and decreased sitting balance, decreased standing balance, decreased postural control and hemiplegia.  Prior to hospitalization, patient could complete BADLs with independent .  Patient will benefit from skilled intervention to increase independence with basic self-care skills prior to discharge home with wife.  Anticipate patient will require 24 hour supervision and follow up home health.  OT Assessment Rehab Potential (ACUTE ONLY): Good OT Barriers to Discharge: Incontinence;Medical stability;Lack of/limited family support;Behavior OT Patient demonstrates impairments in the following area(s): Balance;Behavior;Cognition;Endurance;Motor;Perception;Safety OT Basic ADL's Functional Problem(s): Eating;Grooming;Bathing;Dressing;Toileting OT Advanced ADL's Functional Problem(s): Simple Meal Preparation OT Transfers Functional Problem(s): Toilet;Tub/Shower OT Additional Impairment(s): None OT Plan OT Intensity: Minimum of 1-2 x/day, 45 to 90 minutes OT Frequency: 5 out of 7 days OT Duration/Estimated Length of Stay: 14-16 days OT Treatment/Interventions: Balance/vestibular training;DME/adaptive equipment instruction;Patient/family education;Therapeutic Activities;Cognitive remediation/compensation;Psychosocial support;Therapeutic Exercise;UE/LE Strength taining/ROM;Self Care/advanced ADL retraining;Functional mobility training;Community reintegration;Discharge planning;Neuromuscular re-education;UE/LE Coordination activities;Disease mangement/prevention;Pain management;Visual/perceptual remediation/compensation OT Self Feeding Anticipated Outcome(s): Supervision OT Basic Self-Care Anticipated Outcome(s): Supervision OT Toileting Anticipated Outcome(s):  Supervision OT Bathroom Transfers Anticipated Outcome(s): Supervision OT Recommendation Patient destination: Home Follow Up Recommendations: Home health OT Equipment Recommended: To be determined Skilled Therapeutic Intervention Skilled OT session completed with focus on initial evaluation, education on OT role/POC, and establishment of patient-centered goals.   Pt greeted in bed with no s/s pain. Spouse [name] was present. Supine<sit completed with Mod-Max A. While EOB, pt presented with very flexed posture and Rt lean. Mod A for dynamic sitting balance when removing gripper socks and washing legs EOB. Manual cuing for upright neutral posture. Mod A for sit<stand and for balance during perihygiene. Max vcs for hand placement when using RW during power up. During hygiene, pt actively having BM. Stand pivot<BSC completed with Mod A using device. Pt continued having large BM and required Total A for hygiene. Max A for standing balance while pt used B UEs to assist with pulling up pants. At end of session pt transferred back to bed in manner as written above. He was repositioned for comfort and left with all needs within reach. Recovered his IV with ACE wrap. Bed alarm set, fall mat placed, and spouse still present.  OT Evaluation Precautions/Restrictions  Precautions Precautions: Fall Restrictions Weight Bearing Restrictions: No General Chart Reviewed: Yes Family/Caregiver Present: Alinda Sierras) Vital Signs Therapy Vitals Pulse Rate: 60 Pain Pain Assessment Pain Scale: 0-10 Pain Score: 0-No pain Home Living/Prior Functioning Home Living Family/patient expects to be discharged to:: Private residence Living Arrangements: Spouse/significant other Available Help at Discharge: Family, Available 24 hours/day Type of Home: House Home Access: Level entry Home Layout: One level Bathroom Shower/Tub: Government social research officer Accessibility: Yes Additional Comments:  Living at [place] with spouse  Lives With: Spouse IADL History Homemaking Responsibilities: No(per spouse) Education: Conservator, museum/gallery Occupation: Retired Type of Occupation: Worked for [employer] Leisure and Hobbies: Was an avid runner. During past few years he and his wife have gone for walks in the park for exercise IADL Comments: Per spouse, pt was using a cane sometimes PTA but wasn't using it properly. No device when completing BADLs Prior Function Level of Independence: Independent with basic ADLs Driving: No(spouse drove) Vocation: Retired ADL ADL Grooming: Maximal  assistance Where Assessed-Grooming: Edge of bed Upper Body Bathing: Supervision/safety Where Assessed-Upper Body Bathing: Edge of bed Lower Body Bathing: Maximal assistance Where Assessed-Lower Body Bathing: Edge of bed Upper Body Dressing: Moderate assistance Where Assessed-Upper Body Dressing: Other (Comment)(sitting on BSC) Lower Body Dressing: Dependent Where Assessed-Lower Body Dressing: Other (Comment)(sitting on BSC) Toileting: Maximal assistance Where Assessed-Toileting: Bedside Commode Toilet Transfer: Moderate assistance Toilet Transfer Method: Stand pivot(RW) Science writer: Radiographer, therapeutic: Not assessed Vision Baseline Vision/History: Wears glasses Wears Glasses: (per spouse, he wears them "on and off." Glasses noted to be missing a lens during session and spouse reported she was going to get new ones) Perception    Praxis Praxis: Impaired Praxis Impairment Details: Initiation;Ideation;Motor planning Cognition Overall Cognitive Status: Impaired/Different from baseline Arousal/Alertness: Awake/alert Orientation Level: Person;Place;Situation(when given option of 2 for "place") Person: Oriented Place: Oriented Situation: Oriented Year: ("1920" when given option of 2) Month: December Day of Week: Incorrect Memory: Impaired Memory Impairment: Decreased  short term memory;Decreased recall of new information Decreased Short Term Memory: Verbal basic;Functional basic Immediate Memory Recall: Sock;Blue;Bed Memory Recall Sock: Not able to recall Memory Recall Blue: Not able to recall Memory Recall Bed: Not able to recall Attention: Sustained Sustained Attention: Impaired Sustained Attention Impairment: Verbal basic;Functional basic Awareness: Impaired Awareness Impairment: Intellectual impairment Problem Solving: Impaired Problem Solving Impairment: Verbal basic;Functional basic Executive Function: (all areas impaired d/t lower level deficits) Safety/Judgment: Impaired Comments: pt frequently attempts to get out of bed Sensation Coordination Gross Motor Movements are Fluid and Coordinated: No Fine Motor Movements are Fluid and Coordinated: No Coordination and Movement Description: Rt hemi Finger Nose Finger Test: Mild ataxia bilaterally Motor  Motor Motor: Abnormal postural alignment and control;Ataxia;Hemiplegia Motor - Skilled Clinical Observations: Rt hemi Mobility    Mod A stand pivot BSC transfer using RW Trunk/Postural Assessment  Cervical Assessment Cervical Assessment: Exceptions to WFL(forward head) Thoracic Assessment Thoracic Assessment: Exceptions to WFL(Kyphotic) Lumbar Assessment Lumbar Assessment: Exceptions to WFL(Posterior pelvic tilt) Postural Control Postural Control: Deficits on evaluation(Rt lean and forward flexed when sitting/standing during functional activity)  Balance Balance Balance Assessed: Yes Dynamic Sitting Balance Dynamic Sitting - Balance Support: No upper extremity supported;During functional activity Dynamic Sitting - Level of Assistance: 3: Mod assist(donning Lt gripper sock) Dynamic Sitting - Balance Activities: Forward lean/weight shifting;Lateral lean/weight shifting Dynamic Standing Balance Dynamic Standing - Balance Support: No upper extremity supported Dynamic Standing - Level of  Assistance: 2: Max assist Dynamic Standing - Balance Activities: Lateral lean/weight shifting;Forward lean/weight shifting Dynamic Standing - Comments: Clothing mgt during toileting Extremity/Trunk Assessment RUE Assessment RUE Assessment: Exceptions to [facility] Active Range of Motion (AROM) Comments: ~100 degrees shoulder flexion, ~150 degrees shoulder abduction General Strength Comments: 3+/5 grossly LUE Assessment LUE Assessment: Exceptions to [facility] Active Range of Motion (AROM) Comments: ~100 degrees shoulder flexion, ~150 degrees shoulder abduction General Strength Comments: 4/5 grossly     Refer to Care Plan for Long Term Goals  Recommendations for other services: None    Discharge Criteria: Patient will be discharged from OT if patient refuses treatment 3 consecutive times without medical reason, if treatment goals not met, if there is a change in medical status, if patient makes no progress towards goals or if patient is discharged from hospital.  The above assessment, treatment plan, treatment alternatives and goals were discussed and mutually agreed upon: by patient and by family  Skeet Simmer 07/18/2019, 12:47 PM

## 2019-07-19 ENCOUNTER — Inpatient Hospital Stay (HOSPITAL_COMMUNITY): Payer: Medicare Other | Admitting: Speech Pathology

## 2019-07-19 MED ORDER — AMOXICILLIN 250 MG PO CAPS
250.0000 mg | ORAL_CAPSULE | Freq: Three times a day (TID) | ORAL | Status: AC
  Administered 2019-07-19 – 2019-07-26 (×21): 250 mg via ORAL
  Filled 2019-07-19 (×21): qty 1

## 2019-07-19 NOTE — Progress Notes (Signed)
[place] PHYSICAL MEDICINE & REHABILITATION PROGRESS NOTE   Subjective/Complaints:  Up in chair , oriented to self, needs cues for place and situation , not oriented to time Cont of bladder with timed toileting today   ROS- neg CP, SOB, N/V/D  Objective:   No results found. Recent Labs    07/17/19 0557  WBC 10.7*  HGB 15.2  HCT 44.5  PLT 172   Recent Labs    07/17/19 0557  NA 142  K 3.6  CL 110  CO2 22  GLUCOSE 125*  BUN 45*  CREATININE 1.19  CALCIUM 8.9    Intake/Output Summary (Last 24 hours) at 07/19/2019 1007 Last data filed at 07/19/2019 0847 Gross per 24 hour  Intake 673 ml  Output -  Net 673 ml     Physical Exam: Vital Signs Blood pressure (!) 138/52, pulse 60, temperature 97.8 F (36.6 C), temperature source Oral, resp. rate 14, SpO2 99 %.   General: No acute distress Mood and affect are appropriate Heart: Regular rate and rhythm no rubs murmurs or extra sounds Lungs: Clear to auscultation, breathing unlabored, no rales or wheezes Abdomen: Positive bowel sounds, soft nontender to palpation, nondistended Extremities: No clubbing, cyanosis, or edema Skin: No evidence of breakdown, no evidence of rash Neurologic: Cranial nerves II through XII intact, motor strength is 5/5 in bilateral deltoid, bicep, tricep, grip, hip flexor, knee extensors, ankle dorsiflexor and plantar flexor Orientation to person only  Sensory exam normal sensation to light touch and proprioception in bilateral upper and lower extremities Cerebellar exam normal finger to nose to finger as well as heel to shin in bilateral upper and lower extremities Musculoskeletal: Full range of motion in all 4 extremities. No joint swelling   Assessment/Plan: 1. Functional deficits secondary to bilateral cortical and subcortical infarcts  which require 3+ hours per day of interdisciplinary therapy in a comprehensive inpatient rehab setting.  Physiatrist is providing close team supervision  and 24 hour management of active medical problems listed below.  Physiatrist and rehab team continue to assess barriers to discharge/monitor patient progress toward functional and medical goals  Care Tool:  Bathing    Body parts bathed by patient: Right arm, Left arm, Chest, Abdomen, Front perineal area, Right upper leg, Left upper leg, Face   Body parts bathed by helper: Buttocks, Right lower leg, Left lower leg     Bathing assist Assist Level: Maximal Assistance - Patient 24 - 49%     Upper Body Dressing/Undressing Upper body dressing   What is the patient wearing?: Pull over shirt    Upper body assist Assist Level: Moderate Assistance - Patient 50 - 74%    Lower Body Dressing/Undressing Lower body dressing      What is the patient wearing?: Incontinence brief, Pants     Lower body assist Assist for lower body dressing: Total Assistance - Patient < 25%     Toileting Toileting    Toileting assist Assist for toileting: Maximal Assistance - Patient 25 - 49%     Transfers Chair/bed transfer  Transfers assist     Chair/bed transfer assist level: Dependent - mechanical lift     Locomotion Ambulation   Ambulation assist      Assist level: Moderate Assistance - Patient 50 - 74% Assistive device: No Device Max distance: 57ft   Walk 10 feet activity   Assist     Assist level: Moderate Assistance - Patient - 50 - 74% Assistive device: No Device   Walk 50  feet activity   Assist Walk 50 feet with 2 turns activity did not occur: Safety/medical concerns         Walk 150 feet activity   Assist Walk 150 feet activity did not occur: Safety/medical concerns         Walk 10 feet on uneven surface  activity   Assist Walk 10 feet on uneven surfaces activity did not occur: Safety/medical concerns         Wheelchair     Assist Will patient use wheelchair at discharge?: No Type of Wheelchair: Manual    Wheelchair assist level: Moderate  Assistance - Patient 50 - 74% Max wheelchair distance: 75    Wheelchair 50 feet with 2 turns activity    Assist        Assist Level: Moderate Assistance - Patient 50 - 74%   Wheelchair 150 feet activity     Assist  Wheelchair 150 feet activity did not occur: Safety/medical concerns       Blood pressure (!) 138/52, pulse 60, temperature 97.8 F (36.6 C), temperature source Oral, resp. rate 14, SpO2 99 %.     Medical Problem List and Plan: 1.  Mild aphasia with decreased functional mobility secondary to small acute left basal ganglia corona radiata and multiple small subacute bilateral occipital and right frontal lobe infarcts ( ?embolic)  as well as history of TIA/CVA 2019 [facility]             -patient may shower             -ELOS/Goals: sup PT, OT, SLP due to cognition  CIR PT, OT , SLP mainly cognitive and balance issues              -currently ambulating 30 feet w/ RW, modA balance and maxA for steering walker. 2.  Antithrombotics: -DVT/anticoagulation: SCDs             -antiplatelet therapy: Aspirin 81 mg daily 3. Pain Management: Tylenol as needed 4. Mood: Provide emotional support             -antipsychotic agents: N/A 5. Neuropsych: This patient is capable of making decisions on his own behalf. 6. Skin/Wound Care: Routine skin checks 7. Fluids/Electrolytes/Nutrition: Routine in and outs with follow-up chemistries 8.  Permissive hypertension.  Toprol-XL 50 mg daily,Cozaar 25 mg daily, Aldactone 50 mg daily.  Monitor with increased mobility Vitals:   07/19/19 0506 07/19/19 0942  BP: (!) 138/52   Pulse: (!) 57 60  Resp: 14   Temp: 97.8 F (36.6 C)   SpO2: 99%    9.  Acute systolic heart failure.compensated   Follow-up per cardiology services.  Continue Aldactone as well as beta-blocker. Echo done on acute side shows EF 35=40% which was decreased from Echo 1 month prior with EF 55-60%. Lung exam normal today  10.  Hyperlipidemia.  Lipitor 11.   Remote tobacco abuse.  Counseling 12.  Enterococcus UTI S to amp 7d course of amox ordered LOS: 2 days A FACE TO FACE EVALUATION WAS PERFORMED  Erick Colace 07/19/2019, 10:07 AM

## 2019-07-19 NOTE — Progress Notes (Signed)
Speech Language Pathology Daily Session Note  Patient Details  Name: Nicholas Hancock MRN: [id number] Date of Birth: [birth year]  Today's Date: 07/19/2019 SLP Individual Time: 1345-1433 SLP Individual Time Calculation (min): 48 min  Short Term Goals: Week 1: SLP Short Term Goal 1 (Week 1): Pt will utilize call bell in 7 out of 10 opportunities with Mod A cues. SLP Short Term Goal 2 (Week 1): Pt will sustain attention to basic familiar task for 3 minutes with Mod A cues. SLP Short Term Goal 3 (Week 1): Given Mod A cues, pt will utilize external memory aids to recall orientation information. SLP Short Term Goal 4 (Week 1): Pt will identify 1 physical and 1 cognitive deficits related to acute CVA in 5 out of 10 attempts with Mod A cues. SLP Short Term Goal 5 (Week 1): Pt will complete basic familiar problem solving tasks with Mod A cues. SLP Short Term Goal 6 (Week 1): Pt will consume current diet with minimal overt s/s of aspiration and Mod A cues for use of compensatory swallow strategies (specifically not talking).  Skilled Therapeutic Interventions: Pt was seen for skilled ST targeting cognitive goals. Pt's wife was present and supportive at bedside throughout session. Pt was oriented to self and situation, however required multiple choice and visual cues to attend to environmental clues in order to orient to place (hospital). Pt then stated he through he was in [place] and required Total A to orient to [place]. Pt able to recall it was "the Christmas month" and a weekend day, however required Max A verbal and visual cues to locate today's date on a December calendar. Extra time was required to allow verbal responses and use of calendar throughout orientation tasks. External aids posted in room to assist with orientation throughout his stay. SLP further facilitated session with Min A verbal cues for error awareness and problem solving during a basic card sorting task (by shape - 4 total). Of  note, pt also exhibited 2-3 instances of word-finding difficulties in conversation and may benefit from further dx tx to determine need for intervention in this area. Pt left laying in bed with alarm set, needs within reach, wife still present. Continue per current plan of care.       Pain Pain Assessment Pain Scale: 0-10 Pain Score: 0-No pain  Therapy/Group: Individual Therapy  Nicholas Hancock 07/19/2019, 2:38 PM

## 2019-07-19 NOTE — Plan of Care (Signed)
  Problem: Consults Goal: RH STROKE PATIENT EDUCATION Description: See Patient Education module for education specifics  Outcome: Progressing   Problem: RH BOWEL ELIMINATION Goal: RH STG MANAGE BOWEL WITH ASSISTANCE Description: STG Manage Bowel with min/mod Assistance. Outcome: Progressing   Problem: RH BLADDER ELIMINATION Goal: RH STG MANAGE BLADDER WITH ASSISTANCE Description: STG Manage Bladder With min/mod Assistance Outcome: Progressing   Problem: RH SKIN INTEGRITY Goal: RH STG SKIN FREE OF INFECTION/BREAKDOWN Description: Patients skin will remain free from further breakdown or injury during rehab stay with min/mod assist. Outcome: Progressing Goal: RH STG MAINTAIN SKIN INTEGRITY WITH ASSISTANCE Description: STG Maintain Skin Integrity With min/mod Assistance. Outcome: Progressing   Problem: RH SAFETY Goal: RH STG ADHERE TO SAFETY PRECAUTIONS W/ASSISTANCE/DEVICE Description: STG Adhere to Safety Precautions With supervision/min Assistance/Device. Outcome: Progressing   Problem: RH COGNITION-NURSING Goal: RH STG USES MEMORY AIDS/STRATEGIES W/ASSIST TO PROBLEM SOLVE Description: STG Uses Memory Aids/Strategies With supervision/ min Assistance to Problem Solve. Outcome: Progressing   Problem: RH KNOWLEDGE DEFICIT Goal: RH STG INCREASE KNOWLEDGE OF HYPERTENSION Description: Patient and caregiver will verbalize understanding of HTN including monitoring, medications, diet, exercise, and follow up care with min assist. Outcome: Progressing Goal: RH STG INCREASE KNOWLEGDE OF HYPERLIPIDEMIA Description: Patient and caregiver will verbalize understanding of HLD including monitoring, medications, diet, exercise, and follow up care with min assist. Outcome: Progressing Goal: RH STG INCREASE KNOWLEDGE OF STROKE PROPHYLAXIS Description: Patient and caregiver will verbalize understanding of stroke prophylaxis including monitoring, medications, diet, exercise, and follow up care with  min assist. Outcome: Progressing

## 2019-07-20 ENCOUNTER — Inpatient Hospital Stay (HOSPITAL_COMMUNITY): Payer: Medicare Other | Admitting: Occupational Therapy

## 2019-07-20 ENCOUNTER — Inpatient Hospital Stay (HOSPITAL_COMMUNITY): Payer: Medicare Other | Admitting: Physical Therapy

## 2019-07-20 ENCOUNTER — Inpatient Hospital Stay (HOSPITAL_COMMUNITY): Payer: Medicare Other | Admitting: Speech Pathology

## 2019-07-20 LAB — CBC WITH DIFFERENTIAL/PLATELET
Abs Immature Granulocytes: 0.03 10*3/uL (ref 0.00–0.07)
Basophils Absolute: 0 10*3/uL (ref 0.0–0.1)
Basophils Relative: 0 %
Eosinophils Absolute: 0.5 10*3/uL (ref 0.0–0.5)
Eosinophils Relative: 6 %
HCT: 39.2 % (ref 39.0–52.0)
Hemoglobin: 13.5 g/dL (ref 13.0–17.0)
Immature Granulocytes: 0 %
Lymphocytes Relative: 18 %
Lymphs Abs: 1.4 10*3/uL (ref 0.7–4.0)
MCH: 30.1 pg (ref 26.0–34.0)
MCHC: 34.4 g/dL (ref 30.0–36.0)
MCV: 87.5 fL (ref 80.0–100.0)
Monocytes Absolute: 0.7 10*3/uL (ref 0.1–1.0)
Monocytes Relative: 10 %
Neutro Abs: 4.9 10*3/uL (ref 1.7–7.7)
Neutrophils Relative %: 66 %
Platelets: 151 10*3/uL (ref 150–400)
RBC: 4.48 MIL/uL (ref 4.22–5.81)
RDW: 12.8 % (ref 11.5–15.5)
WBC: 7.5 10*3/uL (ref 4.0–10.5)
nRBC: 0 % (ref 0.0–0.2)

## 2019-07-20 LAB — COMPREHENSIVE METABOLIC PANEL
ALT: 16 U/L (ref 0–44)
AST: 15 U/L (ref 15–41)
Albumin: 3.1 g/dL — ABNORMAL LOW (ref 3.5–5.0)
Alkaline Phosphatase: 77 U/L (ref 38–126)
Anion gap: 11 (ref 5–15)
BUN: 41 mg/dL — ABNORMAL HIGH (ref 8–23)
CO2: 24 mmol/L (ref 22–32)
Calcium: 8.8 mg/dL — ABNORMAL LOW (ref 8.9–10.3)
Chloride: 109 mmol/L (ref 98–111)
Creatinine, Ser: 1.26 mg/dL — ABNORMAL HIGH (ref 0.61–1.24)
GFR calc Af Amer: >60 mL/min (ref >60)
GFR calc non Af Amer: 53 mL/min — ABNORMAL LOW (ref >60)
Glucose, Bld: 123 mg/dL — ABNORMAL HIGH (ref 70–99)
Potassium: 3.2 mmol/L — ABNORMAL LOW (ref 3.5–5.1)
Sodium: 144 mmol/L (ref 135–145)
Total Bilirubin: 0.7 mg/dL (ref 0.3–1.2)
Total Protein: 6 g/dL — ABNORMAL LOW (ref 6.5–8.1)

## 2019-07-20 MED ORDER — POTASSIUM CHLORIDE 20 MEQ PO PACK
40.0000 meq | PACK | Freq: Two times a day (BID) | ORAL | Status: AC
  Administered 2019-07-20 (×2): 40 meq via ORAL
  Filled 2019-07-20 (×3): qty 2

## 2019-07-20 NOTE — IPOC Note (Signed)
Overall Plan of Care [facility]) Patient Details Name: Nicholas Hancock MRN: [id number] DOB: [birth year]  Admitting Diagnosis: Left basal ganglia embolic stroke [facility])  Hospital Problems: Principal Problem:   Left basal ganglia embolic stroke [facility])     Functional Problem List: Nursing Behavior, Bladder, Bowel, Endurance, Medication Management, Safety, Skin Integrity  PT Balance, Behavior, Endurance, Motor, Nutrition, Perception, Safety, Sensory, Skin Integrity  OT Balance, Behavior, Cognition, Endurance, Motor, Perception, Safety  SLP Cognition, Behavior, Perception, Safety  TR         Basic ADL's: OT Eating, Grooming, Bathing, Dressing, Toileting     Advanced  ADL's: OT Simple Meal Preparation     Transfers: PT Bed Mobility, Bed to Chair, Car, Furniture, Floor  OT Toilet, Research scientist (life sciences): PT Ambulation, Psychologist, prison and probation services, Stairs     Additional Impairments: OT None  SLP Swallowing, Social Cognition, Communication expression Problem Solving, Memory, Attention, Awareness  TR      Anticipated Outcomes Item Anticipated Outcome  Self Feeding Supervision  Swallowing  Mod I   Basic self-care  Marketing executive Transfers Supervision  Bowel/Bladder  Min/mod assist  Transfers  supervision assist with LRAD  Locomotion  ambulatory with LRAD with supervision assist  Communication  Supervision at basic conversation  Cognition  Min A  Pain  < 3  Safety/Judgment  Supervision/min assist   Therapy Plan: PT Intensity: Minimum of 1-2 x/day ,45 to 90 minutes PT Frequency: 5 out of 7 days PT Duration Estimated Length of Stay: 12-16 DAYS OT Intensity: Minimum of 1-2 x/day, 45 to 90 minutes OT Frequency: 5 out of 7 days OT Duration/Estimated Length of Stay: 14-16 days SLP Intensity: Minumum of 1-2 x/day, 30 to 90 minutes SLP Frequency: 3 to 5 out of 7 days SLP Duration/Estimated Length of Stay: 2 weeks   Due to the current state of  emergency, patients may not be receiving their 3-hours of Medicare-mandated therapy.   Team Interventions: Nursing Interventions Patient/Family Education, Bladder Management, Bowel Management, Skin Care/Wound Management, Medication Management, Discharge Planning, Disease Management/Prevention, Cognitive Remediation/Compensation  PT interventions Balance/vestibular training, Cognitive remediation/compensation, Disease management/prevention, Discharge planning, Community reintegration, DME/adaptive equipment instruction, Functional electrical stimulation, Functional mobility training, Patient/family education, Neuromuscular re-education, Pain management, Psychosocial support, Skin care/wound management, Splinting/orthotics, Therapeutic Exercise, Therapeutic Activities, Stair training, UE/LE Strength taining/ROM, UE/LE Coordination activities, Visual/perceptual remediation/compensation, Wheelchair propulsion/positioning, Ambulation/gait training  OT Interventions Warden/ranger, DME/adaptive equipment instruction, Patient/family education, Therapeutic Activities, Cognitive remediation/compensation, Psychosocial support, Therapeutic Exercise, UE/LE Strength taining/ROM, Self Care/advanced ADL retraining, Functional mobility training, Community reintegration, Discharge planning, Neuromuscular re-education, UE/LE Coordination activities, Disease mangement/prevention, Pain management, Visual/perceptual remediation/compensation  SLP Interventions    TR Interventions    SW/CM Interventions Discharge Planning, Psychosocial Support, Patient/Family Education   Barriers to Discharge MD  Cognitive status; need for supervision at home  Nursing Incontinence, Behavior numerous falls at home over the last year  PT Decreased caregiver support, Insurance for SNF coverage    OT Incontinence, Medical stability, Lack of/limited family support, Behavior    SLP Lack of/limited family support    SW        Team Discharge Planning: Destination: PT-Home ,OT- Home , SLP-Skilled Nursing Facility (SNF) Projected Follow-up: PT-Home health PT, OT-  Home health OT, SLP-Skilled Nursing facility, 24 hour supervision/assistance Projected Equipment Needs: PT-To be determined, OT- To be determined, SLP-None recommended by SLP Equipment Details: PT- , OT-  Patient/family involved in discharge planning: PT- Patient,  OT-Family member/caregiver, SLP-Patient unable/family or  caregive not available  MD ELOS: To be determined. Estimated 12/18  Medical Rehab Prognosis:  Good Assessment: [name] is progressing well with therapy and his wife has been engaged with sessions. Continues to have deficits in orientation, word-finding, error awareness, problem solving, weakness, ataxia, and coordination.     See Team Conference Notes for weekly updates to the plan of care

## 2019-07-20 NOTE — Progress Notes (Signed)
Speech Language Pathology Daily Session Note  Patient Details  Name: Nicholas Hancock MRN: [id number] Date of Birth: Nov 20, 1934  Today's Date: 07/20/2019 SLP Individual Time: 17-1445 SLP Individual Time Calculation (min): 60 min  Short Term Goals: Week 1: SLP Short Term Goal 1 (Week 1): Pt will utilize call bell in 7 out of 10 opportunities with Mod A cues. SLP Short Term Goal 2 (Week 1): Pt will sustain attention to basic familiar task for 3 minutes with Mod A cues. SLP Short Term Goal 3 (Week 1): Given Mod A cues, pt will utilize external memory aids to recall orientation information. SLP Short Term Goal 4 (Week 1): Pt will identify 1 physical and 1 cognitive deficits related to acute CVA in 5 out of 10 attempts with Mod A cues. SLP Short Term Goal 5 (Week 1): Pt will complete basic familiar problem solving tasks with Mod A cues. SLP Short Term Goal 6 (Week 1): Pt will consume current diet with minimal overt s/s of aspiration and Mod A cues for use of compensatory swallow strategies (specifically not talking).  Skilled Therapeutic Interventions: Pt was seen for skilled ST targeting dysphagia and cognitive goals. During skilled observation of pt consuming thin H2O, pt exhibited immediate or delayed cough in 70% of trials. Presence of absence of straw to deliver thin bolus did not appear to influence whether or not it resulted in a cough. Pt's wife at bedside reported pt frequently clears throat and/or coughs during PO intake at baseline. ST will continue to monitor tolerance of regular/thin diet and potential need for instrumental MBSS. Recommend continue current diet for now. Pt was independently oriented to self, place (hospital), city, and situation (stroke), however Mod A verbal and visual cues were required for pt to orient to month and date on a calendar. SLP further facilitated session with overall Mod A verbal and visual cues for error awareness and problem solving during a basic 3-step  action card sequencing task. Throughout task, pt did exhibit word finding difficulties in describing actions and objects in the pictures. Per pt's wife's report, word finding difficulties are consistent with baseline language functioning, therefore no word-finding goal will be initiated. Pt was left sitting in wheelchair with seatbelt alarm in place and all needs within reach. Continue per current plan of care.        Pain Pain Assessment Pain Scale: 0-10 Pain Score: 0-No pain  Therapy/Group: Individual Therapy  Arbutus Leas 07/20/2019, 2:54 PM

## 2019-07-20 NOTE — Progress Notes (Signed)
Social Work Assessment and Plan   Patient Details  Name: Nicholas Hancock MRN: [id number] Date of Birth: [birth year]  Today's Date: 07/20/2019  Problem List:  Patient Active Problem List   Diagnosis Date Noted  . Left basal ganglia embolic stroke ([place]) 60/63/0160  . Atrial tachycardia ([place]) 07/15/2019  . Chronic systolic CHF (congestive heart failure) ([place]) 07/15/2019  . Acute CVA (cerebrovascular accident) ([place]) 07/13/2019  . Hypertension   . Vitamin D deficiency   . Adrenal gland dysfunction ([place])   . Aortic valve insufficiency   . 1st degree AV block   . TIA (transient ischemic attack) 07/12/2019  . 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 Bilateral inguinal hernia-left>right 06/13/2012  . Benign hypertensive heart disease without heart failure 05/22/2011   Past Medical History:  Past Medical History:  Diagnosis Date  . 1st degree AV block   . Adrenal gland dysfunction ([place])    hyperaldesteroa- takes diuretic for over production of aldosterone  . Aortic valve insufficiency   . Arthritis    hands  . CVA (cerebral vascular accident) ([place])   . Elevated PSA    monitored by Dr. Rosana Hoes  . Hypertension    takes diuretic for adrenal gland dysfunction, BP will be high without diuretic  . Vitamin D deficiency    Past Surgical History:  Past Surgical History:  Procedure Laterality Date  . HAND SURGERY  09/21/1987   Enlarged mass on right palm overlying right long finger metacarpophalangeal joint removed  . INGUINAL HERNIA REPAIR  07/15/2012   Procedure: LAPAROSCOPIC BILATERAL INGUINAL HERNIA REPAIR;  Surgeon: Pedro Earls, MD;  Location: [facility] ORS;  Service: General;  Laterality: Bilateral;  Laparoscopic Bilateral Inguinal Hernia Repairs with Mesh  . left knee arthroscopy  3 years ago  . PELVIC FRACTURE SURGERY     chippped in high school  . TONSILLECTOMY AND ADENOIDECTOMY     Social History:  reports that he quit smoking about 34 years ago. His smoking use included  pipe. He quit after 20.00 years of use. He has never used smokeless tobacco. He reports current alcohol use. He reports that he does not use drugs.  Family / Support Systems Marital Status: Married Patient Roles: Spouse, Parent Spouse/Significant Other: [name] [phone]-cell Children: Both have children-this is a second Armed forces training and education officer are local Other Supports: Friends Anticipated Caregiver: Wife Ability/Limitations of Caregiver: Supevision-light min assist-wife is small and elderly also Caregiver Availability: 24/7 Family Dynamics: Close knit with all of their children-three are local and some are out of state, but involved. They have their support and a few friends. Recently moved to [place].  Social History Preferred language: English Religion: None Cultural Background: No issues Education: Some College Read: Yes Write: Yes Employment Status: Retired Public relations account executive Issues: No issues Guardian/Conservator: None-according to MD pt is capable of making his own decisions while here, will make sure wife is included due pt does have memory issues.   Abuse/Neglect Abuse/Neglect Assessment Can Be Completed: Yes Physical Abuse: Denies Verbal Abuse: Denies Sexual Abuse: Denies Exploitation of patient/patient's resources: Denies Self-Neglect: Denies  Emotional Status Pt's affect, behavior and adjustment status: Pt wants to get better and back to where he was, using a cane and walking daily with wife. He did need some assist when bathing with the water and could not drive anymore. Wife helped with these things and is the driver now. Recent Psychosocial Issues: past CVA caused balance issues but got better, was falling at home prior to admission Psychiatric History: No issues-does have  cognitive issues from past CVA. Seems to be adjusting well to the rehab unit and is willing to work in therapies. Will see if will need to be seen by neuro-psych while here Substance Abuse  History: No issues  Patient / Family Perceptions, Expectations & Goals Pt/Family understanding of illness & functional limitations: Pt and wife can explain his stroke. Wife has a better understanding than pt of his deficits and the plan for his treatment here. She plans to be here daily and see him in therapies to see his progress while here. Premorbid pt/family roles/activities: Husband, father, grandfather, retiree, friend, etc Anticipated changes in roles/activities/participation: resume Pt/family expectations/goals: Pt states: " I hope to do well here."   Wife states: " I hope he can learn all he can here and have his balance be better when he goes home."  [employer]: Other (Comment)(recently moved to [place]) Premorbid Home Care/DME Agencies: Other (Comment)(has rw and cane) Transportation available at discharge: Wife drivew now pt needed to quit driving for safety Resource referrals recommended: Support group (specify)  Discharge Planning Living Arrangements: Spouse/significant other Support Systems: Spouse/significant other, Children, Friends/neighbors, Church/faith community Type of Residence: Independent Living Insurance Resources: Media planner (specify)(UHC_Medicare) Financial Resources: Restaurant manager, fast food Screen Referred: No Living Expenses: Rent Money Management: Spouse Does the patient have any problems obtaining your medications?: No Home Management: Wife Patient/Family Preliminary Plans: Return to apartment with wife who has been assisting him prior to admission with driving and bathing and providing supervision. She is hopeful his balance will get better here and he will not fall as much at home. Will work on discharge needs. Social Work Anticipated Follow Up Needs: HH/OP, Support Group  Clinical Impression Pleasantly confused gentleman who is willing to participate in therapies and work to get his balance better. His  wife is supportive and has been assisting him prior to [place] stroke since he had deficits from last stroke. She can do supervision to light min but can not do physical assist. Will work on discharge needs and encourage wife to be here often to see him in therapies, so she is able to cue him once he goes home from rehab.  Lucy Chris 07/20/2019, 11:44 AM

## 2019-07-20 NOTE — Care Management Note (Signed)
[facility] Individual Statement of Services  Patient Name:  Nicholas Hancock  Date:  07/20/2019  Welcome to the [facility].  Our goal is to provide you with an individualized program based on your diagnosis and situation, designed to meet your specific needs.  With this comprehensive rehabilitation program, you will be expected to participate in at least 3 hours of rehabilitation therapies Monday-Friday, with modified therapy programming on the weekends.  Your rehabilitation program will include the following services:  Physical Therapy (PT), Occupational Therapy (OT), Speech Therapy (ST), 24 hour per day rehabilitation nursing, Neuropsychology, Case Management (Social Worker), Rehabilitation Medicine, Nutrition Services and Pharmacy Services  Weekly team conferences will be held on Wednesday to discuss your progress.  Your Social Worker will talk with you frequently to get your input and to update you on team discussions.  Team conferences with you and your family in attendance may also be held.  Expected length of stay: 12-16 days Overall anticipated outcome: supervision with cueing  Depending on your progress and recovery, your program may change. Your Social Worker will coordinate services and will keep you informed of any changes. Your Social Worker's name and contact numbers are listed  below.  The following services may also be recommended but are not provided by the [facility]:    [place] will be made to provide these services after discharge if needed.  Arrangements include referral to agencies that provide these services.  Your insurance has been verified to be:  UHC-Medicare Your primary doctor is:  Reynold Bowen  Pertinent information will be shared with your doctor and your insurance company.  Social Worker:  Ovidio Kin, [place] or [phone]  Information discussed with and copy given to patient by: Elease Hashimoto, 07/20/2019, 9:47 AM

## 2019-07-20 NOTE — Progress Notes (Signed)
Inpatient Rehabilitation  Patient information reviewed and entered into eRehab system by Zachary Nole M. French Kendra, M.A., CCC/SLP, PPS Coordinator.  Information including medical coding, functional ability and quality indicators will be reviewed and updated through discharge.    

## 2019-07-20 NOTE — Progress Notes (Signed)
Occupational Therapy Session Note  Patient Details  Name: Nicholas Hancock MRN: [id number] Date of Birth: [birth year]  Today's Date: 07/20/2019 OT Individual Time: 6433-2951 OT Individual Time Calculation (min): 59 min   Short Term Goals: Week 1:  OT Short Term Goal 1 (Week 1): Pt will complete toileting tasks with Mod A OT Short Term Goal 2 (Week 1): Pt will complete 1 grooming task at the sink while standing with no more than Mod A for balance OT Short Term Goal 3 (Week 1): Pt will don overhead shirt with Min A  Skilled Therapeutic Interventions/Progress Updates:    Pt greeted in w/c with no s/s pain. Declining toileting but agreeable to get washed up. Supine<sit completed with Mod A to elevate trunk. Mod A for stand pivot<w/c using RW with vcs for hand placement. Note that when pt stands he exhibits increased hip and knee flexion with kyphotic posture, slow shuffling steps when transferring. During bathing tasks pt required mod vcs for sequencing. Unable to utilize figure 4 with R LE due to hip tightness. Pt able to tolerate this position with Lt given Min A to wash and dress LB. Mod A for sit<stands at sink with vcs for hand placement on w/c vs pulling up with sink. Pts hip flexion steadily increased as he attempted to pull up pants using both hands, and ultimately needed assist for safety. Pt stood to complete oral care and hand washing. Min A for standing balance with unilateral support on sink, Mod A when both hands were engaged in activity. Provided tactile, verbal, and manual cues to straighten Rt knee due to buckling. He was able to use the mirror given vcs to obtain a more erect posture for brief windows of time. Total A for Teds and footwear, though pt able to attempt to don Lt sneaker using figure 4 position. At end of session pt returned to bed via stand pivot once again. He boosted himself up using headboard when bed was placed in trendelenburg position. Left him with all needs within  reach and bed alarm set. Tx focus placed on ADL retraining, balance, and activity tolerance.    Therapy Documentation Precautions:  Precautions Precautions: Fall Restrictions Weight Bearing Restrictions: No Vital Signs:  Pain: Pain Assessment Pain Scale: 0-10 Pain Score: 0-No pain ADL: ADL Grooming: Maximal assistance Where Assessed-Grooming: Edge of bed Upper Body Bathing: Supervision/safety Where Assessed-Upper Body Bathing: Edge of bed Lower Body Bathing: Maximal assistance Where Assessed-Lower Body Bathing: Edge of bed Upper Body Dressing: Moderate assistance Where Assessed-Upper Body Dressing: Other (Comment)(sitting on BSC) Lower Body Dressing: Dependent Where Assessed-Lower Body Dressing: Other (Comment)(sitting on BSC) Toileting: Maximal assistance Where Assessed-Toileting: Bedside Commode Toilet Transfer: Moderate assistance Toilet Transfer Method: Stand pivot(RW) Toilet Transfer Equipment: Engineer, technical sales Transfer: Not assessed  Therapy/Group: Individual Therapy  Keymiah Lyles A Jansen Sciuto 07/20/2019, 12:26 PM

## 2019-07-20 NOTE — Progress Notes (Signed)
[place] PHYSICAL MEDICINE & REHABILITATION PROGRESS NOTE   Subjective/Complaints:  Sitting up in bed eating. He has no complaints. Denies pain, constipation. Sleeping well at night, K+ 3.2 today. Cr 1.26  ROS- neg CP, SOB, N/V/D  Objective:   No results found. Recent Labs    07/20/19 0647  WBC 7.5  HGB 13.5  HCT 39.2  PLT 151   Recent Labs    07/20/19 0647  NA 144  K 3.2*  CL 109  CO2 24  GLUCOSE 123*  BUN 41*  CREATININE 1.26*  CALCIUM 8.8*    Intake/Output Summary (Last 24 hours) at 07/20/2019 1034 Last data filed at 07/20/2019 0835 Gross per 24 hour  Intake 590 ml  Output 200 ml  Net 390 ml     Physical Exam: Vital Signs Blood pressure (!) 140/52, pulse (!) 55, temperature 98.1 F (36.7 C), temperature source Oral, resp. rate 20, SpO2 96 %.   General: No acute distress, sitting up in bed, eating.  Mood and affect are appropriate Heart: Regular rate and rhythm no rubs murmurs or extra sounds Lungs: Clear to auscultation, breathing unlabored, no rales or wheezes Abdomen: Positive bowel sounds, soft nontender to palpation, nondistended Extremities: No clubbing, cyanosis, or edema Skin: No evidence of breakdown, no evidence of rash Neurologic: Cranial nerves II through XII intact, motor strength is 5/5 in bilateral deltoid, bicep, tricep, grip, hip flexor, knee extensors, ankle dorsiflexor and plantar flexor Orientation to person only  Sensory exam normal sensation to light touch and proprioception in bilateral upper and lower extremities Cerebellar exam normal finger to nose to finger as well as heel to shin in bilateral upper and lower extremities Musculoskeletal: Full range of motion in all 4 extremities. No joint swelling   Assessment/Plan: 1. Functional deficits secondary to bilateral cortical and subcortical infarcts  which require 3+ hours per day of interdisciplinary therapy in a comprehensive inpatient rehab setting.  Physiatrist is providing  close team supervision and 24 hour management of active medical problems listed below.  Physiatrist and rehab team continue to assess barriers to discharge/monitor patient progress toward functional and medical goals  Care Tool:  Bathing    Body parts bathed by patient: Face, Right arm, Left arm, Right upper leg, Left upper leg, Front perineal area, Chest, Abdomen   Body parts bathed by helper: Buttocks, Right lower leg, Left lower leg     Bathing assist Assist Level: Moderate Assistance - Patient 50 - 74%     Upper Body Dressing/Undressing Upper body dressing   What is the patient wearing?: Pull over shirt    Upper body assist Assist Level: Minimal Assistance - Patient > 75%    Lower Body Dressing/Undressing Lower body dressing      What is the patient wearing?: Pants, Incontinence brief     Lower body assist Assist for lower body dressing: Maximal Assistance - Patient 25 - 49%     Toileting Toileting Toileting Activity did not occur Press photographer and hygiene only): Refused  Toileting assist Assist for toileting: Minimal Assistance - Patient > 75%     Transfers Chair/bed transfer  Transfers assist     Chair/bed transfer assist level: Minimal Assistance - Patient > 75%     Locomotion Ambulation   Ambulation assist      Assist level: Moderate Assistance - Patient 50 - 74% Assistive device: No Device Max distance: 24ft   Walk 10 feet activity   Assist     Assist level: Moderate Assistance -  Patient - 50 - 74% Assistive device: No Device   Walk 50 feet activity   Assist Walk 50 feet with 2 turns activity did not occur: Safety/medical concerns         Walk 150 feet activity   Assist Walk 150 feet activity did not occur: Safety/medical concerns         Walk 10 feet on uneven surface  activity   Assist Walk 10 feet on uneven surfaces activity did not occur: Safety/medical concerns         Wheelchair     Assist Will  patient use wheelchair at discharge?: No Type of Wheelchair: Manual    Wheelchair assist level: Moderate Assistance - Patient 50 - 74% Max wheelchair distance: 75    Wheelchair 50 feet with 2 turns activity    Assist        Assist Level: Moderate Assistance - Patient 50 - 74%   Wheelchair 150 feet activity     Assist  Wheelchair 150 feet activity did not occur: Safety/medical concerns       Blood pressure (!) 140/52, pulse (!) 55, temperature 98.1 F (36.7 C), temperature source Oral, resp. rate 20, SpO2 96 %.     Medical Problem List and Plan: 1.  Mild aphasia with decreased functional mobility secondary to small acute left basal ganglia corona radiata and multiple small subacute bilateral occipital and right frontal lobe infarcts ( ?embolic)  as well as history of TIA/CVA 2019 followed by [facility]             -patient may shower             -ELOS/Goals: sup PT, OT, SLP due to cognition   CIR PT, OT , SLP mainly cognitive and balance issues  2.  Antithrombotics: -DVT/anticoagulation: SCDs             -antiplatelet therapy: Aspirin 81 mg daily 3. Pain Management: Tylenol as needed 4. Mood: Provide emotional support             -antipsychotic agents: N/A 5. Neuropsych: This patient is capable of making decisions on his own behalf. 6. Skin/Wound Care: Routine skin checks 7. Fluids/Electrolytes/Nutrition: Routine in and outs with follow-up chemistries  K+ 3.2 on 12/7--will supplement with 18meq BID for two doses and repeat BMP tomorrow.  8.  Permissive hypertension.  Toprol-XL 50 mg daily,Cozaar 25 mg daily, Aldactone 50 mg daily.  Monitor with increased mobility Vitals:   07/19/19 1955 07/20/19 0400  BP: (!) 139/53 (!) 140/52  Pulse: (!) 57 (!) 55  Resp: 18 20  Temp: 98.3 F (36.8 C) 98.1 F (36.7 C)  SpO2: 97% 96%   Stable on 12/7 9.  Acute systolic heart failure.compensated   Follow-up per cardiology services.  Continue Aldactone as well as beta-blocker.  Echo done on acute side shows EF 35=40% which was decreased from Echo 1 month prior with EF 55-60%. Lung exam normal today  10.  Hyperlipidemia.  Lipitor 11.  Remote tobacco abuse.  Counseling 12.  Enterococcus UTI S to amp 7d course of amox ordered LOS: 3 days A FACE TO FACE EVALUATION WAS PERFORMED  Martha Clan P Geonna Lockyer 07/20/2019, 10:34 AM

## 2019-07-20 NOTE — Progress Notes (Signed)
Physical Therapy Session Note  Patient Details  Name: Nicholas Hancock MRN: [id number] Date of Birth: Nov 10, 1934  Today's Date: 07/20/2019 PT Individual Time: 1030-1130 and 1500-1530 PT Individual Time Calculation (min): 60 min and 30 min Short Term Goals: Week 1:  PT Short Term Goal 1 (Week 1): Pt will trasnfer to [facility] with min assist consistently PT Short Term Goal 2 (Week 1): Pt will ambulate 180f with min assist and LRAD PT Short Term Goal 3 (Week 1): Pt will be able to maintian dynamic balance with min assist up to 3 min. PT Short Term Goal 4 (Week 1): pt will perform bed mobility with supervision assist  Skilled Therapeutic Interventions/Progress Updates: Tx1: Pt presented in bed agreeable to therapy. Performed supine to sit minA with use of bed features. Pt performed stand pivot transfer to w/c minA overall. Pt transported to rehab gym and performed stand pivot to mat in same manner as prior. Participated in STS x 5 with RW with cues for hand placement then progressed to STS no AD for BLE strengthening and with mirror feedback for achieving and maintaining midline orientation. Participated in toe taps x 10 bilaterally with PTA providing tactile cues for increasing R quad activation. Pt then performed alternating toe taps x 10 bilaterally with pt noting to have improved quad activation with repetitions. Participated in x 1 round of horseshoes no AD for forced use of RUE and emphasis on reaching up and crossing midline to stretch R trunk.  Pt ambulated 331f with RW with modA and mod multimodal cues for increasing R step length, increasing R abduction, and activating R quad for increased TKE. Pt transported back to room at end of session and agreeable to remain in w/c until lunchtime. Pt left with call bell within reach, belt alarm, and current needs met.   Tx2: Pt presented in w/c agreeable to therapy. Pt transported to day room to participate in gait training. As pt stood PTA noted pt with bowel  incontinence. Pt transported back to room and performed stand pivot transfer to toilet with wall rail and minA for sequencing. Pt was able to stand with wall rail support x 5 min while PTA performed peri-care. Pt stated never felt need for BM however wife noted that pt did attempt to stand x 1 and stated he felt he had gas. Once peri-completed PTA performed clothing management maxA. Performed stand pivot transfer back to w/c with max multimodal cues and increased time. Once out of bathroom pt performed ambulatory transfer with RW to bed with max multimodal cues for sequencing. Pt was able to return to supine from flat bed with minA. Pt repositioned to comfort and left with bed alarm on, call bell within reach and needs met.      Therapy Documentation Precautions:  Precautions Precautions: Fall Restrictions Weight Bearing Restrictions: No General:   Vital Signs:  Pain: Pain Assessment Pain Scale: 0-10 Pain Score: 0-No pain  Therapy/Group: Individual Therapy  Cherree Conerly  Darielle Hancher, PTA  07/20/2019, 1:05 PM

## 2019-07-21 ENCOUNTER — Inpatient Hospital Stay (HOSPITAL_COMMUNITY): Payer: Medicare Other | Admitting: Physical Therapy

## 2019-07-21 ENCOUNTER — Inpatient Hospital Stay (HOSPITAL_COMMUNITY): Payer: Medicare Other | Admitting: Occupational Therapy

## 2019-07-21 ENCOUNTER — Inpatient Hospital Stay (HOSPITAL_COMMUNITY): Payer: Medicare Other | Admitting: Speech Pathology

## 2019-07-21 LAB — BASIC METABOLIC PANEL
Anion gap: 6 (ref 5–15)
BUN: 38 mg/dL — ABNORMAL HIGH (ref 8–23)
CO2: 25 mmol/L (ref 22–32)
Calcium: 8.4 mg/dL — ABNORMAL LOW (ref 8.9–10.3)
Chloride: 110 mmol/L (ref 98–111)
Creatinine, Ser: 1.19 mg/dL (ref 0.61–1.24)
GFR calc Af Amer: >60 mL/min (ref >60)
GFR calc non Af Amer: 57 mL/min — ABNORMAL LOW (ref >60)
Glucose, Bld: 118 mg/dL — ABNORMAL HIGH (ref 70–99)
Potassium: 3.5 mmol/L (ref 3.5–5.1)
Sodium: 141 mmol/L (ref 135–145)

## 2019-07-21 MED ORDER — SPIRONOLACTONE 25 MG PO TABS
50.0000 mg | ORAL_TABLET | Freq: Every day | ORAL | Status: DC
  Administered 2019-07-22 – 2019-08-03 (×13): 50 mg via ORAL
  Filled 2019-07-21 (×14): qty 2

## 2019-07-21 NOTE — Progress Notes (Signed)
Occupational Therapy Session Note  Patient Details  Name: Nicholas Hancock MRN: [id number] Date of Birth: 11-28-1934  Today's Date: 07/21/2019 OT Individual Time: 0800-0910 OT Individual Time Calculation (min): 70 min    Short Term Goals: Week 1:  OT Short Term Goal 1 (Week 1): Pt will complete toileting tasks with Mod A OT Short Term Goal 2 (Week 1): Pt will complete 1 grooming task at the sink while standing with no more than Mod A for balance OT Short Term Goal 3 (Week 1): Pt will don overhead shirt with Min A  Skilled Therapeutic Interventions/Progress Updates:    Upon entering the room, pt in bed with NT providing supervision for meal. Pt has food all over himself and in bed and is agreeable to transfer into wheelchair to sit up to finish meal. Supine >sit with mod A to EOB. Pt's with significant posterior LOB requiring mod A when attempting to pull pants on. Pt needing max A for LB dressing. Pt standing from bed with mod A and mod multimodal cuing for sequencing and hand placement. Pt with posterior bias while standing with  Mod A for stand pivot transfer to the L into wheelchair. Pt reports he is currently at home and is unable to tell therapist why he is at hospital. Pt with 1 bout of coughing while finishing meal but likely do to pt being distracted and attempting to talk to staff within the room. Pt engaged in grooming tasks while seated at sink with use of B UEs to apply shaving cream, shave face, wash face, and brush teeth with min A overall. Pt does appear to be pocketing food. Pt doffing pull over shirt with min A and donning clean shift with min A and cuing for hemiplegic technique. Pt needing increased time to complete all tasks this session. Pt remained in wheelchair with call bell and all needed items within reach upon exiting the room. Chair alarm belt activated.   Therapy Documentation Precautions:  Precautions Precautions: Fall Restrictions Weight Bearing Restrictions:  No   Pain: Pain Assessment Pain Scale: 0-10 Pain Score: 0-No pain ADL: ADL Grooming: Maximal assistance Where Assessed-Grooming: Edge of bed Upper Body Bathing: Supervision/safety Where Assessed-Upper Body Bathing: Edge of bed Lower Body Bathing: Maximal assistance Where Assessed-Lower Body Bathing: Edge of bed Upper Body Dressing: Moderate assistance Where Assessed-Upper Body Dressing: Other (Comment)(sitting on BSC) Lower Body Dressing: Dependent Where Assessed-Lower Body Dressing: Other (Comment)(sitting on BSC) Toileting: Maximal assistance Where Assessed-Toileting: Bedside Commode Toilet Transfer: Moderate assistance Toilet Transfer Method: Stand pivot(RW) Toilet Transfer Equipment: Engineer, technical sales Transfer: Not assessed   Therapy/Group: Individual Therapy  Gypsy Decant 07/21/2019, 11:48 AM

## 2019-07-21 NOTE — Progress Notes (Signed)
[place] PHYSICAL MEDICINE & REHABILITATION PROGRESS NOTE   Subjective/Complaints:  Talks with food in his mouth, poor awareness of situation   ROS- neg CP, SOB, N/V/D  Objective:   No results found. Recent Labs    07/20/19 0647  WBC 7.5  HGB 13.5  HCT 39.2  PLT 151   Recent Labs    07/20/19 0647 07/21/19 0544  NA 144 141  K 3.2* 3.5  CL 109 110  CO2 24 25  GLUCOSE 123* 118*  BUN 41* 38*  CREATININE 1.26* 1.19  CALCIUM 8.8* 8.4*    Intake/Output Summary (Last 24 hours) at 07/21/2019 8099 Last data filed at 07/21/2019 0700 Gross per 24 hour  Intake 487 ml  Output -  Net 487 ml     Physical Exam: Vital Signs Blood pressure (!) 166/57, pulse (!) 56, temperature (!) 97.3 F (36.3 C), resp. rate 14, SpO2 99 %.   General: No acute distress, sitting up in bed, eating.  Mood and affect are appropriate Heart: Regular rate and rhythm no rubs murmurs or extra sounds Lungs: Clear to auscultation, breathing unlabored, no rales or wheezes Abdomen: Positive bowel sounds, soft nontender to palpation, nondistended Extremities: No clubbing, cyanosis, or edema Skin: No evidence of breakdown, no evidence of rash Neurologic: Cranial nerves II through XII intact, motor strength is 5/5 in bilateral deltoid, bicep, tricep, grip, hip flexor, knee extensors, ankle dorsiflexor and plantar flexor Orientation to person only " I'm  at home" Sensory exam normal sensation to light touch and proprioception in bilateral upper and lower extremities Cerebellar exam normal finger to nose to finger as well as heel to shin in bilateral upper and lower extremities Musculoskeletal: Full range of motion in all 4 extremities. No joint swelling   Assessment/Plan: 1. Functional deficits secondary to bilateral cortical and subcortical infarcts  which require 3+ hours per day of interdisciplinary therapy in a comprehensive inpatient rehab setting.  Physiatrist is providing close team supervision  and 24 hour management of active medical problems listed below.  Physiatrist and rehab team continue to assess barriers to discharge/monitor patient progress toward functional and medical goals  Care Tool:  Bathing    Body parts bathed by patient: Face   Body parts bathed by helper: Buttocks, Right lower leg, Left lower leg     Bathing assist Assist Level: Moderate Assistance - Patient 50 - 74%     Upper Body Dressing/Undressing Upper body dressing   What is the patient wearing?: Pull over shirt    Upper body assist Assist Level: Minimal Assistance - Patient > 75%    Lower Body Dressing/Undressing Lower body dressing      What is the patient wearing?: Pants, Incontinence brief     Lower body assist Assist for lower body dressing: Maximal Assistance - Patient 25 - 49%     Toileting Toileting Toileting Activity did not occur Landscape architect and hygiene only): Refused  Toileting assist Assist for toileting: Minimal Assistance - Patient > 75%     Transfers Chair/bed transfer  Transfers assist     Chair/bed transfer assist level: Minimal Assistance - Patient > 75%     Locomotion Ambulation   Ambulation assist      Assist level: Moderate Assistance - Patient 50 - 74% Assistive device: No Device Max distance: 34ft   Walk 10 feet activity   Assist     Assist level: Moderate Assistance - Patient - 50 - 74% Assistive device: No Device   Walk 50 feet  activity   Assist Walk 50 feet with 2 turns activity did not occur: Safety/medical concerns         Walk 150 feet activity   Assist Walk 150 feet activity did not occur: Safety/medical concerns         Walk 10 feet on uneven surface  activity   Assist Walk 10 feet on uneven surfaces activity did not occur: Safety/medical concerns         Wheelchair     Assist Will patient use wheelchair at discharge?: No Type of Wheelchair: Manual    Wheelchair assist level: Moderate  Assistance - Patient 50 - 74% Max wheelchair distance: 75    Wheelchair 50 feet with 2 turns activity    Assist        Assist Level: Moderate Assistance - Patient 50 - 74%   Wheelchair 150 feet activity     Assist  Wheelchair 150 feet activity did not occur: Safety/medical concerns       Blood pressure (!) 166/57, pulse (!) 56, temperature (!) 97.3 F (36.3 C), resp. rate 14, SpO2 99 %.     Medical Problem List and Plan: 1.  Mild aphasia with decreased functional mobility secondary to small acute left basal ganglia corona radiata and multiple small subacute bilateral occipital and right frontal lobe infarcts ( ?embolic)  as well as history of TIA/CVA 2019 followed by [facility]             -patient may shower             -ELOS/Goals: sup PT, OT, SLP due to cognition   CIR PT, OT , SLP mainly cognitive and balance issues  2.  Antithrombotics: -DVT/anticoagulation: SCDs             -antiplatelet therapy: Aspirin 81 mg daily 3. Pain Management: Tylenol as needed 4. Mood: Provide emotional support             -antipsychotic agents: N/A 5. Neuropsych: This patient is capable of making decisions on his own behalf. 6. Skin/Wound Care: Routine skin checks 7. Fluids/Electrolytes/Nutrition: Routine in and outs with follow-up chemistries  K+ 3.2 on 12/7--will supplement with BID for two doses and repeat BMP up to 3.5 8.  Permissive hypertension.  Toprol-XL 50 mg daily,Cozaar 25 mg daily, Aldactone 50 mg daily.  Monitor with increased mobility Vitals:   07/20/19 2127 07/21/19 0431  BP: (!) 163/65 (!) 166/57  Pulse: (!) 59 (!) 56  Resp: 16 14  Temp: (!) 97.3 F (36.3 C) (!) 97.3 F (36.3 C)  SpO2: 94% 99%   Elevated systolic will increase aldactone to home dose  9.  Acute systolic heart failure.compensated   Follow-up per cardiology services.  Continue Aldactone as well as beta-blocker. Echo done on acute side shows EF 35=40% which was decreased from Echo 1 month  prior with EF 55-60%. Lung exam normal today  10.  Hyperlipidemia.  Lipitor 11.  Remote tobacco abuse.  Counseling 12.  Enterococcus UTI S to amp 7d course of amox ordered LOS: 4 days A FACE TO FACE EVALUATION WAS PERFORMED  Erick Colace 07/21/2019, 8:08 AM

## 2019-07-21 NOTE — Progress Notes (Signed)
Speech Language Pathology Daily Session Note  Patient Details  Name: Nicholas Hancock MRN: [id number] Date of Birth: 1934/11/15  Today's Date: 07/21/2019 SLP Individual Time: 1330-1430 SLP Individual Time Calculation (min): 60 min  Short Term Goals: Week 1: SLP Short Term Goal 1 (Week 1): Pt will utilize call bell in 7 out of 10 opportunities with Mod A cues. SLP Short Term Goal 2 (Week 1): Pt will sustain attention to basic familiar task for 3 minutes with Mod A cues. SLP Short Term Goal 3 (Week 1): Given Mod A cues, pt will utilize external memory aids to recall orientation information. SLP Short Term Goal 4 (Week 1): Pt will identify 1 physical and 1 cognitive deficits related to acute CVA in 5 out of 10 attempts with Mod A cues. SLP Short Term Goal 5 (Week 1): Pt will complete basic familiar problem solving tasks with Mod A cues. SLP Short Term Goal 6 (Week 1): Pt will consume current diet with minimal overt s/s of aspiration and Mod A cues for use of compensatory swallow strategies (specifically not talking).  Skilled Therapeutic Interventions: Pt was seen for skilled ST targeting dysphagia and cognitive goals. During skilled observation of pt consuming thin liquids, he exhibited 1 immediate throat clear. Recommend continue current diet. SLP further facilitated session with Max A verbal and visual cues during a basic money management task - counting and displaying sets amount of change (ALFA). Decreased Mod A required for sorting coins by type. Min A verbal cues for redirection to task in a quiet controlled environment were provided throughout session. Pt required extra time to complete all tasks and appeared to become increasingly fatigued throughout session. Continue per current plan of care.       Pain Pain Assessment Pain Scale: 0-10 Pain Score: 0-No pain  Therapy/Group: Individual Therapy  Arbutus Leas 07/21/2019, 3:16 PM

## 2019-07-21 NOTE — Progress Notes (Signed)
Physical Therapy Session Note  Patient Details  Name: Nicholas Hancock MRN: [id number] Date of Birth: 1934/12/20  Today's Date: 07/21/2019 PT Individual Time: 1003-1100 PT Individual Time Calculation (min): 57 min   Short Term Goals: Week 1:  PT Short Term Goal 1 (Week 1): Pt will trasnfer to [facility] with min assist consistently PT Short Term Goal 2 (Week 1): Pt will ambulate 134f with min assist and LRAD PT Short Term Goal 3 (Week 1): Pt will be able to maintian dynamic balance with min assist up to 3 min. PT Short Term Goal 4 (Week 1): pt will perform bed mobility with supervision assist  Skilled Therapeutic Interventions/Progress Updates: Pt presented in w/c agreeable to therapy. Pt denies pain during session. Pt transported to dayroom for energy conservation. Pt performed stand pivot transfer to NuStep with minA with verbal cues for sequencing. Participated in NuStep L1 x 5 min for reciprocal activity and global conditioning with PTA proving mod multimodal cues for using full range of NuStep. Pt then ambulated to mat and participated in STS no AD x 5 with mirror feedback for midline orientation. Performed toe taps to target with PTA placing visual aides on floor to encourage increased step length. Pt was able to place foot on markers to step forward however required mod cues to return foot to original position (with visual aides in place). Participated in round of cornhole without AD with emphasis on reaching up and to L for increased trunk extension and rotation.Pt ambulated approx 70 ft with RW and minA with mod/max cues for safety with RW and increasing step length. Pt returned to w/c and transported back to room at end of session. Pt remained in w/c at end of session and belt alarm on, call bell within reach and needs met.      Therapy Documentation Precautions:  Precautions Precautions: Fall Restrictions Weight Bearing Restrictions: No General:   Vital Signs: Therapy Vitals Temp: 97.7  F (36.5 C) Pulse Rate: 60 Resp: 14 BP: (!) 147/60 Patient Position (if appropriate): Sitting Oxygen Therapy SpO2: 100 % O2 Device: Room Air Pain: Pain Assessment Pain Scale: 0-10 Pain Score: 0-No pain Mobility:   Locomotion :    Trunk/Postural Assessment :    Balance:   Exercises:   Other Treatments:      Therapy/Group: Individual Therapy  Harald Quevedo 07/21/2019, 4:25 PM

## 2019-07-22 ENCOUNTER — Inpatient Hospital Stay (HOSPITAL_COMMUNITY): Payer: Medicare Other | Admitting: Speech Pathology

## 2019-07-22 ENCOUNTER — Inpatient Hospital Stay (HOSPITAL_COMMUNITY): Payer: Medicare Other | Admitting: Physical Therapy

## 2019-07-22 ENCOUNTER — Inpatient Hospital Stay (HOSPITAL_COMMUNITY): Payer: Medicare Other

## 2019-07-22 NOTE — Progress Notes (Signed)
Occupational Therapy Session Note  Patient Details  Name: Nicholas Hancock MRN: [id number] Date of Birth: Jun 08, 1935  Today's Date: 07/22/2019 OT Individual Time: 5498-2641 OT Individual Time Calculation (min): 60 min    Short Term Goals: Week 1:  OT Short Term Goal 1 (Week 1): Pt will complete toileting tasks with Mod A OT Short Term Goal 2 (Week 1): Pt will complete 1 grooming task at the sink while standing with no more than Mod A for balance OT Short Term Goal 3 (Week 1): Pt will don overhead shirt with Min A  Skilled Therapeutic Interventions/Progress Updates:    Pt received sitting up in his w/c with his wife present. No c/o pain. Pt agreeable to shower. Pt completed stand pivot transfer into the shower with mod cueing for UE placement, with mod A. Min A to doff pants distally. Pt with kyphotic posture throughout shower and R lean but no LOB. Pt washed UB with cueing for using wet and soapy rag- initially washing with a dry washcloth. Pt able to wash all body parts with mod A in standing when completing peri areas. Mod A to transfer out of shower. Pt impulsively standing several times during shower as well. Pt donned shirt with CGA. Mod A to don pants. Max A to don socks and shoes 2/2 deficits in distally reaching. Pt required assistance to turn on electric toothbrush. Pt's speech was slurred throughout session, as well as often very confused. Pt was left sitting up in the w/c with chair alarm belt fastened and wife present.   Therapy Documentation Precautions:  Precautions Precautions: Fall Restrictions Weight Bearing Restrictions: No   Therapy/Group: Individual Therapy  Curtis Sites 07/22/2019, 12:32 PM

## 2019-07-22 NOTE — Progress Notes (Signed)
[place] PHYSICAL MEDICINE & REHABILITATION PROGRESS NOTE   Subjective/Complaints:  No issues overnite , remains confused   ROS- neg CP, SOB, N/V/D  Objective:   No results found. Recent Labs    07/20/19 0647  WBC 7.5  HGB 13.5  HCT 39.2  PLT 151   Recent Labs    07/20/19 0647 07/21/19 0544  NA 144 141  K 3.2* 3.5  CL 109 110  CO2 24 25  GLUCOSE 123* 118*  BUN 41* 38*  CREATININE 1.26* 1.19  CALCIUM 8.8* 8.4*    Intake/Output Summary (Last 24 hours) at 07/22/2019 0908 Last data filed at 07/22/2019 0600 Gross per 24 hour  Intake 356 ml  Output 250 ml  Net 106 ml     Physical Exam: Vital Signs Blood pressure (!) 172/61, pulse 60, temperature 98.1 F (36.7 C), resp. rate 18, SpO2 98 %.   General: No acute distress, sitting up in bed, eating.  Mood and affect are appropriate Heart: Regular rate and rhythm no rubs murmurs or extra sounds Lungs: Clear to auscultation, breathing unlabored, no rales or wheezes Abdomen: Positive bowel sounds, soft nontender to palpation, nondistended Extremities: No clubbing, cyanosis, or edema Skin: No evidence of breakdown, no evidence of rash Neurologic: Cranial nerves II through XII intact, motor strength is 5/5 in bilateral deltoid, bicep, tricep, grip, hip flexor, knee extensors, ankle dorsiflexor and plantar flexor Orientation to person only " I'm  at home" Sensory exam normal sensation to light touch and proprioception in bilateral upper and lower extremities Cerebellar exam normal finger to nose to finger as well as heel to shin in bilateral upper and lower extremities Musculoskeletal: Full range of motion in all 4 extremities. No joint swelling   Assessment/Plan: 1. Functional deficits secondary to bilateral cortical and subcortical infarcts  which require 3+ hours per day of interdisciplinary therapy in a comprehensive inpatient rehab setting.  Physiatrist is providing close team supervision and 24 hour management  of active medical problems listed below.  Physiatrist and rehab team continue to assess barriers to discharge/monitor patient progress toward functional and medical goals  Care Tool:  Bathing    Body parts bathed by patient: Face   Body parts bathed by helper: Buttocks, Right lower leg, Left lower leg     Bathing assist Assist Level: Moderate Assistance - Patient 50 - 74%     Upper Body Dressing/Undressing Upper body dressing   What is the patient wearing?: Pull over shirt    Upper body assist Assist Level: Minimal Assistance - Patient > 75%    Lower Body Dressing/Undressing Lower body dressing      What is the patient wearing?: Pants     Lower body assist Assist for lower body dressing: Maximal Assistance - Patient 25 - 49%     Toileting Toileting Toileting Activity did not occur Press photographer and hygiene only): Refused  Toileting assist Assist for toileting: Minimal Assistance - Patient > 75%     Transfers Chair/bed transfer  Transfers assist     Chair/bed transfer assist level: Moderate Assistance - Patient 50 - 74%     Locomotion Ambulation   Ambulation assist      Assist level: Moderate Assistance - Patient 50 - 74% Assistive device: No Device Max distance: 52ft   Walk 10 feet activity   Assist     Assist level: Moderate Assistance - Patient - 50 - 74% Assistive device: No Device   Walk 50 feet activity   Assist Walk 50  feet with 2 turns activity did not occur: Safety/medical concerns         Walk 150 feet activity   Assist Walk 150 feet activity did not occur: Safety/medical concerns         Walk 10 feet on uneven surface  activity   Assist Walk 10 feet on uneven surfaces activity did not occur: Safety/medical concerns         Wheelchair     Assist Will patient use wheelchair at discharge?: No Type of Wheelchair: Manual    Wheelchair assist level: Moderate Assistance - Patient 50 - 74% Max  wheelchair distance: 75    Wheelchair 50 feet with 2 turns activity    Assist        Assist Level: Moderate Assistance - Patient 50 - 74%   Wheelchair 150 feet activity     Assist  Wheelchair 150 feet activity did not occur: Safety/medical concerns       Blood pressure (!) 172/61, pulse 60, temperature 98.1 F (36.7 C), resp. rate 18, SpO2 98 %.     Medical Problem List and Plan: 1.  Mild aphasia with decreased functional mobility secondary to small acute left basal ganglia corona radiata and multiple small subacute bilateral occipital and right frontal lobe infarcts ( ?embolic)  as well as history of TIA/CVA 2019 followed [facility]             -patient may shower             -ELOS/Goals: sup PT, OT, SLP due to cognition   CIR PT, OT , SLP mainly cognitive and balance issues  2.  Antithrombotics: -DVT/anticoagulation: SCDs             -antiplatelet therapy: Aspirin 81 mg daily 3. Pain Management: Tylenol as needed 4. Mood: Provide emotional support             -antipsychotic agents: N/A 5. Neuropsych: This patient is capable of making decisions on his own behalf. 6. Skin/Wound Care: Routine skin checks 7. Fluids/Electrolytes/Nutrition: Routine in and outs with follow-up chemistries  K+ 3.2 on 12/7--will supplement with 76meq BID for two doses and repeat BMP up to 3.5, increased aldactone should help preserve K+ 8.  Permissive hypertension.  Toprol-XL 50 mg daily,Cozaar 25 mg daily, Aldactone 50 mg daily.  Monitor with increased mobility Vitals:   07/21/19 2006 07/22/19 0322  BP: (!) 172/60 (!) 172/61  Pulse: (!) 56 60  Resp: 18 18  Temp: 98 F (36.7 C) 98.1 F (36.7 C)  SpO2: 97% 98%   Elevated systolic will increase aldactone to home dose started 50mg  on 12/9 9.  Acute systolic heart failure.compensated   Follow-up per cardiology services.  Continue Aldactone as well as beta-blocker. Echo done on acute side shows EF 35=40% which was decreased from Echo 1  month prior with EF 55-60%. Lung exam normal today  10.  Hyperlipidemia.  Lipitor 11.  Remote tobacco abuse.  Counseling 12.  Enterococcus UTI S to amp 7d course of amox ordered, through 12/13 LOS: 5 days A FACE TO [place] [facility] Nicholas Hancock 07/22/2019, 9:08 AM

## 2019-07-22 NOTE — Progress Notes (Signed)
Social Work Patient ID: Nicholas Hancock, male   DOB: 12-25-1934, 84 y.o.   MRN: [id number]  Met with pt and wife to discuss team conference goals supervision level and target discharge 12/21. Wife plans to reach out to see if can hire a sitter to be with pt when she goes to the beauty shop. She will talk with [name] and let worker know if needs assist with this. Made aware will have follow up therapies at discharge. Wife is here daily and see's pt in therapies. Will continue to work toward discharge 12/21.

## 2019-07-22 NOTE — Progress Notes (Signed)
Speech Language Pathology Daily Session Note  Patient Details  Name: Nicholas Hancock MRN: [id number] Date of Birth: [birth year]  Today's Date: 07/22/2019 SLP Individual Time: 6384-6659 SLP Individual Time Calculation (min): 46 min  Short Term Goals: Week 1: SLP Short Term Goal 1 (Week 1): Pt will utilize call bell in 7 out of 10 opportunities with Mod A cues. SLP Short Term Goal 2 (Week 1): Pt will sustain attention to basic familiar task for 3 minutes with Mod A cues. SLP Short Term Goal 3 (Week 1): Given Mod A cues, pt will utilize external memory aids to recall orientation information. SLP Short Term Goal 4 (Week 1): Pt will identify 1 physical and 1 cognitive deficits related to acute CVA in 5 out of 10 attempts with Mod A cues. SLP Short Term Goal 5 (Week 1): Pt will complete basic familiar problem solving tasks with Mod A cues. SLP Short Term Goal 6 (Week 1): Pt will consume current diet with minimal overt s/s of aspiration and Mod A cues for use of compensatory swallow strategies (specifically not talking).  Skilled Therapeutic Interventions: Pt was seen for skilled ST targeting dysphagia and speech intelligibility. Pt required Mod A verbal cues to cease verbosity during intake of graham cracker and coffee snack. He demonstrated immediate cough X1 and wet vocal qualtiy X1 following sips of thin coffee. Recommend continue current diet. Pt was difficult to engage in structured picture descriptions tasks due to internal distractions regarding family members. However, during some structured as well as informal conversation, pt required Mod A verbal cues to intentionally implement increased vocal intensity and overarticulation to achieve ~85% intelligibility. Pt was left sitting in wheelchair with seatbelt alarm in place and all needs within reach, wife present. Continue per current plan of care.         Pain Pain Assessment Pain Scale: Faces Pain Score: 0-No pain Faces Pain Scale: No  hurt  Therapy/Group: Individual Therapy  Arbutus Leas 07/22/2019, 3:00 PM

## 2019-07-22 NOTE — Patient Care Conference (Signed)
Inpatient RehabilitationTeam Conference and Plan of Care Update Date: 07/22/2019   Time: 10:05 AM   Patient Name: Nicholas Hancock      Medical Record Number: [id number]  Date of Birth: April 08, 1935 Sex: Male         Room/Bed: 4W19C/4W19C-01 Payor Info: Payor: [place] / Plan: [facility] MEDICARE / Product Type: *No Product type* /    Admit Date/Time:  07/17/2019 11:51 AM  Primary Diagnosis:  Left basal ganglia embolic stroke [facility])  Patient Active Problem List   Diagnosis Date Noted  . Left basal ganglia embolic stroke ([place]) 93/23/5573  . Atrial tachycardia ([place]) 07/15/2019  . Chronic systolic CHF (congestive heart failure) ([place]) 07/15/2019  . Acute CVA (cerebrovascular accident) ([place]) 07/13/2019  . Hypertension   . Vitamin D deficiency   . 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 Aortic valve insufficiency   . 1st degree AV block   . TIA (transient ischemic attack) 07/12/2019  . 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 Bilateral inguinal hernia-left>right 06/13/2012  . Benign hypertensive heart disease without heart failure 05/22/2011    Expected Discharge Date: Expected Discharge Date: 08/03/19  Team Members Present: Physician leading conference: Dr. Alysia Penna Social Worker Present: Ovidio Kin, LCSW Nurse Present: Judee Clara, LPN Case Manager: Karene Fry, RN PT Present: Barrie Folk, PT OT Present: Darleen Crocker, OT SLP Present: Jettie Booze, CF-SLP PPS Coordinator present : Gunnar Fusi, SLP     Current Status/Progress Goal Weekly Team Focus  Bowel/Bladder   Pt continent at times, LBM 07/21/19  Time toileting  Assess toileting q shift and prn   Swallow/Nutrition/ Hydration   Reg/thin, Min A  Mod I  carryover swallow strategies, tolerance reg/thin, education ongoing with wife   ADL's   Mod A stand pivot BSC transfer using RW, Min A UB self care, Max A LB self care, Max A toileting  Supervision  Functional transfers, sitting/standing balance,  ADL retraining, midline orientation, postural control, NMR   Mobility   min to modA bed mobility, minA STS, modA stand pivot, modA gait, poor midline orientation with R lean  supervision assist  NMR, gait, transfers, safety awareness, d/c planning   Communication   Mod A speech intellgibility 80% conversation  Supervision A  carryover speech intelligibiltiy strategies, education ongoing with wife   Safety/Cognition/ Behavioral Observations  Max A basic tasks  Min A  Basic problems solving, sustained attention, short term recall, education is ongoing with wife   Pain   No c/o pain, has prns  Remain pain free  Assess pain q shift and prn   Skin   Rash on back  Prevent further skin breakdown  Assess skin q shift and prn      *See Care Plan and progress notes for long and short-term goals.     Barriers to Discharge  Current Status/Progress Possible Resolutions Date Resolved   Nursing                  PT                    OT                  SLP                SW                Discharge Planning/Teaching Needs:  Home with wife who was assisting some prior to admission with bathing and dressing. Wife  is small and elderly so will need him to reach supervision level goals set      Team Discussion: L BG CVA, several infarcts, elevated BP, adjusting meds, K+ low.  RN - disoriented at first, stood up, inc episodes.  OT mod A transfers, max LB dressing, Mod UB dressing, disoriented, confused, goals S.  PT mornings hard, mod A transfers, the CGA/min, amb 100' walker CGA, max cues, goals S.  SLP on reg, thins, coughs, clears throat, working on speech inteligibility, max A basic cognition, cog goals min A, others S.  Home with wife.   Revisions to Treatment Plan: N/A     Medical Summary Current Status: remains confused , no significant hemiparesis Weekly Focus/Goal: HTN management increase Aldosterone  Barriers to Discharge: Medical stability   Possible Resolutions to Barriers: cont  rehab   Continued Need for Acute Rehabilitation Level of Care: The patient requires daily medical management by a physician with specialized training in physical medicine and rehabilitation for the following reasons: Direction of a multidisciplinary physical rehabilitation program to maximize functional independence : Yes Medical management of patient stability for increased activity during participation in an intensive rehabilitation regime.: Yes Analysis of laboratory values and/or radiology reports with any subsequent need for medication adjustment and/or medical intervention. : Yes   I attest that I was present, lead the team conference, and concur with the assessment and plan of the team.   Trish Mage 07/22/2019, 9:29 PM  Team conference was held via web/ teleconference due to COVID - 19

## 2019-07-22 NOTE — Progress Notes (Addendum)
Speech Language Pathology Daily Session Note  Patient Details  Name: Nicholas Hancock MRN: [id number] Date of Birth: 12-03-1934  Today's Date: 07/22/2019 SLP Individual Time: 0175-1025 SLP Individual Time Calculation (min): 58 min  Short Term Goals: Week 1: SLP Short Term Goal 1 (Week 1): Pt will utilize call bell in 7 out of 10 opportunities with Mod A cues. SLP Short Term Goal 2 (Week 1): Pt will sustain attention to basic familiar task for 3 minutes with Mod A cues. SLP Short Term Goal 3 (Week 1): Given Mod A cues, pt will utilize external memory aids to recall orientation information. SLP Short Term Goal 4 (Week 1): Pt will identify 1 physical and 1 cognitive deficits related to acute CVA in 5 out of 10 attempts with Mod A cues. SLP Short Term Goal 5 (Week 1): Pt will complete basic familiar problem solving tasks with Mod A cues. SLP Short Term Goal 6 (Week 1): Pt will consume current diet with minimal overt s/s of aspiration and Mod A cues for use of compensatory swallow strategies (specifically not talking).  Skilled Therapeutic Interventions: Pt was seen for skilled ST targeting cognitive goals. Provided Min A verbal cues, pt used signage in room to orient to place and situation. He was independently oriented to month, however required Mod A verbal and visual cues to identify today's date on the calendar. Pt was more internally distracted this morning in comparison to previous sessions, requiring Mod A verbal cues for redirection to tasks. He sustained attention for 3-4 minute intervals at this level of assist. SLP further facilitated session with Max faded to Mod A (3/4 way through activity) verbal and visual cues for basic problem solving, error awareness, and consistent Max A verbal and visual cues for sequencing and organization when matching colored blocks to a 9-color pattern board. Pt continues to require extra time for processing and completion of tasks. He demonstrated some insight  into level of difficulty presented by color block matching activity, evidenced in his statement "this is a lot at one time." Pt's wife arrived in last 15 minutes of session. Pt was left sitting in wheelchair with seatbelt alarm in place, call bell within reach and wife still present. Continue per current plan of care.       Pain Pain Assessment Pain Scale: 0-10 Pain Score: 0-No pain  Therapy/Group: Individual Therapy  Arbutus Leas 07/22/2019, 12:01 PM

## 2019-07-22 NOTE — Progress Notes (Signed)
Physical Therapy Session Note  Patient Details  Name: Nicholas Hancock MRN: [id number] Date of Birth: 06-Mar-1935  Today's Date: 07/22/2019 PT Individual Time: 0800-0857 AND 1700-1730 PT Individual Time Calculation (min): 57 min and 30 min   Short Term Goals: Week 1:  PT Short Term Goal 1 (Week 1): Pt will trasnfer to [facility] with min assist consistently PT Short Term Goal 2 (Week 1): Pt will ambulate 127f with min assist and LRAD PT Short Term Goal 3 (Week 1): Pt will be able to maintian dynamic balance with min assist up to 3 min. PT Short Term Goal 4 (Week 1): pt will perform bed mobility with supervision assist  Skilled Therapeutic Interventions/Progress Updates:  Session 1  Pt received supine in bed and agreeable to PT. Pt eating breakfast with RN present. Pt instructed in sustained attention task to complete breakfast, mod-max cues for attention to task and improved safety to reduce communication while complete oral phase of each bite. PT required to reduced multiple distractions including to turn off TV and close door to allow full attention to breakfast.   Supine>sit transfer with mod assist and max cues for sequencing to use log roll to come to EOB with very poor carryover and apraxic movement pattern.   [facility] shoulder for mod assist to prevent posterior LOB. And max cues for awareness of WC on the R side.   WC mobility x 108fwith min assist to improve attention to task and maintain straight path.   [facility] instructed by PT to improve anterior weighty shift and improved sustained attention to task. Pt able to complete 2 bouts of low difficulty peg board puzzle x 18 BUE. 1 UE supported on table throughout and min-CGA for improved anterior weight shifting.   Gait training with RW x 10036fith minA-CGA for safety and max cues for AD management, upright posture and safety in turns.   Patient  returned to room and [facility] reach and all needs met.   Session 2  Pt received sitting in WC and agreeable to PT. [facility] to nustep with RW with CGA. nustep reciprocal movement training x 8 min  with min-mod assist for full ROM on the RLE as well as moderate cues for improved attention to task.   Gait training with RW x [id number] CGA and moderate cues for improved AD management. Almost no carryover for safety with use of RW and to improve upright posture. Cues also provided by PT for increased step length with mild improvement with increased distance and repetitive instruction from PT.  Sit<>stand x 5 [facility] min assist overall, 2 performed with BUE support on RW and then RW removed and performed with push from WC. [facility]change in assist needed with and without AD this afternoon.   Patient returned to room and [facility] and all needs met.          Therapy Documentation Precautions:  Precautions Precautions: Fall Restrictions Weight Bearing Restrictions: No  Pain: denies   Therapy/Group: Individual Therapy  AustiLorie Phenix/2020, 8:58 AM

## 2019-07-23 ENCOUNTER — Inpatient Hospital Stay (HOSPITAL_COMMUNITY): Payer: Medicare Other | Admitting: Occupational Therapy

## 2019-07-23 ENCOUNTER — Inpatient Hospital Stay (HOSPITAL_COMMUNITY): Payer: Medicare Other | Admitting: Speech Pathology

## 2019-07-23 ENCOUNTER — Inpatient Hospital Stay (HOSPITAL_COMMUNITY): Payer: Medicare Other | Admitting: Physical Therapy

## 2019-07-23 LAB — GLUCOSE, CAPILLARY: Glucose-Capillary: 128 mg/dL — ABNORMAL HIGH (ref 70–99)

## 2019-07-23 NOTE — Progress Notes (Signed)
Speech Language Pathology Daily Session Note  Patient Details  Name: Nicholas Hancock MRN: [id number] Date of Birth: [birth year]  Today's Date: 07/23/2019 SLP Individual Time: 1400-1500 SLP Individual Time Calculation (min): 60 min  Short Term Goals: Week 1: SLP Short Term Goal 1 (Week 1): Pt will utilize call bell in 7 out of 10 opportunities with Mod A cues. SLP Short Term Goal 2 (Week 1): Pt will sustain attention to basic familiar task for 3 minutes with Mod A cues. SLP Short Term Goal 3 (Week 1): Given Mod A cues, pt will utilize external memory aids to recall orientation information. SLP Short Term Goal 4 (Week 1): Pt will identify 1 physical and 1 cognitive deficits related to acute CVA in 5 out of 10 attempts with Mod A cues. SLP Short Term Goal 5 (Week 1): Pt will complete basic familiar problem solving tasks with Mod A cues. SLP Short Term Goal 6 (Week 1): Pt will consume current diet with minimal overt s/s of aspiration and Mod A cues for use of compensatory swallow strategies (specifically not talking).  Skilled Therapeutic Interventions: Pt was seen for skilled ST targeting cognitive skills. Pt attended to signage posted in room to orient to place and situation Mod I, however required Min A cues to orient to city, Max A verbal and visual cues to orient to month and date on a calendar. SLP further facilitated session with overall Mod A verbal and visual cues for problem solving and error awareness to sequence numbers on a large print December calendar. Mod A verbal and visual cues also provided for redirection to tasks throughout session in a quiet environment. During a novel basic card task, pt required Max A verbal and visual cues to identify 2 cards that equal 11. Pt left sitting in wheelchair with seatbelt alarm in place, needs within reach, and wife still present. Continue per current plan of care.     Pain Pain Assessment Pain Scale: 0-10 Pain Score: 0-No pain  Therapy/Group:  Individual Therapy  Arbutus Leas 07/23/2019, 3:03 PM

## 2019-07-23 NOTE — Progress Notes (Signed)
[place] PHYSICAL MEDICINE & REHABILITATION PROGRESS NOTE   Subjective/Complaints:  No issues overnite , bright and alert , ready for breakfast   ROS- neg CP, SOB, N/V/D  Objective:   No results found. No results for input(s): WBC, HGB, HCT, PLT in the last 72 hours. Recent Labs    07/21/19 0544  NA 141  K 3.5  CL 110  CO2 25  GLUCOSE 118*  BUN 38*  CREATININE 1.19  CALCIUM 8.4*    Intake/Output Summary (Last 24 hours) at 07/23/2019 0817 Last data filed at 07/23/2019 0500 Gross per 24 hour  Intake 480 ml  Output 350 ml  Net 130 ml     Physical Exam: Vital Signs Blood pressure (!) 158/52, pulse (!) 57, temperature 98.1 F (36.7 C), temperature source Oral, resp. rate 16, SpO2 95 %.   General: No acute distress, sitting up in bed, eating.  Mood and affect are appropriate Heart: Regular rate and rhythm no rubs murmurs or extra sounds Lungs: Clear to auscultation, breathing unlabored, no rales or wheezes Abdomen: Positive bowel sounds, soft nontender to palpation, nondistended Extremities: No clubbing, cyanosis, or edema Skin: No evidence of breakdown, no evidence of rash Neurologic: Cranial nerves II through XII intact, motor strength is 5/5 in bilateral deltoid, bicep, tricep, grip, hip flexor, knee extensors, ankle dorsiflexor and plantar flexor Orientation to person only " I'm  at home" Sensory exam normal sensation to light touch and proprioception in bilateral upper and lower extremities Cerebellar exam normal finger to nose to finger as well as heel to shin in bilateral upper and lower extremities Musculoskeletal: Full range of motion in all 4 extremities. No joint swelling   Assessment/Plan: 1. Functional deficits secondary to bilateral cortical and subcortical infarcts  which require 3+ hours per day of interdisciplinary therapy in a comprehensive inpatient rehab setting.  Physiatrist is providing close team supervision and 24 hour management of active  medical problems listed below.  Physiatrist and rehab team continue to assess barriers to discharge/monitor patient progress toward functional and medical goals  Care Tool:  Bathing    Body parts bathed by patient: Right arm, Left arm, Chest, Abdomen, Front perineal area, Buttocks, Right upper leg, Right lower leg, Left lower leg, Face, Left upper leg   Body parts bathed by helper: Buttocks, Right lower leg, Left lower leg     Bathing assist Assist Level: Moderate Assistance - Patient 50 - 74%     Upper Body Dressing/Undressing Upper body dressing   What is the patient wearing?: Pull over shirt    Upper body assist Assist Level: Contact Guard/Touching assist    Lower Body Dressing/Undressing Lower body dressing      What is the patient wearing?: Pants     Lower body assist Assist for lower body dressing: Moderate Assistance - Patient 50 - 74%     Toileting Toileting Toileting Activity did not occur Landscape architect and hygiene only): Refused  Toileting assist Assist for toileting: Minimal Assistance - Patient > 75%     Transfers Chair/bed transfer  Transfers assist     Chair/bed transfer assist level: Moderate Assistance - Patient 50 - 74%     Locomotion Ambulation   Ambulation assist      Assist level: Minimal Assistance - Patient > 75% Assistive device: Walker-rolling Max distance: 100   Walk 10 feet activity   Assist     Assist level: Contact Guard/Touching assist Assistive device: No Device   Walk 50 feet activity  Assist Walk 50 feet with 2 turns activity did not occur: Safety/medical concerns  Assist level: Minimal Assistance - Patient > 75%      Walk 150 feet activity   Assist Walk 150 feet activity did not occur: Safety/medical concerns         Walk 10 feet on uneven surface  activity   Assist Walk 10 feet on uneven surfaces activity did not occur: Safety/medical concerns         Wheelchair     Assist  Will patient use wheelchair at discharge?: No Type of Wheelchair: Manual    Wheelchair assist level: Minimal Assistance - Patient > 75% Max wheelchair distance: 100    Wheelchair 50 feet with 2 turns activity    Assist        Assist Level: Minimal Assistance - Patient > 75%   Wheelchair 150 feet activity     Assist  Wheelchair 150 feet activity did not occur: Safety/medical concerns       Blood pressure (!) 158/52, pulse (!) 57, temperature 98.1 F (36.7 C), temperature source Oral, resp. rate 16, SpO2 95 %.     Medical Problem List and Plan: 1.  Mild aphasia with decreased functional mobility secondary to small acute left basal ganglia corona radiata and multiple small subacute bilateral occipital and right frontal lobe infarcts ( ?embolic)  as well as history of TIA/CVA 2019 followed by [facility]             -patient may shower             -ELOS/Goals: sup PT, OT, SLP due to cognition   CIR PT, OT , SLP mainly cognitive and balance issues  2.  Antithrombotics: -DVT/anticoagulation: SCDs             -antiplatelet therapy: Aspirin 81 mg daily 3. Pain Management: Tylenol as needed 4. Mood: Provide emotional support             -antipsychotic agents: N/A 5. Neuropsych: This patient is capable of making decisions on his own behalf. 6. Skin/Wound Care: Routine skin checks 7. Fluids/Electrolytes/Nutrition: Routine in and outs with follow-up chemistries  K+ 3.2 on 12/7--will supplement with BID for two doses and repeat BMP up to 3.5, increased aldactone should help preserve K+, BUN running higher than baseline  8.  Permissive hypertension.  Toprol-XL 50 mg daily,Cozaar 25 mg daily, Aldactone 50 mg daily.  Monitor with increased mobility Vitals:   07/22/19 1925 07/23/19 0525  BP: (!) 164/50 (!) 158/52  Pulse: 63 (!) 57  Resp: 16 16  Temp: 97.8 F (36.6 C) 98.1 F (36.7 C)  SpO2: 100% 95%   Elevated systolic will increase aldactone to home dose started 50mg   on 12/9, sysolics running 10-20 mmHg lower today  9.  Acute systolic heart failure.compensated   Follow-up per cardiology services.  Continue Aldactone as well as beta-blocker. Echo done on acute side shows EF 35=40% which was decreased from Echo 1 month prior with EF 55-60%. Lung exam normal today  10.  Hyperlipidemia.  Lipitor 11.  Remote tobacco abuse.  Counseling 12.  Enterococcus UTI S to amp 7d course of amox ordered, through 12/13 LOS: 6 days A FACE TO FACE EVALUATION WAS PERFORMED  1/14 07/23/2019, 8:17 AM

## 2019-07-23 NOTE — Progress Notes (Signed)
Physical Therapy Session Note  Patient Details  Name: Nicholas Hancock MRN: [id number] Date of Birth: [birth year]  Today's Date: 07/23/2019 PT Individual Time: 1105-1200 PT Individual Time Calculation (min): 55 min   Short Term Goals: Week 1:  PT Short Term Goal 1 (Week 1): Pt will trasnfer to [facility] with min assist consistently PT Short Term Goal 2 (Week 1): Pt will ambulate 182f with min assist and LRAD PT Short Term Goal 3 (Week 1): Pt will be able to maintian dynamic balance with min assist up to 3 min. PT Short Term Goal 4 (Week 1): pt will perform bed mobility with supervision assist  Skilled Therapeutic Interventions/Progress Updates:   Pt received sitting in WC and agreeable to PT. [facility] on dynamic balance and gait training for improve neuromotor return.   Pt performed lateral Reach R and L from level surface to obtain bean bag and then toss to target x 10 each with CGA assist overall and min cues to return to midline following R lateral reach. Pt also perform lateral reach R and L from red wedge to obtain and place clothes pins on small bar x 4 Bil. CGA-min assist for anterior weighty shift and improve thoracic extension in standing to improve posture. Sit<>stand throughout treatment with RW and CGA for invitation of movement; pt noted to have BUE support on RW, regardless of cues for UE placement. .   Dynamic gait training to ambulate with RW x 1253fwith CGA through hall with moderate cues to keep feet closer to RW with external cue to maintain Bil hands near hips. Significant improvement in posture and AD management with this instruction. Pt also performed sit stepping R and L at rail with BUE support and min assist to prevent trunkal compensations and maintain thoracic extension. Forward/revers gait with RW 2 x 10 ft each with min assist for AD management with retro gait and moderate cues for increased step length and to correct posterior  LOB with anterior weight shift. Weave through 3 cones x 2 with RW and CGA with moderate cues for improved AD management.   [facility] and all needs met[facility]         Therapy Documentation Precautions:  Precautions Precautions: Fall Restrictions Weight Bearing Restrictions: No Pain:   denies    Therapy/Group: Individual Therapy  AuLorie Phenix2/05/2019, 12:11 PM

## 2019-07-23 NOTE — Progress Notes (Signed)
Occupational Therapy Session Note  Patient Details  Name: Nicholas Hancock MRN: [id number] Date of Birth: 05-31-1935  Today's Date: 07/23/2019 OT Individual Time: 7829-5621 OT Individual Time Calculation (min): 69 min    Short Term Goals: Week 1:  OT Short Term Goal 1 (Week 1): Pt will complete toileting tasks with Mod A OT Short Term Goal 2 (Week 1): Pt will complete 1 grooming task at the sink while standing with no more than Mod A for balance OT Short Term Goal 3 (Week 1): Pt will don overhead shirt with Min A  Skilled Therapeutic Interventions/Progress Updates:    Upon entering the room, pt seated in wheelchair and having just finished breakfast with no c/o pain this morning. Pt needing max multimodal cuing for orientation this session. Pt frequently laughing when saying the wrong answer. Pt answering correctly 50% of the time when given choice of 2. Pt given paper and writing name with R hand but unable to write his birthday when asked. Pt donning B shoes and tying shoe laces with increased time (20 minutes to don shoes). Pt engaged in repetitive sit <>stand from wheelchair with mod cuing for hand placement and technique. Pt initially needing mod A and decreasing assist to min guard. Pt ambulating in room to obtain needed items with min A and continues to have posterior lean. Pt standing at sink for grooming tasks with both posterior lean and significant anterior lean on toes with mod A for safety needed. Pt returning to wheelchair at end of session with chair alarm belt donned and activated with call bell within reach.   Therapy Documentation Precautions:  Precautions Precautions: Fall Restrictions Weight Bearing Restrictions: No ADL: ADL Grooming: Maximal assistance Where Assessed-Grooming: Edge of bed Upper Body Bathing: Supervision/safety Where Assessed-Upper Body Bathing: Edge of bed Lower Body Bathing: Maximal assistance Where Assessed-Lower Body Bathing: Edge of bed Upper  Body Dressing: Moderate assistance Where Assessed-Upper Body Dressing: Other (Comment)(sitting on BSC) Lower Body Dressing: Dependent Where Assessed-Lower Body Dressing: Other (Comment)(sitting on BSC) Toileting: Maximal assistance Where Assessed-Toileting: Bedside Commode Toilet Transfer: Moderate assistance Toilet Transfer Method: Stand pivot(RW) Toilet Transfer Equipment: Engineer, technical sales Transfer: Not assessed   Therapy/Group: Individual Therapy  Gypsy Decant 07/23/2019, 9:57 AM

## 2019-07-24 ENCOUNTER — Inpatient Hospital Stay (HOSPITAL_COMMUNITY): Payer: Medicare Other | Admitting: Speech Pathology

## 2019-07-24 ENCOUNTER — Inpatient Hospital Stay (HOSPITAL_COMMUNITY): Payer: Medicare Other | Admitting: Occupational Therapy

## 2019-07-24 ENCOUNTER — Inpatient Hospital Stay (HOSPITAL_COMMUNITY): Payer: Medicare Other | Admitting: Physical Therapy

## 2019-07-24 NOTE — Progress Notes (Signed)
Speech Language Pathology Weekly Progress and Session Note  Patient Details  Name: Nicholas Hancock MRN: [id number] Date of Birth: Oct 20, 1934  Beginning of progress report period: July 18, 2019 End of progress report period: July 24, 2019  Today's Date: 07/24/2019 SLP Individual Time: 0830-0929 SLP Individual Time Calculation (min): 59 min  Short Term Goals: Week 1: SLP Short Term Goal 1 (Week 1): Pt will utilize call bell in 7 out of 10 opportunities with Mod A cues. SLP Short Term Goal 1 - Progress (Week 1): Met SLP Short Term Goal 2 (Week 1): Pt will sustain attention to basic familiar task for 3 minutes with Mod A cues. SLP Short Term Goal 2 - Progress (Week 1): Met SLP Short Term Goal 3 (Week 1): Given Mod A cues, pt will utilize external memory aids to recall orientation information. SLP Short Term Goal 3 - Progress (Week 1): Progressing toward goal SLP Short Term Goal 4 (Week 1): Pt will identify 1 physical and 1 cognitive deficits related to acute CVA in 5 out of 10 attempts with Mod A cues. SLP Short Term Goal 4 - Progress (Week 1): Met SLP Short Term Goal 5 (Week 1): Pt will complete basic familiar problem solving tasks with Mod A cues. SLP Short Term Goal 5 - Progress (Week 1): Met SLP Short Term Goal 6 (Week 1): Pt will consume current diet with minimal overt s/s of aspiration and Mod A cues for use of compensatory swallow strategies (specifically not talking). SLP Short Term Goal 6 - Progress (Week 1): Met    New Short Term Goals: Week 2: SLP Short Term Goal 1 (Week 2): Pt will sustain attention to basic familiar task for 3 minutes with Min A cues. SLP Short Term Goal 2 (Week 2): Given Mod A cues, pt will utilize external memory aids to recall orientation information. SLP Short Term Goal 3 (Week 2): Pt will complete basic familiar problem solving tasks with Min A cues. SLP Short Term Goal 4 (Week 2): Pt will consume current diet with minimal overt s/s of aspiration  and Supervision A cues for use of compensatory swallow strategies (specifically not talking).  Weekly Progress Updates: Pt has made functional gains and met 5 out of 6 short term goals this reporting period. Pt is currently Mod assist for basic cognitive tasks due to impairments impacting orientation, problem solving, attention, and short term recall. Pt is consuming regular diet with thin liquids and requires Min-Mod A verbal cues for swallow strategies (specifically cease conversation during mastication). Pt has demonstrated improved sustained attention, orientation to place and situation, and basic problem solving during familiar tasks. Pt and family education is ongoing. Pt would continue to benefit from skilled ST while inpatient in order to maximize functional independence and reduce burden of care prior to discharge. Anticipate that pt will need 24/7 supervision at discharge in addition to [place] follow up at next level of care.      Intensity: Minumum of 1-2 x/day, 30 to 90 minutes Frequency: 3 to 5 out of 7 days Duration/Length of Stay: 12/21 Treatment/Interventions: Cognitive remediation/compensation;Cueing hierarchy;Dysphagia/aspiration precaution training;Functional tasks;Patient/family education;Internal/external aids;Environmental controls;Speech/Language facilitation   Daily Session  Skilled Therapeutic Interventions: Pt was seen for skilled ST targeting dysphagia and cognitive goals. Pt consumed ~50% of his regular texture breakfast and thin liquids with Min A verbal cues for sustained attention to intake and use of swallow strategies (specifically not speaking during mastication). One instance of immediate cough noted following sip of thin coffee  throughout intake. Recommend continue current diet with full supervision during meals. SLP further facilitated session with Mod A verbal and visual cues for problem solving and error awareness during a familiar color block pattern board task (9  color pattern). Pt demonstrated good carryover problem solving skills during this task (as he required Max A during previously targeted session). He also demonstrated recall of instructions for that task Mod I. Pt was independently oriented to place, however required Min A verbal cues to use signage in room to orient to situation. Mod A verbal and visual cues required for pt to locate today's date on calendar. Pt left laying in bed with alarm set and all needs within reach. Continue per current plan of care.          Pain Pain Assessment Pain Scale: 0-10 Pain Score: 0-No pain  Therapy/Group: Individual Therapy  Arbutus Leas 07/24/2019, 10:50 AM

## 2019-07-24 NOTE — Progress Notes (Signed)
Occupational Therapy Session Note  Patient Details  Name: Nicholas Hancock MRN: [id number] Date of Birth: 1935/06/12  Today's Date: 07/24/2019 OT Individual Time: 1001-1100 OT Individual Time Calculation (min): 59 min   Short Term Goals: Week 1:  OT Short Term Goal 1 (Week 1): Pt will complete toileting tasks with Mod A OT Short Term Goal 2 (Week 1): Pt will complete 1 grooming task at the sink while standing with no more than Mod A for balance OT Short Term Goal 3 (Week 1): Pt will don overhead shirt with Min A    Skilled Therapeutic Interventions/Progress Updates:    Pt greeted EOB while his bed alarm was going off. He reported he needed to use the restroom. Ambulatory transfer to toilet completed with Min A using RW. Provided manual cues to keep RW close to his body, however pt would stop ambulating completely with this cue. Mod A for clothing mgt while standing at the toilet. Pt had BM overflow in brief and his pants were completely soiled, as were his legs. After OT started perihygiene near the toilet, transitioned to shower with pt needing manual cuing for RW placement during transfer. He bathed sit<stand from TTB, exhibited forward flexed and Rt leaning posture but no LOBs. He was able to wash his Lt foot using figure 4 position with steady assist for balance. Increased assist needed for washing Rt foot due to decreased hip flexibility. Mod A for sit<stand so OT could thoroughly complete perihygiene. Note that he needed vcs for hand placement during most all sit<stands today. Pt required mod vcs for sequencing while bathing. He them ambulated to w/c parked at sink using device. Mod A for dressing Rt side. Vcs for using figure 4 position to dress Lt side. He was able to pull his pants over hips with Min A for standing balance. While standing at the sink during oral care and handwashing, provided verbal, tactile, and manual cues for obtaining more erect posture. At end of session pt remained in  the w/c and was left with all needs within reach, safety belt fastened, and spouse present.   Therapy Documentation Precautions:  Precautions Precautions: Fall Restrictions Weight Bearing Restrictions: No Vital Signs: Therapy Vitals Pulse Rate: (!) 56 BP: (!) 163/70 Patient Position (if appropriate): Lying Pain: No c/o pain during session  Pain Assessment Pain Scale: 0-10 Pain Score: 0-No pain ADL: ADL Grooming: Maximal assistance Where Assessed-Grooming: Edge of bed Upper Body Bathing: Supervision/safety Where Assessed-Upper Body Bathing: Edge of bed Lower Body Bathing: Maximal assistance Where Assessed-Lower Body Bathing: Edge of bed Upper Body Dressing: Moderate assistance Where Assessed-Upper Body Dressing: Other (Comment)(sitting on BSC) Lower Body Dressing: Dependent Where Assessed-Lower Body Dressing: Other (Comment)(sitting on BSC) Toileting: Maximal assistance Where Assessed-Toileting: Bedside Commode Toilet Transfer: Moderate assistance Toilet Transfer Method: Stand pivot(RW) Toilet Transfer Equipment: Engineer, technical sales Transfer: Not assessed     Therapy/Group: Individual Therapy  Nicholas Hancock 07/24/2019, 12:21 PM

## 2019-07-24 NOTE — Progress Notes (Signed)
[place] PHYSICAL MEDICINE & REHABILITATION PROGRESS NOTE   Subjective/Complaints:  Oriented to hospital not city , freq urination at noc , no burning no improvement since antibiotic for enterococcus UTI  ROS- neg CP, SOB, N/V/D  Objective:   No results found. No results for input(s): WBC, HGB, HCT, PLT in the last 72 hours. No results for input(s): NA, K, CL, CO2, GLUCOSE, BUN, CREATININE, CALCIUM in the last 72 hours.  Intake/Output Summary (Last 24 hours) at 07/24/2019 0829 Last data filed at 07/24/2019 0600 Gross per 24 hour  Intake 480 ml  Output 500 ml  Net -20 ml     Physical Exam: Vital Signs Blood pressure (!) 161/66, pulse (!) 59, temperature 98 F (36.7 C), resp. rate 18, SpO2 98 %.   General: No acute distress, sitting up in bed, eating.  Mood and affect are appropriate Heart: Regular rate and rhythm no rubs murmurs or extra sounds Lungs: Clear to auscultation, breathing unlabored, no rales or wheezes Abdomen: Positive bowel sounds, soft nontender to palpation, nondistended Extremities: No clubbing, cyanosis, or edema Skin: No evidence of breakdown, no evidence of rash Neurologic: Cranial nerves II through XII intact, motor strength is 5/5 in bilateral deltoid, bicep, tricep, grip, hip flexor, knee extensors, ankle dorsiflexor and plantar flexor Orientation to person only " I'm  at home" Sensory exam normal sensation to light touch and proprioception in bilateral upper and lower extremities Cerebellar exam normal finger to nose to finger as well as heel to shin in bilateral upper and lower extremities Musculoskeletal: Full range of motion in all 4 extremities. No joint swelling   Assessment/Plan: 1. Functional deficits secondary to bilateral cortical and subcortical infarcts  which require 3+ hours per day of interdisciplinary therapy in a comprehensive inpatient rehab setting.  Physiatrist is providing close team supervision and 24 hour management of  active medical problems listed below.  Physiatrist and rehab team continue to assess barriers to discharge/monitor patient progress toward functional and medical goals  Care Tool:  Bathing    Body parts bathed by patient: Right arm, Left arm, Chest, Abdomen, Front perineal area, Buttocks, Right upper leg, Right lower leg, Left lower leg, Face, Left upper leg   Body parts bathed by helper: Buttocks, Right lower leg, Left lower leg     Bathing assist Assist Level: Moderate Assistance - Patient 50 - 74%     Upper Body Dressing/Undressing Upper body dressing   What is the patient wearing?: Pull over shirt    Upper body assist Assist Level: Contact Guard/Touching assist    Lower Body Dressing/Undressing Lower body dressing      What is the patient wearing?: Pants     Lower body assist Assist for lower body dressing: Moderate Assistance - Patient 50 - 74%     Toileting Toileting Toileting Activity did not occur Landscape architect and hygiene only): Refused  Toileting assist Assist for toileting: Minimal Assistance - Patient > 75%     Transfers Chair/bed transfer  Transfers assist     Chair/bed transfer assist level: Minimal Assistance - Patient > 75%     Locomotion Ambulation   Ambulation assist      Assist level: Contact Guard/Touching assist Assistive device: Walker-rolling Max distance: 120   Walk 10 feet activity   Assist     Assist level: Contact Guard/Touching assist Assistive device: Walker-rolling   Walk 50 feet activity   Assist Walk 50 feet with 2 turns activity did not occur: Safety/medical concerns  Assist level:  Contact Guard/Touching assist      Walk 150 feet activity   Assist Walk 150 feet activity did not occur: Safety/medical concerns         Walk 10 feet on uneven surface  activity   Assist Walk 10 feet on uneven surfaces activity did not occur: Safety/medical concerns         Wheelchair     Assist Will  patient use wheelchair at discharge?: No Type of Wheelchair: Manual    Wheelchair assist level: Minimal Assistance - Patient > 75% Max wheelchair distance: 100    Wheelchair 50 feet with 2 turns activity    Assist        Assist Level: Minimal Assistance - Patient > 75%   Wheelchair 150 feet activity     Assist  Wheelchair 150 feet activity did not occur: Safety/medical concerns       Blood pressure (!) 161/66, pulse (!) 59, temperature 98 F (36.7 C), resp. rate 18, SpO2 98 %.     Medical Problem List and Plan: 1.  Mild aphasia with decreased functional mobility secondary to small acute left basal ganglia corona radiata and multiple small subacute bilateral occipital and right frontal lobe infarcts ( ?embolic)  as well as history of TIA/CVA 2019 followed by [facility]             -patient may shower             -ELOS/Goals: sup PT, OT, SLP due to cognition   CIR PT, OT , SLP mainly cognitive and balance issues  2.  Antithrombotics: -DVT/anticoagulation: SCDs             -antiplatelet therapy: Aspirin 81 mg daily 3. Pain Management: Tylenol as needed 4. Mood: Provide emotional support             -antipsychotic agents: N/A 5. Neuropsych: This patient is capable of making decisions on his own behalf. 6. Skin/Wound Care: Routine skin checks 7. Fluids/Electrolytes/Nutrition: Routine in and outs with follow-up chemistries  K+ 3.2 on 12/7--will supplement with BID for two doses and repeat BMP up to 3.5, increased aldactone should help preserve K+, BUN running higher than baseline  8.  Permissive hypertension.  Toprol-XL 50 mg daily,Cozaar 25 mg daily, Aldactone 50 mg daily.  Monitor with increased mobility Vitals:   07/24/19 0320 07/24/19 0529  BP: (!) 163/57 (!) 161/66  Pulse: (!) 59   Resp: 18   Temp: 98 F (36.7 C)   SpO2: 98%    Elevated systolic will increase aldactone to home dose started 50mg  on 12/9, sysolics running 10-20 mmHg lower today   Bradycardia as expected from toprol  9.  Acute systolic heart failure.compensated   Follow-up per cardiology services.  Continue Aldactone as well as beta-blocker. Echo done on acute side shows EF 35=40% which was decreased from Echo 1 month prior with EF 55-60%. Lung exam normal today  10.  Hyperlipidemia.  Lipitor 11.  Remote tobacco abuse.  Counseling 12.  Enterococcus UTI S to amp 7d course of amox ordered, through 12/13 freq voiding may be related to increased diuretic dose +/- BPH Check post void  LOS: 7 days A FACE TO FACE EVALUATION WAS PERFORMED  1/14 07/24/2019, 8:29 AM

## 2019-07-24 NOTE — Progress Notes (Signed)
Physical Therapy Session Note  Patient Details  Name: Nicholas Hancock MRN: [id number] Date of Birth: [birth year]  Today's Date: 07/24/2019 PT Individual Time: 1300-1415 PT Individual Time Calculation (min): 75 min   Short Term Goals: Week 1:  PT Short Term Goal 1 (Week 1): Pt will trasnfer to [facility] with min assist consistently PT Short Term Goal 2 (Week 1): Pt will ambulate 137f with min assist and LRAD PT Short Term Goal 3 (Week 1): Pt will be able to maintian dynamic balance with min assist up to 3 min. PT Short Term Goal 4 (Week 1): pt will perform bed mobility with supervision assist  Skilled Therapeutic Interventions/Progress Updates:   Pt received sitting in WC and agreeable to PT. [facility] with RW x 1537fand CGA. PT also instructed pt in gait training with HHA and no AD 2 x 509fach with min assist. Pt noted to have improved posture with no AD, but increased lateral sway and decreased step length. Only marginal changes in gait pattern with any verbal instruction from PT.  Dynamic training to step over 3 canes on floor with RW, HHA and no AD. Each completed x 4 with min assist overall with max cues for increased set up and step length to step over obstacle, but pt aprears to be stepping directly on obstacle intentionally throughout, regardless of instruction from PT.     Dynamic balance training with emphasis on trunk elongation and rotation. Superior/lateral reach followed by cross body reach to tap ball on target x 10 Bil. Shoulder flexion from standing and sitting with unweighted therapy ball x10.  Pt also performed posterior/Lateral reach to obtain and toss horseshoe from EOB and toss to target x 5 Bil. moderate cues for improved upright posture and trunk rotation throughout as well as to improve amplitude movement. Following horse shoe toss, pt picked up bean bags form floor with RW and min assist form PT for safety. Cues for safety and AD  placement to prevent anterior LOB  Nustep reciprocal movement training x 8 min  with mod assist to force full ROM on the RLE and allow increased posterior trunk rotation on the R.   Patient returned to room and [facility] and all needs met.     Therapy Documentation Precautions:  Precautions Precautions: Fall Restrictions Weight Bearing Restrictions: No Vital Signs: Therapy Vitals Temp: 98 F (36.7 C) Pulse Rate: 65 Resp: 16 BP: (!) 188/57 Patient Position (if appropriate): Sitting Oxygen Therapy SpO2: 99 % O2 Device: Room Air Pain: denies   Therapy/Group: Individual Therapy  AusLorie Phenix/06/2019, 5:50 PM

## 2019-07-25 ENCOUNTER — Inpatient Hospital Stay (HOSPITAL_COMMUNITY): Payer: Medicare Other | Admitting: Physical Therapy

## 2019-07-25 DIAGNOSIS — R350 Frequency of micturition: Secondary | ICD-10-CM

## 2019-07-25 DIAGNOSIS — I5021 Acute systolic (congestive) heart failure: Secondary | ICD-10-CM

## 2019-07-25 NOTE — Progress Notes (Signed)
Physical Therapy Weekly Progress Note  Patient Details  Name: Nicholas Hancock MRN: [id number] Date of Birth: [birth year]  Beginning of progress report period: July 18, 2019 End of progress report period: July 25, 2019  Today's Date: 07/25/2019 PT Individual Time: 1620-1720 PT Individual Time Calculation (min): 60 min   Patient has met 3 of 4 short term goals.  Pt is making steady progress towards LTG. Mild inconsistent functional mobility due to increased confusion and decreased orientation to situation in the AM, but pt has progressed to CGA overall for transfers, gait, using RW and CGA-min for bed mobility depending on awareness at time of treatment.   Patient continues to demonstrate the following deficits muscle weakness and muscle joint tightness, decreased cardiorespiratoy endurance, impaired timing and sequencing, unbalanced muscle activation, motor apraxia, decreased coordination and decreased motor planning, decreased midline orientation and decreased attention to right, decreased initiation, decreased attention, decreased awareness, decreased problem solving, decreased safety awareness, decreased memory and delayed processing and decreased sitting balance, decreased standing balance, decreased postural control, hemiplegia and decreased balance strategies and therefore will continue to benefit from skilled PT intervention to increase functional independence with mobility.  Patient progressing toward long term goals..  Continue plan of care.  PT Short Term Goals Week 1:  PT Short Term Goal 1 (Week 1): Pt will trasnfer to [facility] with min assist consistently PT Short Term Goal 1 - Progress (Week 1): Met PT Short Term Goal 2 (Week 1): Pt will ambulate 120f with min assist and LRAD PT Short Term Goal 2 - Progress (Week 1): Met PT Short Term Goal 3 (Week 1): Pt will be able to maintian dynamic balance with min assist up to 3 min. PT Short Term Goal 3 - Progress (Week 1): Met PT Short  Term Goal 4 (Week 1): pt will perform bed mobility with supervision assist PT Short Term Goal 4 - Progress (Week 1): Progressing toward goal Week 2:  PT Short Term Goal 1 (Week 2): STG=LTG due to ELOS  Skilled Therapeutic Interventions/Progress Updates:   Pt received supine in bed and agreeable to PT. Supine>sit transfer with supevision assist and min cues for sequencing and BLE management. Sit<>stand form EOB with supevision assist and elevated bed, cues form PT to push form EOB with no carryover at this time.   Gait training with RW 2x 1587fto rehab gym with supervision assist and min-moderate verbal instruction for posture and to keep RW closer to COM. Dynamic gait training over level surface with HHA on the R x [id number] ambulated through 6 cones with RW and supervision assist as well as with HHA (Min A) overall. Improved safety in turns as well as ability to performed L weight shifting and turning compared to HHA, but Mild improvement in posture without AD vs with RW. Pt also performed forward/retrogait and side stepping in parallel bars 3 x 10 feet each direction with cues for posture and step length throughout.   Dynamic balance training to perform ball toss off trampoline. Completed from level surface 2 x15 with CGA for balance. Also performed from red wedge and blue foam airex pad. Mod assist to prevent posterior LOB on wedge and very poor awareness of LOB, but min assist from airex pad with mild R lateral lean.   Pt returned to room and performed ambulatory transfer to bed with supervision assist and RW. Sit>supine completed with supervision assist, and left supine in bed with call bell in reach and all needs met.  Therapy Documentation Precautions:  Precautions Precautions: Fall Restrictions Weight Bearing Restrictions: No    Vital Signs: Therapy Vitals Temp: 98.5 F (36.9 C) Temp Source: Oral Pulse Rate: (!) 52 Resp: 16 BP: (!) 144/50 Patient Position (if  appropriate): Lying Oxygen Therapy SpO2: 96 % O2 Device: Room Air Pain: denies  Therapy/Group: Individual Therapy  Lorie Phenix 07/25/2019, 5:21 PM

## 2019-07-25 NOTE — Progress Notes (Signed)
[place] PHYSICAL MEDICINE & REHABILITATION PROGRESS NOTE   Subjective/Complaints:  Up in bed. No new complaints. Denies pain  ROS: Limited due to cognitive/behavioral    Objective:   No results found. No results for input(s): WBC, HGB, HCT, PLT in the last 72 hours. No results for input(s): NA, K, CL, CO2, GLUCOSE, BUN, CREATININE, CALCIUM in the last 72 hours.  Intake/Output Summary (Last 24 hours) at 07/25/2019 1232 Last data filed at 07/25/2019 0740 Gross per 24 hour  Intake 438 ml  Output --  Net 438 ml     Physical Exam: Vital Signs Blood pressure (!) 168/50, pulse (!) 58, temperature 98.2 F (36.8 C), resp. rate 18, SpO2 97 %.   Constitutional: No distress . Vital signs reviewed. HEENT: EOMI, oral membranes moist Neck: supple Cardiovascular: RRR without murmur. No JVD    Respiratory: CTA Bilaterally without wheezes or rales. Normal effort    GI: BS +, non-tender, non-distended  Extremities: No clubbing, cyanosis, or edema Skin: No evidence of breakdown, no evidence of rash Neurologic: Cranial nerves II through XII intact, motor strength is 5/5 in bilateral deltoid, bicep, tricep, grip, hip flexor, knee extensors, ankle dorsiflexor and plantar flexor Oriented to person Sensory exam normal sensation to light touch and proprioception in bilateral upper and lower extremities Cerebellar exam normal finger to nose to finger as well as heel to shin in bilateral upper and lower extremities Musculoskeletal: Full range of motion in all 4 extremities. No joint swelling   Assessment/Plan: 1. Functional deficits secondary to bilateral cortical and subcortical infarcts  which require 3+ hours per day of interdisciplinary therapy in a comprehensive inpatient rehab setting.  Physiatrist is providing close team supervision and 24 hour management of active medical problems listed below.  Physiatrist and rehab team continue to assess barriers to discharge/monitor patient  progress toward functional and medical goals  Care Tool:  Bathing    Body parts bathed by patient: Right arm, Left arm, Chest, Abdomen, Front perineal area, Right upper leg, Left lower leg, Face, Left upper leg   Body parts bathed by helper: Right lower leg, Buttocks     Bathing assist Assist Level: Minimal Assistance - Patient > 75%     Upper Body Dressing/Undressing Upper body dressing   What is the patient wearing?: Pull over shirt    Upper body assist Assist Level: Supervision/Verbal cueing    Lower Body Dressing/Undressing Lower body dressing      What is the patient wearing?: Pants, Incontinence brief     Lower body assist Assist for lower body dressing: Maximal Assistance - Patient 25 - 49%     Toileting Toileting Toileting Activity did not occur Press photographer and hygiene only): Refused  Toileting assist Assist for toileting: Maximal Assistance - Patient 25 - 49%     Transfers Chair/bed transfer  Transfers assist     Chair/bed transfer assist level: Minimal Assistance - Patient > 75%     Locomotion Ambulation   Ambulation assist      Assist level: Contact Guard/Touching assist Assistive device: Walker-rolling Max distance: 120   Walk 10 feet activity   Assist     Assist level: Contact Guard/Touching assist Assistive device: Walker-rolling   Walk 50 feet activity   Assist Walk 50 feet with 2 turns activity did not occur: Safety/medical concerns  Assist level: Contact Guard/Touching assist      Walk 150 feet activity   Assist Walk 150 feet activity did not occur: Safety/medical concerns  Walk 10 feet on uneven surface  activity   Assist Walk 10 feet on uneven surfaces activity did not occur: Safety/medical concerns         Wheelchair     Assist Will patient use wheelchair at discharge?: No Type of Wheelchair: Manual    Wheelchair assist level: Minimal Assistance - Patient > 75% Max wheelchair  distance: 100    Wheelchair 50 feet with 2 turns activity    Assist        Assist Level: Minimal Assistance - Patient > 75%   Wheelchair 150 feet activity     Assist  Wheelchair 150 feet activity did not occur: Safety/medical concerns       Blood pressure (!) 168/50, pulse (!) 58, temperature 98.2 F (36.8 C), resp. rate 18, SpO2 97 %.     Medical Problem List and Plan: 1.  Mild aphasia with decreased functional mobility secondary to small acute left basal ganglia corona radiata and multiple small subacute bilateral occipital and right frontal lobe infarcts ( ?embolic)  as well as history of TIA/CVA 2019 [facility]             -patient may shower             -ELOS/Goals: sup PT, OT, SLP due to cognition   Continue CIR PT, OT , SLP mainly cognitive and balance issues  2.  Antithrombotics: -DVT/anticoagulation: SCDs             -antiplatelet therapy: Aspirin 81 mg daily 3. Pain Management: Tylenol as needed 4. Mood: Provide emotional support             -antipsychotic agents: N/A 5. Neuropsych: This patient is capable of making decisions on his own behalf. 6. Skin/Wound Care: Routine skin checks 7. Fluids/Electrolytes/Nutrition: encourage PO  K+ 3.2 on 12/7--will supplement with 13meq BID for two doses and repeat BMP up to 3.5, increased aldactone should help preserve K+, BUN running higher than baseline   8.  Permissive hypertension.  Toprol-XL 50 mg daily,Cozaar 25 mg daily, Aldactone 50 mg daily.  Monitor with increased mobility Vitals:   07/25/19 0303 07/25/19 0846  BP: (!) 172/46 (!) 168/50  Pulse: (!) 53 (!) 58  Resp: 18   Temp: 98.2 F (36.8 C)   SpO2: 97%    Aldactone recently increased to home dose  bp's improving, expected brady 9.  Acute systolic heart failure.compensated   Follow-up per cardiology services.  Continue Aldactone as well as beta-blocker. Echo done on acute side shows EF 35=40% which was decreased from Echo 1 month prior with  EF 55-60%. Clear exam There were no vitals filed for this visit.  Check weights 10.  Hyperlipidemia.  Lipitor 11.  Remote tobacco abuse.  Counseling 12.  Enterococcus UTI S to amp 7d course of amox ordered, through 12/13 freq voiding may be related to increased diuretic dose +/- BPH   PVR's low  hopefully will improve with rx above OOB, toilet to void, double voids   LOS: 8 days A FACE TO FACE EVALUATION WAS PERFORMED  Meredith Staggers 07/25/2019, 12:32 PM

## 2019-07-26 ENCOUNTER — Inpatient Hospital Stay (HOSPITAL_COMMUNITY): Payer: Medicare Other | Admitting: Speech Pathology

## 2019-07-26 ENCOUNTER — Inpatient Hospital Stay (HOSPITAL_COMMUNITY): Payer: Medicare Other | Admitting: Occupational Therapy

## 2019-07-26 NOTE — Progress Notes (Signed)
Occupational Therapy Weekly Progress Note  Patient Details  Name: Nicholas Hancock MRN: [id number] Date of Birth: 12/10/1934  Beginning of progress report period: 07/18/2019 End of progress report period: 07/26/2019  Today's Date: 07/26/2019 OT Individual Time: 5053-9767 OT Individual Time Calculation (min): 58 min    Patient has met 3 of 3 short term goals.    Pt has made functional progress at time of report. At time of evaluation, pt required Mod A for stand pivot toilet transfers. He now completes stated transfers, as well as shower transfers, with Min A at ambulatory level with device. He also now requires Min A for dynamic standing tasks, progressing from time of eval where he required Max A for these tasks. Pt continues to require cuing to improve postural alignment during seated and standing activity for safety. His wife has been present during OT and involved in [place]. He is on track for meeting LTGs at this time.   Patient continues to demonstrate the following deficits: muscle weakness, decreased cardiorespiratoy endurance, ataxia, decreased coordination and decreased motor planning, decreased attention, decreased awareness, decreased problem solving, decreased safety awareness and decreased memory and decreased sitting balance, decreased standing balance, decreased postural control and decreased balance strategies and therefore will continue to benefit from skilled OT intervention to enhance overall performance with BADL.  Patient progressing toward long term goals..  Continue plan of care.  OT Short Term Goals Week 1:  OT Short Term Goal 1 (Week 1): Pt will complete toileting tasks with Mod A OT Short Term Goal 1 - Progress (Week 1): Met OT Short Term Goal 2 (Week 1): Pt will complete 1 grooming task at the sink while standing with no more than Mod A for balance OT Short Term Goal 2 - Progress (Week 1): Met OT Short Term Goal 3 (Week 1): Pt will don overhead shirt with Min A OT  Short Term Goal 3 - Progress (Week 1): Met Week 2:  OT Short Term Goal 1 (Week 2): STGs=LTGs due to ELOS  Skilled Therapeutic Interventions/Progress Updates:    Pt greeted in bed with no s/s pain. Speech more garbled this AM compared to session on Friday with pt still pleasantly confused throughout tx. He completed toileting, showering, dressing, and oral care/grooming tasks during session. All functional transfers completed at ambulatory level using RW with Min A. During transfers he needed vcs to keep hands close to hips for improved walker safety. Vcs also needed for safely turning and backing up to transfer surfaces with min facilitation for RW placement. Min A for dynamic standing balance for perihygiene completion and dressing tasks. Pt able to elevate and lower pants during toileting and LB dressing. Note that pt had a continent bladder void in toilet. He washed both feet in the shower with steady assist when leaning forward to wash Rt foot. Mod A for sit<stand using grab bar in shower. Min A for sit<stands at sink during dressing tasks. He was able to don both gripper socks with close supervision for sitting balance today. CGA for standing balance with vcs to "stand tall" during oral care and handwashing to improve posture. Pt able to fasten 4/6 buttons of his button up shirt! At end of session pt remained in the w/c and was left with all needs within reach and safety belt fastened.     Therapy Documentation Precautions:  Precautions Precautions: Fall Restrictions Weight Bearing Restrictions: No Pain: Pain Assessment Pain Scale: 0-10 Pain Score: 0-No pain ADL: ADL Grooming: Maximal assistance  Where Assessed-Grooming: Edge of bed Upper Body Bathing: Supervision/safety Where Assessed-Upper Body Bathing: Edge of bed Lower Body Bathing: Maximal assistance Where Assessed-Lower Body Bathing: Edge of bed Upper Body Dressing: Moderate assistance Where Assessed-Upper Body Dressing: Other  (Comment)(sitting on BSC) Lower Body Dressing: Dependent Where Assessed-Lower Body Dressing: Other (Comment)(sitting on BSC) Toileting: Maximal assistance Where Assessed-Toileting: Bedside Commode Toilet Transfer: Moderate assistance Toilet Transfer Method: Stand pivot(RW) Toilet Transfer Equipment: Engineer, technical sales Transfer: Not assessed      Therapy/Group: Individual Therapy  Serita Degroote A Carlia Bomkamp 07/26/2019, 4:21 PM

## 2019-07-26 NOTE — Progress Notes (Signed)
Speech Language Pathology Daily Session Note  Patient Details  Name: Nicholas Hancock MRN: [id number] Date of Birth: [birth year]  Today's Date: 07/26/2019 SLP Individual Time: 0300-9233 SLP Individual Time Calculation (min): 40 min  Short Term Goals: Week 2: SLP Short Term Goal 1 (Week 2): Pt will sustain attention to basic familiar task for 3 minutes with Min A cues. SLP Short Term Goal 2 (Week 2): Given Mod A cues, pt will utilize external memory aids to recall orientation information. SLP Short Term Goal 3 (Week 2): Pt will complete basic familiar problem solving tasks with Min A cues. SLP Short Term Goal 4 (Week 2): Pt will consume current diet with minimal overt s/s of aspiration and Supervision A cues for use of compensatory swallow strategies (specifically not talking).  Skilled Therapeutic Interventions:  Pt was seen for skilled ST targeting cognitive goals.   Pt was resting upon arrival but was awakened easily and agreeable to participating in therapy.  Initially pt was calling out for family members; however, following mod-max assist verbal cues for review of orientation aids posted in his room he became more calm.  During functional conversations with therapist, pt slipped in and out of moments where he exhibited language of confusion but he also had several instances where he was able to [facility] his attention to an appropriate thread of conversation for ~3 minute intervals with min verbal cues for redirection.  Pt was left in bed with bed alarm set and call bell within reach.  Continue per current plan of care.    Pain Pain Assessment Pain Scale: 0-10 Pain Score: 0-No pain  Therapy/Group: Individual Therapy  Kenzli Barritt, Selinda Orion 07/26/2019, 4:31 PM

## 2019-07-26 NOTE — Progress Notes (Signed)
[facility] PHYSICAL MEDICINE & REHABILITATION PROGRESS NOTE   Subjective/Complaints:  Slept well no problems overnight  ROS: Patient denies fever, rash, sore throat, blurred vision, nausea, vomiting, diarrhea, cough, shortness of breath or chest pain, joint or back pain, headache, or mood change.     Objective:   No results found. No results for input(s): WBC, HGB, HCT, PLT in the last 72 hours. No results for input(s): NA, K, CL, CO2, GLUCOSE, BUN, CREATININE, CALCIUM in the last 72 hours.  Intake/Output Summary (Last 24 hours) at 07/26/2019 1103 Last data filed at 07/26/2019 0828 Gross per 24 hour  Intake 460 ml  Output 1150 ml  Net -690 ml     Physical Exam: Vital Signs Blood pressure (!) 159/62, pulse (!) 57, temperature 97.9 F (36.6 C), temperature source Axillary, resp. rate 17, SpO2 100 %.   Constitutional: No distress . Vital signs reviewed. HEENT: EOMI, oral membranes moist Neck: supple Cardiovascular: RRR without murmur. No JVD    Respiratory: CTA Bilaterally without wheezes or rales. Normal effort    GI: BS +, non-tender, non-distended  Extremities: No clubbing, cyanosis, or edema Skin: No evidence of breakdown, no evidence of rash Neurologic: Cranial nerves II through XII intact, motor strength is 5/5 in bilateral deltoid, bicep, tricep, grip, hip flexor, knee extensors, ankle dorsiflexor and plantar flexor Oriented to person Nl sensory exam Musculoskeletal: full ROM   Assessment/Plan: 1. Functional deficits secondary to bilateral cortical and subcortical infarcts  which require 3+ hours per day of interdisciplinary therapy in a comprehensive inpatient rehab setting.  Physiatrist is providing close team supervision and 24 hour management of active medical problems listed below.  Physiatrist and rehab team continue to assess barriers to discharge/monitor patient progress toward functional and medical goals  Care Tool:  Bathing    Body parts bathed  by patient: Right arm, Left arm, Chest, Abdomen, Front perineal area, Right upper leg, Left lower leg, Face, Left upper leg, Buttocks, Right lower leg   Body parts bathed by helper: Right lower leg, Buttocks     Bathing assist Assist Level: Minimal Assistance - Patient > 75%     Upper Body Dressing/Undressing Upper body dressing   What is the patient wearing?: Button up shirt    Upper body assist Assist Level: Moderate Assistance - Patient 50 - 74%    Lower Body Dressing/Undressing Lower body dressing      What is the patient wearing?: Pants, Incontinence brief     Lower body assist Assist for lower body dressing: Moderate Assistance - Patient 50 - 74%     Toileting Toileting Toileting Activity did not occur Landscape architect and hygiene only): Refused  Toileting assist Assist for toileting: Moderate Assistance - Patient 50 - 74%     Transfers Chair/bed transfer  Transfers assist     Chair/bed transfer assist level: Supervision/Verbal cueing     Locomotion Ambulation   Ambulation assist      Assist level: Supervision/Verbal cueing Assistive device: Walker-rolling Max distance: 150   Walk 10 feet activity   Assist     Assist level: Supervision/Verbal cueing Assistive device: Walker-rolling   Walk 50 feet activity   Assist Walk 50 feet with 2 turns activity did not occur: Safety/medical concerns  Assist level: Supervision/Verbal cueing      Walk 150 feet activity   Assist Walk 150 feet activity did not occur: Safety/medical concerns  Assist level: Supervision/Verbal cueing      Walk 10 feet on uneven surface  activity  Assist Walk 10 feet on uneven surfaces activity did not occur: Safety/medical concerns         Wheelchair     Assist Will patient use wheelchair at discharge?: No Type of Wheelchair: Manual    Wheelchair assist level: Minimal Assistance - Patient > 75% Max wheelchair distance: 100    Wheelchair 50  feet with 2 turns activity    Assist        Assist Level: Minimal Assistance - Patient > 75%   Wheelchair 150 feet activity     Assist  Wheelchair 150 feet activity did not occur: Safety/medical concerns       Blood pressure (!) 159/62, pulse (!) 57, temperature 97.9 F (36.6 C), temperature source Axillary, resp. rate 17, SpO2 100 %.     Medical Problem List and Plan: 1.  Mild aphasia with decreased functional mobility secondary to small acute left basal ganglia corona radiata and multiple small subacute bilateral occipital and right frontal lobe infarcts ( ?embolic)  as well as history of TIA/CVA 2019 followed by [facility]             -patient may shower             -ELOS/Goals: sup PT, OT, SLP due to cognition   Continue CIR PT, OT , SLP mainly cognitive and balance issues  2.  Antithrombotics: -DVT/anticoagulation: SCDs             -antiplatelet therapy: Aspirin 81 mg daily 3. Pain Management: Tylenol as needed 4. Mood: Provide emotional support             -antipsychotic agents: N/A 5. Neuropsych: This patient is capable of making decisions on his own behalf. 6. Skin/Wound Care: Routine skin checks 7. Fluids/Electrolytes/Nutrition: encourage PO  K+ 3.2 on 12/7--will supplement with BID for two doses and repeat BMP up to 3.5, increased aldactone should help preserve K+, BUN running higher than baseline   8.  Permissive hypertension.  Toprol-XL 50 mg daily,Cozaar 25 mg daily, Aldactone 50 mg daily.  Monitor with increased mobility Vitals:   07/26/19 0443 07/26/19 0816  BP: (!) 173/53 (!) 159/62  Pulse: (!) 58 (!) 57  Resp: 17   Temp: 97.9 F (36.6 C)   SpO2: 100%    Aldactone recently increased to home dose  12/13 bp's at target 9.  Acute systolic heart failure.compensated   Follow-up per cardiology services.  Continue Aldactone as well as beta-blocker. Echo done on acute side shows EF 35=40% which was decreased from Echo 1 month prior with EF  55-60%. Clear exam There were no vitals filed for this visit.  Check weights 10.  Hyperlipidemia.  Lipitor 11.  Remote tobacco abuse.  Counseling 12.  Enterococcus UTI S to amp 7d course of amox ordered, through 12/13 freq voiding may be related to increased diuretic dose +/- BPH   PVR's low  12/13 still fairly frequent    OOB, toilet to void, double voids   LOS: 9 days A FACE TO FACE EVALUATION WAS PERFORMED  Ranelle Oyster 07/26/2019, 11:03 AM

## 2019-07-27 ENCOUNTER — Inpatient Hospital Stay (HOSPITAL_COMMUNITY): Payer: Medicare Other

## 2019-07-27 ENCOUNTER — Inpatient Hospital Stay (HOSPITAL_COMMUNITY): Payer: Medicare Other | Admitting: Physical Therapy

## 2019-07-27 ENCOUNTER — Inpatient Hospital Stay (HOSPITAL_COMMUNITY): Payer: Medicare Other | Admitting: Occupational Therapy

## 2019-07-27 LAB — BASIC METABOLIC PANEL
Anion gap: 9 (ref 5–15)
BUN: 21 mg/dL (ref 8–23)
CO2: 29 mmol/L (ref 22–32)
Calcium: 8.9 mg/dL (ref 8.9–10.3)
Chloride: 103 mmol/L (ref 98–111)
Creatinine, Ser: 1.34 mg/dL — ABNORMAL HIGH (ref 0.61–1.24)
GFR calc Af Amer: 57 mL/min — ABNORMAL LOW (ref >60)
GFR calc non Af Amer: 49 mL/min — ABNORMAL LOW (ref >60)
Glucose, Bld: 188 mg/dL — ABNORMAL HIGH (ref 70–99)
Potassium: 3.7 mmol/L (ref 3.5–5.1)
Sodium: 141 mmol/L (ref 135–145)

## 2019-07-27 NOTE — Progress Notes (Signed)
Speech Language Pathology Daily Session Note  Patient Details  Name: Nicholas Hancock MRN: [id number] Date of Birth: 14-Apr-1935  Today's Date: 07/27/2019 SLP Individual Time: 0355-9741 SLP Individual Time Calculation (min): 45 min  Short Term Goals: Week 2: SLP Short Term Goal 1 (Week 2): Pt will sustain attention to basic familiar task for 3 minutes with Min A cues. SLP Short Term Goal 2 (Week 2): Given Mod A cues, pt will utilize external memory aids to recall orientation information. SLP Short Term Goal 3 (Week 2): Pt will complete basic familiar problem solving tasks with Min A cues. SLP Short Term Goal 4 (Week 2): Pt will consume current diet with minimal overt s/s of aspiration and Supervision A cues for use of compensatory swallow strategies (specifically not talking).  Skilled Therapeutic Interventions: Skilled ST services focused on cognitive skills. Pt's wife was present for treatment session and provided clarification of several non-intelligible and off topic communication messages by pt. Pt was oriented to city, situation and month, recquiring max A verbal/visual cues for place and day/date. SLP facilitated sustained attention and basic problem solving skills in several card sorting tasks, pt demonstrated ability to sort cards by 2 colors mod I and by 6 colors (wth various shapes and number of shapes on cards) with min A verbal cues for problem solving and recall. Pt demonstrated sustained attention in 3 minute intervals with min A verbals. Pt required max A verbal cues for problem solving and recall when sorting cards by shape (with various colors and number of shapes.) Pt required increase in cuing for sustained attention as session progressed and pt became internally distracted with off topic comments/thoughts. Pt demonstrated basic problem solving in card task WAR, demonstrating ability to name/piont to highest card out of two with cues only for perseverative movements (continued  flipping cards prior to identifying highest card.) Pt was left in room with wife in room, call bell within reach and chair alarm set. ST recommends to continue skilled ST services.      Pain Pain Assessment Pain Score: 0-No pain  Therapy/Group: Individual Therapy  Haidyn Kilburg  [facility] 07/27/2019, 12:48 PM

## 2019-07-27 NOTE — Progress Notes (Signed)
Occupational Therapy Session Note  Patient Details  Name: Nicholas Hancock MRN: [id number] Date of Birth: 12-09-1934  Today's Date: 07/27/2019 OT Individual Time: 1300-1411 OT Individual Time Calculation (min): 71 min   Short Term Goals: Week 2:  OT Short Term Goal 1 (Week 2): STGs=LTGs due to ELOS  Skilled Therapeutic Interventions/Progress Updates:    Pt greeted in w/c with spouse present. Initiated d/c planning with spouse Nicholas Hancock and solidified DME needs. Relayed OT DME needs to SW. Pt then engaged in bathing at shower level sit<stand, dressing sit<stand at sink, and oral care/grooming tasks standing at sink during session. All functional transfers completed at ambulatory level using RW with steady assist and vcs to "keep hands close to hips" for improved safety with device. Steady assist for sit<stands and Min A for dynamic standing activity. Pt continues to require cues for safety and sequencing due to cognitive deficits. Steady assist for balance while washing Rt foot in shower. After OT donned Ted hose, pt propped the Rt foot on his bed so he could don Rt gripper sock with increased ease. Pt is able to utilize figure 4 functionally with Lt LE but not Rt LE. He initiated correctly orienting tshirt after donning it backwards today. Close supervision-steady assist for balance while he brushed his teeth and washed hands at the sink. Improved postural alignment noted when compared to OT sessions last week. At end of session pt wanted to remain in the w/c. Left him with all needs within reach and safety belt fastened.    Therapy Documentation Precautions:  Precautions Precautions: Fall Restrictions Weight Bearing Restrictions: No Vital Signs: Therapy Vitals Temp: 98.4 F (36.9 C) Temp Source: Oral Pulse Rate: (!) 56 Resp: 17 BP: (!) 160/60 Patient Position (if appropriate): Sitting Oxygen Therapy SpO2: 98 % O2 Device: Room Air Pain: Pain Assessment Pain Score: 0-No  pain ADL: ADL Grooming: Maximal assistance Where Assessed-Grooming: Edge of bed Upper Body Bathing: Supervision/safety Where Assessed-Upper Body Bathing: Edge of bed Lower Body Bathing: Maximal assistance Where Assessed-Lower Body Bathing: Edge of bed Upper Body Dressing: Moderate assistance Where Assessed-Upper Body Dressing: Other (Comment)(sitting on BSC) Lower Body Dressing: Dependent Where Assessed-Lower Body Dressing: Other (Comment)(sitting on BSC) Toileting: Maximal assistance Where Assessed-Toileting: Bedside Commode Toilet Transfer: Moderate assistance Toilet Transfer Method: Stand pivot(RW) Toilet Transfer Equipment: Engineer, technical sales Transfer: Not assessed      Therapy/Group: Individual Therapy  Nicholas Hancock A Nicholas Hancock 07/27/2019, 2:15 PM

## 2019-07-27 NOTE — Progress Notes (Signed)
Nicholas H[facility] PHYSICAL MEDICINE & REHABILITATION PROGRESS NOTE   Subjective/Complaints:  No issues overnite , awake and alert this am, starting to work with PT, ate well with sup this am   ROS: Patient denies , blurred vision, nausea, vomiting, diarrhea, cough, shortness of breath or chest pain, joint or back pain, headache, or mood change.     Objective:   No results found. No results for input(s): WBC, HGB, HCT, PLT in the last 72 hours. No results for input(s): NA, K, CL, CO2, GLUCOSE, BUN, CREATININE, CALCIUM in the last 72 hours.  Intake/Output Summary (Last 24 hours) at 07/27/2019 0909 Last data filed at 07/27/2019 0730 Gross per 24 hour  Intake 780 ml  Output 575 ml  Net 205 ml     Physical Exam: Vital Signs Blood pressure (!) 167/56, pulse (!) 55, temperature 97.9 F (36.6 C), temperature source Oral, resp. rate 20, SpO2 99 %.   Constitutional: No distress . Vital signs reviewed. HEENT: EOMI, oral membranes moist Neck: supple Cardiovascular: RRR without murmur. No JVD    Respiratory: CTA Bilaterally without wheezes or rales. Normal effort    GI: BS +, non-tender, non-distended  Extremities: No clubbing, cyanosis, or edema Skin: No evidence of breakdown, no evidence of rash Neurologic: Cranial nerves II through XII intact, motor strength is 5/5 in bilateral deltoid, bicep, tricep, grip, hip flexor, knee extensors, ankle dorsiflexor and plantar flexor Oriented to person Nl sensory exam Musculoskeletal: full ROM   Assessment/Plan: 1. Functional deficits secondary to bilateral cortical and subcortical infarcts  which require 3+ hours per day of interdisciplinary therapy in a comprehensive inpatient rehab setting.  Physiatrist is providing close team supervision and 24 hour management of active medical problems listed below.  Physiatrist and rehab team continue to assess barriers to discharge/monitor patient progress toward functional and medical goals  Care  Tool:  Bathing    Body parts bathed by patient: Right arm, Left arm, Chest, Abdomen, Front perineal area, Right upper leg, Left lower leg, Face, Left upper leg, Buttocks, Right lower leg   Body parts bathed by helper: Right lower leg, Buttocks     Bathing assist Assist Level: Minimal Assistance - Patient > 75%     Upper Body Dressing/Undressing Upper body dressing   What is the patient wearing?: Button up shirt    Upper body assist Assist Level: Moderate Assistance - Patient 50 - 74%    Lower Body Dressing/Undressing Lower body dressing      What is the patient wearing?: Pants, Incontinence brief     Lower body assist Assist for lower body dressing: Moderate Assistance - Patient 50 - 74%     Toileting Toileting Toileting Activity did not occur Landscape architect and hygiene only): Refused  Toileting assist Assist for toileting: Moderate Assistance - Patient 50 - 74%     Transfers Chair/bed transfer  Transfers assist     Chair/bed transfer assist level: Supervision/Verbal cueing     Locomotion Ambulation   Ambulation assist      Assist level: Supervision/Verbal cueing Assistive device: Walker-rolling Max distance: 150   Walk 10 feet activity   Assist     Assist level: Supervision/Verbal cueing Assistive device: Walker-rolling   Walk 50 feet activity   Assist Walk 50 feet with 2 turns activity did not occur: Safety/medical concerns  Assist level: Supervision/Verbal cueing      Walk 150 feet activity   Assist Walk 150 feet activity did not occur: Safety/medical concerns  Assist level: Supervision/Verbal cueing  Walk 10 feet on uneven surface  activity   Assist Walk 10 feet on uneven surfaces activity did not occur: Safety/medical concerns         Wheelchair     Assist Will patient use wheelchair at discharge?: No Type of Wheelchair: Manual    Wheelchair assist level: Minimal Assistance - Patient > 75% Max  wheelchair distance: 100    Wheelchair 50 feet with 2 turns activity    Assist        Assist Level: Minimal Assistance - Patient > 75%   Wheelchair 150 feet activity     Assist  Wheelchair 150 feet activity did not occur: Safety/medical concerns       Blood pressure (!) 167/56, pulse (!) 55, temperature 97.9 F (36.6 C), temperature source Oral, resp. rate 20, SpO2 99 %.     Medical Problem List and Plan: 1.  Mild aphasia with decreased functional mobility secondary to small acute left basal ganglia corona radiata and multiple small subacute bilateral occipital and right frontal lobe infarcts ( ?embolic)  as well as history of TIA/CVA 2019 followed by [facility]             -patient may shower             -ELOS/Goals: sup PT, OT, SLP due to cognition   Continue CIR PT, OT , SLP mainly cognitive and balance issues  2.  Antithrombotics: -DVT/anticoagulation: SCDs             -antiplatelet therapy: Aspirin 81 mg daily 3. Pain Management: Tylenol as needed 4. Mood: Provide emotional support             -antipsychotic agents: N/A 5. Neuropsych: This patient is capable of making decisions on his own behalf. 6. Skin/Wound Care: Routine skin checks 7. Fluids/Electrolytes/Nutrition: encourage PO  K+ 3.2 on 12/7--will supplement with BID for two doses and repeat BMP up to 3.5, increased aldactone should help preserve K+, BUN running higher than baseline   8.  Permissive hypertension.  Toprol-XL 50 mg daily,Cozaar 25 mg daily, Aldactone 50 mg daily.  Monitor with increased mobility Vitals:   07/26/19 2006 07/27/19 0451  BP: (!) 173/61 (!) 167/56  Pulse: (!) 58 (!) 55  Resp: 18 20  Temp: 98.4 F (36.9 C) 97.9 F (36.6 C)  SpO2: 97% 99%   Elevated systolic premedication this am on aldactone, cozaar and metoprolol , will monitor , check BMET may need to make further adjustment  9.  Acute systolic heart failure.compensated   Follow-up per cardiology services.  Continue  Aldactone as well as beta-blocker. Echo done on acute side shows EF 35=40% which was decreased from Echo 1 month prior with EF 55-60%. Clear exam There were no vitals filed for this visit.  Check weights 10.  Hyperlipidemia.  Lipitor 11.  Remote tobacco abuse.  Counseling 12.  Enterococcus UTI S to amp 7d course of amox ordered, through 12/13 freq voiding may be related to increased diuretic dose +/- BPH   PVR's low  12/13 still fairly frequent    OOB, toilet to void, double voids   LOS: 10 days A FACE TO FACE EVALUATION WAS PERFORMED  Erick Colace 07/27/2019, 9:09 AM

## 2019-07-27 NOTE — Progress Notes (Signed)
Physical Therapy Session Note  Patient Details  Name: Nicholas Hancock MRN: [id number] Date of Birth: [birth year]  Today's Date: 07/27/2019 PT Individual Time:  -      Short Term Goals: Week 2:  PT Short Term Goal 1 (Week 2): STG=LTG due to ELOS  Skilled Therapeutic Interventions/Progress Updates:  Pt presented in w/c agreeable to therapy. Pt denies pain throughout session. Pt ambulated to rehab gym with RW and CGA fading to close S with improved safe of RW from last time pt worked with this therapist. Pt participated in gait training with RW weaving through cones and stepping over walking stick. Pt required mod cues for maintaining RW close to self when making turns but demonstrated fair safety stepping over walking stick. Participated in stepping over walking step forwards/backwards x 10 bilaterally with and without RW with emphasis on posture and clearing threshold. Pt noted improvement on second bout vs first. Participated in x 2 bouts of horseshoes while standing on red wedge. Pt required mirror feedback initially due to heavy R lean which pt was not able to correct with verbal cues alone. On second bout pt demonstrated improved posture and no R lateral lean with activity. Pt then ambulated to day room with RW and CGA and participated in NuStep L3 x 6 min for global conditioning. Pt unable to recall length of time PTA asked pt to perform activity after 2 min. Once completed pt ambulated back to room with pt unable to recall room location without max cues. Pt transferred back to w/c at end of session and left with belt alarm on, call bell within reach and needs met.      Therapy Documentation Precautions:  Precautions Precautions: Fall Restrictions Weight Bearing Restrictions: No General:   Vital Signs: Therapy Vitals Temp: 97.9 F (36.6 C) Temp Source: Oral Pulse Rate: (!) 55 Resp: 20 BP: (!) 167/56 Patient Position (if appropriate): Lying Oxygen Therapy SpO2: 99 % O2 Device:  Room Air  Therapy/Group: Individual Therapy  Olanna Percifield  Trena Dunavan, PTA  07/27/2019, 7:56 AM

## 2019-07-28 ENCOUNTER — Inpatient Hospital Stay (HOSPITAL_COMMUNITY): Payer: Medicare Other | Admitting: Speech Pathology

## 2019-07-28 ENCOUNTER — Inpatient Hospital Stay (HOSPITAL_COMMUNITY): Payer: Medicare Other | Admitting: Physical Therapy

## 2019-07-28 ENCOUNTER — Inpatient Hospital Stay (HOSPITAL_COMMUNITY): Payer: Medicare Other | Admitting: Occupational Therapy

## 2019-07-28 NOTE — Progress Notes (Signed)
[place] PHYSICAL MEDICINE & REHABILITATION PROGRESS NOTE   Subjective/Complaints:  Appreciate nursing note, poor safety awareness , no injury   ROS: Patient denies , nausea, vomiting, diarrhea, cough, shortness of breath or chest pain, joint or back pain,    Objective:   No results found. No results for input(s): WBC, HGB, HCT, PLT in the last 72 hours. Recent Labs    07/27/19 1029  NA 141  K 3.7  CL 103  CO2 29  GLUCOSE 188*  BUN 21  CREATININE 1.34*  CALCIUM 8.9    Intake/Output Summary (Last 24 hours) at 07/28/2019 0843 Last data filed at 07/28/2019 0230 Gross per 24 hour  Intake 280 ml  Output 300 ml  Net -20 ml     Physical Exam: Vital Signs Blood pressure (!) 170/56, pulse (!) 59, temperature 98.6 F (37 C), temperature source Oral, resp. rate 16, SpO2 97 %.   Constitutional: No distress . Vital signs reviewed. HEENT: EOMI, oral membranes moist Neck: supple Cardiovascular: RRR without murmur. No JVD    Respiratory: CTA Bilaterally without wheezes or rales. Normal effort    GI: BS +, non-tender, non-distended  Extremities: No clubbing, cyanosis, or edema Skin: No evidence of breakdown, no evidence of rash Neurologic: Cranial nerves II through XII intact, motor strength is 5/5 in bilateral deltoid, bicep, tricep, grip, hip flexor, knee extensors, ankle dorsiflexor and plantar flexor Oriented to person Nl sensory exam Musculoskeletal: full ROM without pain    Assessment/Plan: 1. Functional deficits secondary to bilateral cortical and subcortical infarcts  which require 3+ hours per day of interdisciplinary therapy in a comprehensive inpatient rehab setting.  Physiatrist is providing close team supervision and 24 hour management of active medical problems listed below.  Physiatrist and rehab team continue to assess barriers to discharge/monitor patient progress toward functional and medical goals  Care Tool:  Bathing    Body parts bathed by  patient: Right arm, Left arm, Chest, Abdomen, Front perineal area, Right upper leg, Left lower leg, Face, Left upper leg, Buttocks, Right lower leg   Body parts bathed by helper: Right lower leg, Buttocks     Bathing assist Assist Level: Minimal Assistance - Patient > 75%     Upper Body Dressing/Undressing Upper body dressing   What is the patient wearing?: Pull over shirt    Upper body assist Assist Level: Supervision/Verbal cueing    Lower Body Dressing/Undressing Lower body dressing      What is the patient wearing?: Pants, Incontinence brief     Lower body assist Assist for lower body dressing: Moderate Assistance - Patient 50 - 74%     Toileting Toileting Toileting Activity did not occur Press photographer and hygiene only): Refused  Toileting assist Assist for toileting: Moderate Assistance - Patient 50 - 74%     Transfers Chair/bed transfer  Transfers assist     Chair/bed transfer assist level: Supervision/Verbal cueing     Locomotion Ambulation   Ambulation assist      Assist level: Supervision/Verbal cueing Assistive device: Walker-rolling Max distance: 150   Walk 10 feet activity   Assist     Assist level: Supervision/Verbal cueing Assistive device: Walker-rolling   Walk 50 feet activity   Assist Walk 50 feet with 2 turns activity did not occur: Safety/medical concerns  Assist level: Supervision/Verbal cueing      Walk 150 feet activity   Assist Walk 150 feet activity did not occur: Safety/medical concerns  Assist level: Supervision/Verbal cueing  Walk 10 feet on uneven surface  activity   Assist Walk 10 feet on uneven surfaces activity did not occur: Safety/medical concerns         Wheelchair     Assist Will patient use wheelchair at discharge?: No Type of Wheelchair: Manual    Wheelchair assist level: Minimal Assistance - Patient > 75% Max wheelchair distance: 100    Wheelchair 50 feet with 2 turns  activity    Assist        Assist Level: Minimal Assistance - Patient > 75%   Wheelchair 150 feet activity     Assist  Wheelchair 150 feet activity did not occur: Safety/medical concerns       Blood pressure (!) 170/56, pulse (!) 59, temperature 98.6 F (37 C), temperature source Oral, resp. rate 16, SpO2 97 %.     Medical Problem List and Plan: 1.  Mild aphasia with decreased functional mobility secondary to small acute left basal ganglia corona radiata and multiple small subacute bilateral occipital and right frontal lobe infarcts ( ?embolic)  as well as history of TIA/CVA 2019 followed by Dr.Jaffe             -patient may shower             -ELOS/Goals: sup PT, OT, SLP due to cognition   Continue CIR PT, OT , SLP team conf in am  2.  Antithrombotics: -DVT/anticoagulation: SCDs             -antiplatelet therapy: Aspirin 81 mg daily 3. Pain Management: Tylenol as needed 4. Mood: Provide emotional support             -antipsychotic agents: N/A 5. Neuropsych: This patient is capable of making decisions on his own behalf. 6. Skin/Wound Care: Routine skin checks 7. Fluids/Electrolytes/Nutrition: encourage PO  K+ 3.2 on 12/7--will supplement with 71meq BID for two doses and repeat BMP up to 3.5, increased aldactone should help preserve K+, BUN running higher than baseline   8.  Permissive hypertension.  Toprol-XL 50 mg daily,Cozaar 25 mg daily, Aldactone 50 mg daily.  Monitor with increased mobility Vitals:   07/27/19 2256 07/28/19 0109  BP: (!) 170/64 (!) 170/56  Pulse: 68 (!) 59  Resp: 16 16  Temp: 98.6 F (37 C) 98.6 F (37 C)  SpO2: 95% 97%   Elevated systolic premedication this am on aldactone, cozaar and metoprolol , will monitor , check BMET may need to make further adjustment  Increase cozaar 9.  Acute systolic heart failure.compensated   Follow-up per cardiology services.  Continue Aldactone as well as beta-blocker. Echo done on acute side shows EF 35=40%  which was decreased from Echo 1 month prior with EF 55-60%. Clear exam There were no vitals filed for this visit.  Check weights 10.  Hyperlipidemia.  Lipitor 11.  Remote tobacco abuse.  Counseling 12.  Enterococcus UTI S to amp 7d course of amox ordered, through 12/13 freq voiding may be related to increased diuretic dose +/- BPH   PVR's low  12/13 still fairly frequent    OOB, toilet to void, double voids   LOS: 11 days A FACE TO FACE EVALUATION WAS PERFORMED  Charlett Blake 07/28/2019, 8:43 AM

## 2019-07-28 NOTE — Progress Notes (Signed)
Speech Language Pathology Daily Session Note  Patient Details  Name: JAHAAD PENADO MRN: [id number] Date of Birth: [birth year]  Today's Date: 07/28/2019 SLP Individual Time: 1130-1201 SLP Individual Time Calculation (min): 31 min  Short Term Goals: Week 2: SLP Short Term Goal 1 (Week 2): Pt will sustain attention to basic familiar task for 3 minutes with Min A cues. SLP Short Term Goal 2 (Week 2): Given Mod A cues, pt will utilize external memory aids to recall orientation information. SLP Short Term Goal 3 (Week 2): Pt will complete basic familiar problem solving tasks with Min A cues. SLP Short Term Goal 4 (Week 2): Pt will consume current diet with minimal overt s/s of aspiration and Supervision A cues for use of compensatory swallow strategies (specifically not talking).  Skilled Therapeutic Interventions: Pt was seen for skilled ST targeting dysphagia and speech goals. SLP facilitated session with a regular texture solid snack and thin H2O. No overt s/sx were observed and he demonstrated improved ability to utilize swallow strategies with Superversion A verbal cues today. SLP further facilitated session with conversation level tasks targeting intelligibility. Although he had some difficulty maintaining topics of conversation (requiring verbal cues for sustained attention), pt intentionally used increased vocal intensity and slow rate to achieve 90% intelligibility with only Supervision A verbal cues. Pt's wife was also at bedside and reported pt's family members have noticed improved ability to understand speech and carry on conversations over the phone, however speech becomes less intelligible when pt is fatigued - SLP validated to pt and wife this is to be expected. Pt was left sitting in wheelchair with seatbelt alarm in place and needs within reach, wife still present. Continue per current plan of care.       Pain Pain Assessment Pain Scale: 0-10 Pain Score: 0-No pain  Therapy/Group:  Individual Therapy  Arbutus Leas 07/28/2019, 12:03 PM

## 2019-07-28 NOTE — Progress Notes (Signed)
Occupational Therapy Session Note  Patient Details  Name: Nicholas Hancock MRN: [id number] Date of Birth: 06/11/1935  Today's Date: 07/28/2019 OT Individual Time: 6389-3734 OT Individual Time Calculation (min): 58 min    Short Term Goals: Week 2:  OT Short Term Goal 1 (Week 2): STGs=LTGs due to ELOS  Skilled Therapeutic Interventions/Progress Updates:    Pt completed bathing and dressing sit to stand at the sink during session.  He was able to state the city we were in as well as stating that he had a stroke but could not state the month, day of the week, or that this was [facility].  He perseverated on talking about people shooting or hunting at various times throughout the session, but was easily re-directed to task.  He completed UB bathing with max instructional cueing to sequence secondary to decreased sustained attention.  He needed min assist for sit to stand and for standing balance when washing peri area.  He was able to complete UB dressing with setup but needed mod assist for donning LB clothing including TEDs and gripper socks.  Finished session with pt in the wheelchair and with call button and phone in reach.  Safety belt in place with tele-sitter and spouse present as well.   Therapy Documentation Precautions:  Precautions Precautions: Fall Restrictions Weight Bearing Restrictions: No Pain: Pain Assessment Pain Scale: Faces Pain Score: 0-No pain ADL: See Care Tool Section for some details of mobility and selfcare  Therapy/Group: Individual Therapy  Ridgely Anastacio OTR/L 07/28/2019, 12:14 PM

## 2019-07-28 NOTE — Progress Notes (Signed)
Physical Therapy Session Note  Patient Details  Name: INIGO LANTIGUA MRN: [id number] Date of Birth: 1935-03-20  Today's Date: 07/28/2019 PT Individual Time:  -      Short Term Goals: Week 2:  PT Short Term Goal 1 (Week 2): STG=LTG due to ELOS  Skilled Therapeutic Interventions/Progress Updates: Pt presented in w/c agreeable to therapy. Pt denies pain during session. Pt transported to ortho gym for energy conservation and participated in [place] mode A for scanning and attention to task. Pt was able to perform 2 minute task on x 2 trials both with and without AD with pt noted to be appropriately scanning for red light approx 80% of time. Pt did require x 1 re-direction when scanning as pt began to push random numbers and when asked by PTA what task was pt was unable to answer. Pt then transported into hallway and participated in obstacle course including walking on uneven surface, stepping over threshold and weaving through cones. Pt was able to perform task at overall CGA however required max multimodal cues for keeping RW close to person, negotiation of RW when weaving through cones, and safe foot placement when stepping over threshold. Pt required increased time for sequencing and initiation as well as poor B foot clearance with near shuffling gait. Once completed pt ambulated back to room with CGA and verbal cues for keeping RW close to body, increasing B foot clearnce, and B step length. Once pt back in room pt remained in w/c with call bell within reach, belt alarm on, and needs met.      Therapy Documentation Precautions:  Precautions Precautions: Fall Restrictions Weight Bearing Restrictions: No    Therapy/Group: Individual Therapy  Odean Fester  Geniene List, PTA  07/28/2019, 8:02 AM

## 2019-07-28 NOTE — Progress Notes (Addendum)
Pt was found on sitting on floor with back against the bed. Wheelchair pushed to the side with safety belt still buckled. Pt was found first by the nurse tech. This writer went in to assess pt and pt stated that he was in no pain and he didn't know why he was on the floor. Pt A&Ox1-2 at baseline  Disoriented to situation, time and place Pt put in bed and asked by this writer was he hurt. Pt again denied pain anywhere. No new bruising occurred during fall, rash/redness  on back was prior to fall Will start vital signs per protocol. Provider notified and wife was called.

## 2019-07-28 NOTE — Progress Notes (Signed)
Speech Language Pathology Daily Session Note  Patient Details  Name: Nicholas Hancock MRN: [id number] Date of Birth: [birth year]  Today's Date: 07/28/2019 SLP Individual Time: 1400-1458 SLP Individual Time Calculation (min): 58 min  Short Term Goals: Week 2: SLP Short Term Goal 1 (Week 2): Pt will sustain attention to basic familiar task for 3 minutes with Min A cues. SLP Short Term Goal 2 (Week 2): Given Mod A cues, pt will utilize external memory aids to recall orientation information. SLP Short Term Goal 3 (Week 2): Pt will complete basic familiar problem solving tasks with Min A cues. SLP Short Term Goal 4 (Week 2): Pt will consume current diet with minimal overt s/s of aspiration and Supervision A cues for use of compensatory swallow strategies (specifically not talking).  Skilled Therapeutic Interventions: Pt was seen for skilled ST targeting cognitive goals. SLP facilitated session with overall Max A verbal and visual cues for basic problem solving throughout 2 tasks; a basic 3-step action card sequencing activity, as well as a number/card sequencing task. Although this level of cueing is increased on comparison to previous sessions, pt demonstrated improved ability to sustain attention to tasks throughout session; only Min A verbal cues were required for redirection, and pt sustained attention for ~5 minute intervals. Pt used external aids in room Mod I in order to orient to place and situation. He also independently recalled the season, however Mod-Max A verbal and visual cues still required for pt to identify today's month and date. Pt left sitting in wheelchair with seatbelt alarm in place and needs within reach. Max A verbal cues also required for recall of earlier PT session. Continue per current plan of care.       Pain Pain Assessment Pain Scale: Faces Pain Score: 0-No pain Faces Pain Scale: No hurt  Therapy/Group: Individual Therapy  Arbutus Leas 07/28/2019, 3:00 PM

## 2019-07-29 ENCOUNTER — Inpatient Hospital Stay (HOSPITAL_COMMUNITY): Payer: Medicare Other

## 2019-07-29 ENCOUNTER — Inpatient Hospital Stay (HOSPITAL_COMMUNITY): Payer: Medicare Other | Admitting: Physical Therapy

## 2019-07-29 ENCOUNTER — Inpatient Hospital Stay (HOSPITAL_COMMUNITY): Payer: Medicare Other | Admitting: Speech Pathology

## 2019-07-29 MED ORDER — LORAZEPAM 0.5 MG PO TABS: 0.2500 mg | ORAL_TABLET | Freq: Four times a day (QID) | ORAL | Status: DC | PRN

## 2019-07-29 MED ORDER — LORAZEPAM 2 MG/ML IJ SOLN: 0.5000 mg | Freq: Four times a day (QID) | INTRAMUSCULAR | Status: DC | PRN

## 2019-07-29 NOTE — Plan of Care (Signed)
  Problem: RH Swallowing Goal: LTG Patient will consume least restrictive diet using compensatory strategies with assistance (SLP) Description: LTG:  Patient will consume least restrictive diet using compensatory strategies with assistance (SLP) Flowsheets (Taken 07/29/2019 1256) LTG: Pt Patient will consume least restrictive diet using compensatory strategies with assistance of (SLP): Supervision   Problem: RH Cognition - SLP Goal: RH LTG Patient will demonstrate orientation with cues Description:  LTG:  Patient will demonstrate orientation to person/place/time/situation with cues (SLP)   Flowsheets (Taken 07/29/2019 1256) LTG Patient will demonstrate orientation to:  Place  Situation LTG: Patient will demonstrate orientation using cueing (SLP): Supervision   Problem: RH Problem Solving Goal: LTG Patient will demonstrate problem solving for (SLP) Description: LTG:  Patient will demonstrate problem solving for basic/complex daily situations with cues  (SLP) Flowsheets (Taken 07/29/2019 1256) LTG: Patient will demonstrate problem solving for (SLP): Basic daily situations LTG Patient will demonstrate problem solving for: Moderate Assistance - Patient 50 - 74%   Problem: RH Memory Goal: LTG Patient will use memory compensatory aids to (SLP) Description: LTG:  Patient will use memory compensatory aids to recall biographical/new, daily complex information with cues (SLP) Flowsheets (Taken 07/29/2019 1256) LTG: Patient will use memory compensatory aids to (SLP): Moderate Assistance - Patient 50 - 74%   Problem: RH Awareness Goal: LTG: Patient will demonstrate awareness during functional activites type of (SLP) Description: LTG: Patient will demonstrate awareness during functional activites type of (SLP) Flowsheets (Taken 07/29/2019 1256) Patient will demonstrate during cognitive/linguistic activities awareness type of: Emergent LTG: Patient will demonstrate awareness during  cognitive/linguistic activities with assistance of (SLP): Moderate Assistance - Patient 50 - 74%  Downgraded due to lack of progress

## 2019-07-29 NOTE — Progress Notes (Signed)
Speech Language Pathology Daily Session Note  Patient Details  Name: Nicholas Hancock MRN: [id number] Date of Birth: 03-06-1935  Today's Date: 07/29/2019 SLP Individual Time: 1400-1458 SLP Individual Time Calculation (min): 58 min  Short Term Goals: Week 2: SLP Short Term Goal 1 (Week 2): Pt will sustain attention to basic familiar task for 3 minutes with Min A cues. SLP Short Term Goal 2 (Week 2): Given Mod A cues, pt will utilize external memory aids to recall orientation information. SLP Short Term Goal 3 (Week 2): Pt will complete basic familiar problem solving tasks with Min A cues. SLP Short Term Goal 4 (Week 2): Pt will consume current diet with minimal overt s/s of aspiration and Supervision A cues for use of compensatory swallow strategies (specifically not talking).  Skilled Therapeutic Interventions: Pt was seen for skilled ST targeting dysphagia and cognitive goals. SLP facilitated session with skilled observation of pt consuming regular texture snack with thin liquid. One instance of wet vocal quality noted during intake of regular solid and 1 supervision level verbal cue required to cease verbosity during intake. Recommend continue current diet. Pt demonstrated improved ability to sort money (bills and coins X4) with Min A verbal and visual cues for problem solving and error awareness. However, increased Mod-Max A verbal and visual cues required for pt to display set amounts of change and complete basic addition problems. During a familiar color block pattern matching task (pattern consisting of 9 colors), pt required Mod A verbal and visual cues for problem solving and error awareness - relatively consistent with his performance on this task in last targeted session. Pt also required Mod A verbal cues to identify problems in problem identification picture cards. Pt left sitting in wheelchair with seatbelt alarm in place and all needs within reach. Continue per current plan of care.        Pain Pain Assessment Pain Scale: 0-10 Pain Score: 0-No pain  Therapy/Group: Individual Therapy  Arbutus Leas 07/29/2019, 3:04 PM

## 2019-07-29 NOTE — Progress Notes (Addendum)
[place] PHYSICAL MEDICINE & REHABILITATION PROGRESS NOTE   Subjective/Complaints:  Remains pleasantly confused  ROS: Patient denies , nausea, vomiting, diarrhea, cough, shortness of breath or chest pain, joint or back pain,    Objective:   No results found. No results for input(s): WBC, HGB, HCT, PLT in the last 72 hours. Recent Labs    07/27/19 1029  NA 141  K 3.7  CL 103  CO2 29  GLUCOSE 188*  BUN 21  CREATININE 1.34*  CALCIUM 8.9    Intake/Output Summary (Last 24 hours) at 07/29/2019 0855 Last data filed at 07/28/2019 2140 Gross per 24 hour  Intake 390 ml  Output 500 ml  Net -110 ml     Physical Exam: Vital Signs Blood pressure (!) 157/50, pulse (!) 53, temperature 98.1 F (36.7 C), resp. rate 18, SpO2 100 %.   Constitutional: No distress . Vital signs reviewed. HEENT: EOMI, oral membranes moist Neck: supple Cardiovascular: RRR without murmur. No JVD    Respiratory: CTA Bilaterally without wheezes or rales. Normal effort    GI: BS +, non-tender, non-distended  Extremities: No clubbing, cyanosis, or edema Skin: No evidence of breakdown, no evidence of rash Neurologic: Cranial nerves II through XII intact, motor strength is 5/5 in bilateral deltoid, bicep, tricep, grip, hip flexor, knee extensors, ankle dorsiflexor and plantar flexor Oriented to person Nl sensory exam Musculoskeletal: full ROM without pain    Assessment/Plan: 1. Functional deficits secondary to bilateral cortical and subcortical infarcts  which require 3+ hours per day of interdisciplinary therapy in a comprehensive inpatient rehab setting.  Physiatrist is providing close team supervision and 24 hour management of active medical problems listed below.  Physiatrist and rehab team continue to assess barriers to discharge/monitor patient progress toward functional and medical goals  Care Tool:  Bathing    Body parts bathed by patient: Right arm, Left arm, Chest, Abdomen, Front  perineal area, Buttocks, Right upper leg, Left upper leg, Face   Body parts bathed by helper: Right lower leg, Buttocks Body parts n/a: Right lower leg, Left lower leg   Bathing assist Assist Level: Minimal Assistance - Patient > 75%     Upper Body Dressing/Undressing Upper body dressing   What is the patient wearing?: Pull over shirt    Upper body assist Assist Level: Supervision/Verbal cueing    Lower Body Dressing/Undressing Lower body dressing      What is the patient wearing?: Pants, Incontinence brief     Lower body assist Assist for lower body dressing: Moderate Assistance - Patient 50 - 74%     Toileting Toileting Toileting Activity did not occur Landscape architect and hygiene only): Refused  Toileting assist Assist for toileting: Moderate Assistance - Patient 50 - 74%     Transfers Chair/bed transfer  Transfers assist     Chair/bed transfer assist level: Moderate Assistance - Patient 50 - 74%     Locomotion Ambulation   Ambulation assist      Assist level: Supervision/Verbal cueing Assistive device: Walker-rolling Max distance: 150   Walk 10 feet activity   Assist     Assist level: Supervision/Verbal cueing Assistive device: Walker-rolling   Walk 50 feet activity   Assist Walk 50 feet with 2 turns activity did not occur: Safety/medical concerns  Assist level: Supervision/Verbal cueing      Walk 150 feet activity   Assist Walk 150 feet activity did not occur: Safety/medical concerns  Assist level: Supervision/Verbal cueing      Walk 10 feet  on uneven surface  activity   Assist Walk 10 feet on uneven surfaces activity did not occur: Safety/medical concerns         Wheelchair     Assist Will patient use wheelchair at discharge?: No Type of Wheelchair: Manual    Wheelchair assist level: Minimal Assistance - Patient > 75% Max wheelchair distance: 100    Wheelchair 50 feet with 2 turns activity    Assist         Assist Level: Minimal Assistance - Patient > 75%   Wheelchair 150 feet activity     Assist  Wheelchair 150 feet activity did not occur: Safety/medical concerns       Blood pressure (!) 157/50, pulse (!) 53, temperature 98.1 F (36.7 C), resp. rate 18, SpO2 100 %.     Medical Problem List and Plan: 1.  Mild aphasia with decreased functional mobility secondary to small acute left basal ganglia corona radiata and multiple small subacute bilateral occipital and right frontal lobe infarcts ( ?embolic)  as well as history of TIA/CVA 2019 followed by Dr.Jaffe             -patient may shower             -ELOS/Goals: sup PT, OT, SLP due to cognition   Continue CIR PT, OT , SLP Team conference today please see physician documentation under team conference tab, met with team face-to-face to discuss problems,progress, and goals. Formulized individual treatment plan based on medical history, underlying problem and comorbidities. 2.  Antithrombotics: -DVT/anticoagulation: SCDs             -antiplatelet therapy: Aspirin 81 mg daily 3. Pain Management: Tylenol as needed 4. Mood: Provide emotional support             -antipsychotic agents: N/A 5. Neuropsych: This patient is capable of making decisions on his own behalf. 6. Skin/Wound Care: Routine skin checks 7. Fluids/Electrolytes/Nutrition: encourage PO  K+ 3.2 on 12/7--will supplement with 50mq BID for two doses and repeat BMP up to 3.7, increased aldactone should help preserve K+, BUN running higher than baseline   8.  Permissive hypertension.  Toprol-XL 50 mg daily,Cozaar 25 mg daily, Aldactone 50 mg daily.  Monitor with increased mobility Vitals:   07/28/19 2040 07/29/19 0439  BP: (!) 150/58 (!) 157/50  Pulse: 61 (!) 53  Resp: 18 18  Temp: (!) 97.5 F (36.4 C) 98.1 F (36.7 C)  SpO2: 99% 100%   Elevated systolic premedication this am on aldactone, cozaar and metoprolol , will monitor , check BMET may need to make further  adjustment  Increase cozaar 9.  Acute systolic heart failure.compensated   Follow-up per cardiology services.  Continue Aldactone as well as beta-blocker. Echo done on acute side shows EF 35=40% which was decreased from Echo 1 month prior with EF 55-60%. Clear exam There were no vitals filed for this visit.  Check weights 10.  Hyperlipidemia.  Lipitor 11.  Remote tobacco abuse.  Counseling 12.  Enterococcus UTI S to amp 7d course of amox ordered, through 12/13 freq voiding may be related to increased diuretic dose +/- BPH   PVR's low  12/13 still fairly frequent    OOB, toilet to void, double voids   LOS: 12 days A FACE TO FACE EVALUATION WAS PERFORMED  ACharlett Blake12/16/2020, 8:55 AM

## 2019-07-29 NOTE — Progress Notes (Signed)
Occupational Therapy Session Note  Patient Details  Name: Nicholas Hancock MRN: [id number] Date of Birth: [birth year]  Today's Date: 07/29/2019 OT Individual Time: 1030-1100 OT Individual Time Calculation (min): 30 min    Short Term Goals: Week 1:  OT Short Term Goal 1 (Week 1): Pt will complete toileting tasks with Mod A OT Short Term Goal 1 - Progress (Week 1): Met OT Short Term Goal 2 (Week 1): Pt will complete 1 grooming task at the sink while standing with no more than Mod A for balance OT Short Term Goal 2 - Progress (Week 1): Met OT Short Term Goal 3 (Week 1): Pt will don overhead shirt with Min A OT Short Term Goal 3 - Progress (Week 1): Met  Skilled Therapeutic Interventions/Progress Updates:    1:1. Pt agreeable to toilet with no c/o pain. Pt completes ambulatory transfer with RW into bathroom with CGA and Vc for keeping hands next to hips and feet inside RW. Pt requires CGA for all components of toileting and increased time for voiding bladder. Pt completes couch transfer with CGA for RW to couch and MIN A to boost up into standing getting off low surface. Education for having family with pt when mobilizing provided. Exited session with pt seated in w/c, belt alarm on and call light in reach  Therapy Documentation Precautions:  Precautions Precautions: Fall Restrictions Weight Bearing Restrictions: No General:   Vital Signs:  Pain: Pain Assessment Pain Scale: 0-10 Pain Score: 0-No pain ADL: ADL Grooming: Maximal assistance Where Assessed-Grooming: Edge of bed Upper Body Bathing: Supervision/safety Where Assessed-Upper Body Bathing: Edge of bed Lower Body Bathing: Maximal assistance Where Assessed-Lower Body Bathing: Edge of bed Upper Body Dressing: Moderate assistance Where Assessed-Upper Body Dressing: Other (Comment)(sitting on BSC) Lower Body Dressing: Dependent Where Assessed-Lower Body Dressing: Other (Comment)(sitting on BSC) Toileting: Maximal  assistance Where Assessed-Toileting: Bedside Commode Toilet Transfer: Moderate assistance Toilet Transfer Method: Stand pivot(RW) Science writer: Radiographer, therapeutic: Not assessed Vision   Perception    Praxis   Exercises:   Other Treatments:     Therapy/Group: Individual Therapy  [name] 07/29/2019, 10:56 AM

## 2019-07-29 NOTE — Progress Notes (Signed)
Recreational Therapy Session Note  Patient Details  Name: Nicholas Hancock MRN: [id number] Date of Birth: [birth year] Today's Date: 07/29/2019 TR eval defered as pt is not appropriate for TR services.  Discharge date is set for 12/21.  Will monitor through team. [name] 07/29/2019, 4:06 PM

## 2019-07-29 NOTE — Progress Notes (Signed)
Physical Therapy Session Note  Patient Details  Name: Nicholas Hancock MRN: [id number] Date of Birth: 10-24-1934  Today's Date: 07/29/2019 PT Individual Time: 0900-1000 PT Individual Time Calculation (min): 60 min   Short Term Goals: Week 2:  PT Short Term Goal 1 (Week 2): STG=LTG due to ELOS  Skilled Therapeutic Interventions/Progress Updates:   Pt received supine in bed and agreeable to PT. Supine>sit transfer with supervision assist and min cues for technique from semireclined position.   Stand pivot transfer to [facility] with min assist to prevent posterior LOB as well as AD management in turns.   Pt transported to rehab gym in [facility]. Sit<>stand fro [facility] without RW x 5 with min assist and then additional 5 with RW   Gait training with RW 2 x 181f with supervision assist fading to min assist for AD management with turn to sit in [place] Pt noted to have increasing festination of pattern with turns and when distracted by environment. Mild improvement in posture when instructed to keep hands near hips with RW.   Dynamic gait training in parallel bars forward/reverse and side stepping 4 x 855feach direction with supervision assist and moderate cues for posture and step length. Noted improvement with increased repetition   [facility] and all needs met.         Therapy Documentation Precautions:  Precautions Precautions: Fall Restrictions Weight Bearing Restrictions: No   Pain: Pain Assessment Pain Scale: 0-10 Pain Score: 0-No pain    Therapy/Group: Individual Therapy  AuLorie Phenix2/16/2020, 10:43 AM

## 2019-07-29 NOTE — Progress Notes (Signed)
Speech Language Pathology Daily Session Note  Patient Details  Name: Nicholas Hancock MRN: [id number] Date of Birth: 01-16-1935  Today's Date: 07/29/2019 SLP Individual Time: 1100-1130 SLP Individual Time Calculation (min): 30 min  Short Term Goals: Week 2: SLP Short Term Goal 1 (Week 2): Pt will sustain attention to basic familiar task for 3 minutes with Min A cues. SLP Short Term Goal 2 (Week 2): Given Mod A cues, pt will utilize external memory aids to recall orientation information. SLP Short Term Goal 3 (Week 2): Pt will complete basic familiar problem solving tasks with Min A cues. SLP Short Term Goal 4 (Week 2): Pt will consume current diet with minimal overt s/s of aspiration and Supervision A cues for use of compensatory swallow strategies (specifically not talking).  Skilled Therapeutic Interventions: Pt was seen for skilled ST targeting cognitive goals. Pt independently oriented to place and situation, also oriented to season however Min A verbal cues required to orient to month, Max A verbal and visual cues required for pt to orient to specific date on calendar. During a basic color sorting task (field of 6), pt required overall Mod A for problem solving and working memory throughout task. His accuracy improved when cued to say color aloud prior to attempting to match/placing it in a pile. Pt selectively attended to tasks for ~5 minute intervals with Min A verbal cues for redirection. Pt left sitting in wheelchair with seatbelt alarm in place and all needs within reach. Continue per current plan of care.       Pain Pain Assessment Pain Scale: 0-10 Pain Score: 0-No pain  Therapy/Group: Individual Therapy  Arbutus Leas 07/29/2019, 12:15 PM

## 2019-07-29 NOTE — Progress Notes (Signed)
Social Work Patient ID: Nicholas Hancock, male   DOB: Jan 02, 1935, 84 y.o.   MRN: [id number]  Met with pt and spoke with wife to discuss team conference progression toward his goals of supervision and discharge still 12/21. Discussed family education she can be here tomorrow at 10:00 will schedule his therapy for then. She is concerned regarding his fall two days ago and how can this happen with a telesitter in his room. She wants to talk with someone tomorrow about this. Work toward discharge Monday.

## 2019-07-29 NOTE — Patient Care Conference (Signed)
Inpatient RehabilitationTeam Conference and Plan of Care Update Date: 07/29/2019   Time: 10:05 AM   Patient Name: Nicholas Hancock      Medical Record Number: [id number]  Date of Birth: Nov 14, 1934 Sex: Male         Room/Bed: 4W19C/4W19C-01 Payor Info: Payor: Advertising copywriter MEDICARE / Plan: [facility] MEDICARE / Product Type: *No Product type* /    Admit Date/Time:  07/17/2019 11:51 AM  Primary Diagnosis:  Left basal ganglia embolic stroke [facility])  Patient Active Problem List   Diagnosis Date Noted  . Left basal ganglia embolic stroke (HCC) 07/17/2019  . Atrial tachycardia (HCC) 07/15/2019  . Chronic systolic CHF (congestive heart failure) (HCC) 07/15/2019  . Acute CVA (cerebrovascular accident) (HCC) 07/13/2019  . Hypertension   . Vitamin D deficiency   . Adrenal gland dysfunction (HCC)   . Aortic valve insufficiency   . 1st degree AV block   . TIA (transient ischemic attack) 07/12/2019  . 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 Bilateral inguinal hernia-left>right 06/13/2012  . Benign hypertensive heart disease without heart failure 05/22/2011    Expected Discharge Date: Expected Discharge Date: 08/03/19  Team Members Present: Physician leading conference: Dr. Claudette Laws Social Worker Present: Dossie Der, LCSW Nurse Present: Gerre Couch, RN Case Manager: Roderic Palau, RN; PT Present: Grier Rocher, PT;Rosita Dechalus, PTA OT Present: Roney Mans, OT SLP Present: Colin Benton, SLP PPS Coordinator present : Fae Pippin, SLP     Current Status/Progress Goal Weekly Team Focus  Bowel/Bladder   pt contient at times, [facility] 07/26/2019  time tolieting ,and prn meds to help bowels become regular  assess toliet q shift and prn   Swallow/Nutrition/ Hydration   Reg/thin, Supervision A  Mod I  carryover swallow strategies, tolerance reg/thin   ADL's   Min A bathing at shower level, Supervision UB dressing, Mod A LB dressing, Steady assist ambulatory bathroom transfers  using RW  Supervision  Functional transfers, posture, dynamic balance, ADL retraining, NMR   Mobility   CGA stand pivot and ambulatory transfers with RW, CGA gait with RW up to 167ft, continues to require increased time for initiation and demonstrates shuffling gait with poor B foot clearance.  supervision assist  safety awareness, transfers, gait, balance   Communication   Supervision A speech intelligibly when fully alert, 90% convo level  Supervision A  carryover speech intelligibility strategies   Safety/Cognition/ Behavioral Observations  Mod A basic cognition  Min A  basic problem solving, sustained attention, short term recall, education ongoing with wife   Pain   no c/o of pain, has prns  remain pain free  assess pain q shift and prn   Skin   rash on upper back  prevent further skin breakdown  assess skin q shift and prn    Rehab Goals Patient on target to meet rehab goals: Yes *See Care Plan and progress notes for long and short-term goals.     Barriers to Discharge  Current Status/Progress Possible Resolutions Date Resolved   Nursing                  PT                    OT                  SLP                SW  Discharge Planning/Teaching Needs:  Will have wife go through family training in prepartion for DC 12/21. Pt has made good progress but still needs cues and reminders.      Team Discussion: Confused, monitoring BP, K+ corrected.  RN confusion, ativan last night, cont/incontinent, rash on back improving.  OT min A bathing, S UB and Mod A LB dressing, amb RN steady assist, goals S.  PT S when awake, CGA for safety RW, goals S.  SLP downgrade cognition goals to min, currently mod A, swallow S reg/thins, goals mod I.  Wife will need fam ed.   Revisions to Treatment Plan: N/A     Medical Summary Current Status: confusion unchanged, no agitation , HTN Weekly Focus/Goal: monitoring BP  Barriers to Discharge: Medical stability   Possible  Resolutions to Barriers: HTN management   Continued Need for Acute Rehabilitation Level of Care: The patient requires daily medical management by a physician with specialized training in physical medicine and rehabilitation for the following reasons: Direction of a multidisciplinary physical rehabilitation program to maximize functional independence : Yes Medical management of patient stability for increased activity during participation in an intensive rehabilitation regime.: Yes Analysis of laboratory values and/or radiology reports with any subsequent need for medication adjustment and/or medical intervention. : Yes   I attest that I was present, lead the team conference, and concur with the assessment and plan of the team.   Jodell Cipro M 07/29/2019, 3:08 PM  Team conference was held via web/ teleconference due to [place] - 19

## 2019-07-29 NOTE — Plan of Care (Signed)
  Problem: Consults Goal: RH STROKE PATIENT EDUCATION Description: See Patient Education module for education specifics  Outcome: Progressing   Problem: RH BOWEL ELIMINATION Goal: RH STG MANAGE BOWEL WITH ASSISTANCE Description: STG Manage Bowel with min/mod Assistance. Outcome: Progressing   Problem: RH BLADDER ELIMINATION Goal: RH STG MANAGE BLADDER WITH ASSISTANCE Description: STG Manage Bladder With min/mod Assistance Outcome: Progressing   Problem: RH SKIN INTEGRITY Goal: RH STG SKIN FREE OF INFECTION/BREAKDOWN Description: Patients skin will remain free from further breakdown or injury during rehab stay with min/mod assist. Outcome: Progressing Goal: RH STG MAINTAIN SKIN INTEGRITY WITH ASSISTANCE Description: STG Maintain Skin Integrity With min/mod Assistance. Outcome: Progressing   Problem: RH SAFETY Goal: RH STG ADHERE TO SAFETY PRECAUTIONS W/ASSISTANCE/DEVICE Description: STG Adhere to Safety Precautions With supervision/min Assistance/Device. Outcome: Progressing   Problem: RH COGNITION-NURSING Goal: RH STG USES MEMORY AIDS/STRATEGIES W/ASSIST TO PROBLEM SOLVE Description: STG Uses Memory Aids/Strategies With supervision/ min Assistance to Problem Solve. Outcome: Progressing   Problem: RH KNOWLEDGE DEFICIT Goal: RH STG INCREASE KNOWLEDGE OF HYPERTENSION Description: Patient and caregiver will verbalize understanding of HTN including monitoring, medications, diet, exercise, and follow up care with min assist. Outcome: Progressing Goal: RH STG INCREASE KNOWLEGDE OF HYPERLIPIDEMIA Description: Patient and caregiver will verbalize understanding of HLD including monitoring, medications, diet, exercise, and follow up care with min assist. Outcome: Progressing Goal: RH STG INCREASE KNOWLEDGE OF STROKE PROPHYLAXIS Description: Patient and caregiver will verbalize understanding of stroke prophylaxis including monitoring, medications, diet, exercise, and follow up care with  min assist. Outcome: Progressing   

## 2019-07-30 ENCOUNTER — Ambulatory Visit (HOSPITAL_COMMUNITY): Payer: Medicare Other | Admitting: Physical Therapy

## 2019-07-30 ENCOUNTER — Encounter (HOSPITAL_COMMUNITY): Payer: Medicare Other

## 2019-07-30 ENCOUNTER — Encounter (HOSPITAL_COMMUNITY): Payer: Medicare Other | Admitting: Speech Pathology

## 2019-07-30 MED ORDER — LOSARTAN POTASSIUM 50 MG PO TABS
50.0000 mg | ORAL_TABLET | Freq: Every day | ORAL | Status: DC
  Administered 2019-07-31 – 2019-08-03 (×4): 50 mg via ORAL
  Filled 2019-07-30 (×4): qty 1

## 2019-07-30 NOTE — Progress Notes (Signed)
Speech Language Pathology Weekly Progress and Session Note  Patient Details  Name: Nicholas Hancock MRN: [id number] Date of Birth: [birth year]  Beginning of progress report period: July 24, 2019 End of progress report period: July 30, 2019  Today's Date: 07/30/2019 SLP Individual Time: 1300-1356 SLP Individual Time Calculation (min): 56 min  Short Term Goals: Week 2: SLP Short Term Goal 1 (Week 2): Pt will sustain attention to basic familiar task for 3 minutes with Min A cues. SLP Short Term Goal 1 - Progress (Week 2): Met SLP Short Term Goal 2 (Week 2): Given Mod A cues, pt will utilize external memory aids to recall orientation information. SLP Short Term Goal 2 - Progress (Week 2): Progressing toward goal SLP Short Term Goal 3 (Week 2): Pt will complete basic familiar problem solving tasks with Min A cues. SLP Short Term Goal 3 - Progress (Week 2): Progressing toward goal SLP Short Term Goal 4 (Week 2): Pt will consume current diet with minimal overt s/s of aspiration and Supervision A cues for use of compensatory swallow strategies (specifically not talking). SLP Short Term Goal 4 - Progress (Week 2): Met    New Short Term Goals: Week 3: SLP Short Term Goal 1 (Week 3): STG=LTG due to remaining LOS  Weekly Progress Updates: Pt has made slow but functional gains and met 2 out of 4 short term goals. Pt is currently overall Mod assist for due to cognitive impairments impacting basic problem solving, sustained attention, short term memory and orientation. Pt is consuming regular diet with thin liquids and is Supervision A for use of swallow strategies. Pt has demonstrated improved sustained attention and use of compensatory swallow strategies. Pt and family education is complete. Pt would continue to benefit from skilled ST while inpatient in order to maximize functional independence and reduce burden of care prior to discharge. Anticipate that pt will need 24/7 supervision at  discharge in addition to [place] follow up at next level of care.      Intensity: Minumum of 1-2 x/day, 30 to 90 minutes Frequency: 3 to 5 out of 7 days Duration/Length of Stay: 12/21 Treatment/Interventions: Cognitive remediation/compensation;Cueing hierarchy;Dysphagia/aspiration precaution training;Functional tasks;Patient/family education;Internal/external aids;Environmental controls;Speech/Language facilitation   Daily Session  Skilled Therapeutic Interventions: Pt was seen for skilled ST targeting pt and family education with pt's wife. SLP reviewed impact of cognitive changes on daily living post stroke, including need for 24/7 supervision for basic cognitive tasks and during meals to ensure safe intake; pt's wife verbally acknowledged and confident in her ability to provide necessary level of assist. Also provided verbal explanation and handout regarding compensatory memory strategies, including concrete of examples of how to implement at home. SLP emphasized routines and need to complete one task a a time. SLP verbally reviewed speech intelligibility strategies, which pt is consistently using with Supervision A to achieve ~90% intelligibility at conversation level. Pt's wife expressed excitement that his children could understand his speech much better over the phone recently. All questions regarding diet, speech, and cognition answered to pt and his wife's satisfaction. Pt left sitting in wheelchair with seatbelt alarm in place and all needs within reach, wife still present. Continue per current plan of care.       Pain Pain Assessment Pain Scale: Faces Faces Pain Scale: No hurt  Therapy/Group: Individual Therapy  Arbutus Leas 07/30/2019, 7:30 AM

## 2019-07-30 NOTE — Progress Notes (Signed)
Social Work Patient ID: Nicholas Hancock, male   DOB: 1934-12-18, 84 y.o.   MRN: [id number]  Met with pt and wife who is here for education today with therapies. She reports it is going well. Discussed equipment needs-would benefit from a tub seat and transport chair. Aware tub seat is not covered and still wants worker to get for them. Wife aware of pt's need for 24 hr supervision. [facility] will be providing the follow up therapies and contact wife to set this up. Work toward discharge Monday.

## 2019-07-30 NOTE — Progress Notes (Signed)
Physical Therapy Session Note  Patient Details  Name: Nicholas Hancock MRN: [id number] Date of Birth: April 11, 1935  Today's Date: 07/30/2019 PT Individual Time: 1005-1105 PT Individual Time Calculation (min): 60 min   Short Term Goals: Week 2:  PT Short Term Goal 1 (Week 2): STG=LTG due to ELOS  Skilled Therapeutic Interventions/Progress Updates:   Pt received supine in bed and agreeable to PT. Supine>sit transfer with supevision  assist and only min cues for technique to come fully to EOB prior to attempting transfer. Pt's wife present for family education.   Gait training with RW 4 x 26f with supervision assist from PT and from pt's wife with only min cues for safety to remain close to center of RW and increased step length to prevent shuffling gait pattern. Pt also ambulated up/down ramp with RW and supervision assist from PT with cues for AD management onto ramp.   Stair management training up/down curb step with RW x 4 with CGA for AD management and min cues for safety and technique.   Bed mobility on and of mat table to simulate bed at home without rail. Sit<>supine with supervision assist through log roll technique with min cues for rolling technique. Wife educated in proper instruction of log roll technique due to intermittent motor planning deficits when fatigued.   Car transfer completed x 2 with supervision assist from PT and then from wife. Instruction from PT to improve sequencing to sit prior to managing BLE into car for increased safety.   [facility] and all needs met.         Therapy Documentation Precautions:  Precautions Precautions: Fall Restrictions Weight Bearing Restrictions: No Pain:   denies   Therapy/Group: Individual Therapy  ALorie Phenix12/17/2020, 11:06 AM

## 2019-07-30 NOTE — Progress Notes (Signed)
Occupational Therapy Session Note  Patient Details  Name: Nicholas Hancock MRN: [id number] Date of Birth: [birth year]  Today's Date: 07/30/2019 OT Individual Time: 1100-1155 OT Individual Time Calculation (min): 55 min    Short Term Goals: Week 1:  OT Short Term Goal 1 (Week 1): Pt will complete toileting tasks with Mod A OT Short Term Goal 1 - Progress (Week 1): Met OT Short Term Goal 2 (Week 1): Pt will complete 1 grooming task at the sink while standing with no more than Mod A for balance OT Short Term Goal 2 - Progress (Week 1): Met OT Short Term Goal 3 (Week 1): Pt will don overhead shirt with Min A OT Short Term Goal 3 - Progress (Week 1): Met  Skilled Therapeutic Interventions/Progress Updates:    1:1. Pt received in w/c with no pain reported and wife present for family education. Wife educated on shower stall transfers, [place], [place] for transfers, attention, and shower adaptations to decrease risk of falling in bathroom. OT demo use of posterior method using RW to enter walk in shower and wife able to return demo with no VC. Wife cues appropriately throughout. Pt and wife decline practicing toilet transfer at this time. Pt and wife educated on energy conservation and provided with handout for further review. All questions answered at this time and pt left in w/c, call light in reach and belt alarm on  Therapy Documentation Precautions:  Precautions Precautions: Fall Restrictions Weight Bearing Restrictions: No General:   Vital Signs:  Pain:   ADL: ADL Grooming: Maximal assistance Where Assessed-Grooming: Edge of bed Upper Body Bathing: Supervision/safety Where Assessed-Upper Body Bathing: Edge of bed Lower Body Bathing: Maximal assistance Where Assessed-Lower Body Bathing: Edge of bed Upper Body Dressing: Moderate assistance Where Assessed-Upper Body Dressing: Other (Comment)(sitting on BSC) Lower Body Dressing: Dependent Where Assessed-Lower Body Dressing:  Other (Comment)(sitting on BSC) Toileting: Maximal assistance Where Assessed-Toileting: Bedside Commode Toilet Transfer: Moderate assistance Toilet Transfer Method: Stand pivot(RW) Science writer: Radiographer, therapeutic: Not assessed Vision   Perception    Praxis   Exercises:   Other Treatments:     Therapy/Group: Individual Therapy  [name] 07/30/2019, 11:48 AM

## 2019-07-30 NOTE — Progress Notes (Signed)
[place] PHYSICAL MEDICINE & REHABILITATION PROGRESS NOTE   Subjective/Complaints:  Taking medications from nursing, no new issues noted.  ROS: Patient denies , nausea, vomiting, diarrhea, cough, shortness of breath or chest pain, joint or back pain,    Objective:   No results found. No results for input(s): WBC, HGB, HCT, PLT in the last 72 hours. Recent Labs    07/27/19 1029  NA 141  K 3.7  CL 103  CO2 29  GLUCOSE 188*  BUN 21  CREATININE 1.34*  CALCIUM 8.9    Intake/Output Summary (Last 24 hours) at 07/30/2019 0936 Last data filed at 07/30/2019 7673 Gross per 24 hour  Intake 766 ml  Output 600 ml  Net 166 ml     Physical Exam: Vital Signs Blood pressure (!) 166/55, pulse (!) 50, temperature 98.1 F (36.7 C), resp. rate 16, SpO2 99 %.   Constitutional: No distress . Vital signs reviewed. HEENT: EOMI, oral membranes moist Neck: supple Cardiovascular: RRR without murmur. No JVD    Respiratory: CTA Bilaterally without wheezes or rales. Normal effort    GI: BS +, non-tender, non-distended  Extremities: No clubbing, cyanosis, or edema Skin: No evidence of breakdown, no evidence of rash Neurologic: Cranial nerves II through XII intact, motor strength is 5/5 in bilateral deltoid, bicep, tricep, grip, hip flexor, knee extensors, ankle dorsiflexor and plantar flexor Oriented to person Nl sensory exam Musculoskeletal: full ROM without pain    Assessment/Plan: 1. Functional deficits secondary to bilateral cortical and subcortical infarcts  which require 3+ hours per day of interdisciplinary therapy in a comprehensive inpatient rehab setting.  Physiatrist is providing close team supervision and 24 hour management of active medical problems listed below.  Physiatrist and rehab team continue to assess barriers to discharge/monitor patient progress toward functional and medical goals  Care Tool:  Bathing    Body parts bathed by patient: Right arm, Left arm,  Chest, Abdomen, Front perineal area, Buttocks, Right upper leg, Left upper leg, Face   Body parts bathed by helper: Right lower leg, Buttocks Body parts n/a: Right lower leg, Left lower leg   Bathing assist Assist Level: Minimal Assistance - Patient > 75%     Upper Body Dressing/Undressing Upper body dressing   What is the patient wearing?: Pull over shirt    Upper body assist Assist Level: Supervision/Verbal cueing    Lower Body Dressing/Undressing Lower body dressing      What is the patient wearing?: Pants, Incontinence brief     Lower body assist Assist for lower body dressing: Moderate Assistance - Patient 50 - 74%     Toileting Toileting Toileting Activity did not occur Landscape architect and hygiene only): Refused  Toileting assist Assist for toileting: Moderate Assistance - Patient 50 - 74%     Transfers Chair/bed transfer  Transfers assist     Chair/bed transfer assist level: Moderate Assistance - Patient 50 - 74%     Locomotion Ambulation   Ambulation assist      Assist level: Supervision/Verbal cueing Assistive device: Walker-rolling Max distance: 150   Walk 10 feet activity   Assist     Assist level: Supervision/Verbal cueing Assistive device: Walker-rolling   Walk 50 feet activity   Assist Walk 50 feet with 2 turns activity did not occur: Safety/medical concerns  Assist level: Supervision/Verbal cueing      Walk 150 feet activity   Assist Walk 150 feet activity did not occur: Safety/medical concerns  Assist level: Supervision/Verbal cueing  Walk 10 feet on uneven surface  activity   Assist Walk 10 feet on uneven surfaces activity did not occur: Safety/medical concerns         Wheelchair     Assist Will patient use wheelchair at discharge?: No Type of Wheelchair: Manual    Wheelchair assist level: Minimal Assistance - Patient > 75% Max wheelchair distance: 100    Wheelchair 50 feet with 2 turns  activity    Assist        Assist Level: Minimal Assistance - Patient > 75%   Wheelchair 150 feet activity     Assist  Wheelchair 150 feet activity did not occur: Safety/medical concerns       Blood pressure (!) 166/55, pulse (!) 50, temperature 98.1 F (36.7 C), resp. rate 16, SpO2 99 %.     Medical Problem List and Plan: 1.  Mild aphasia with decreased functional mobility secondary to small acute left basal ganglia corona radiata and multiple small subacute bilateral occipital and right frontal lobe infarcts ( ?embolic)  as well as history of TIA/CVA 2019 followed by Dr.Jaffe             -patient may shower             -ELOS/Goals: sup PT, OT, SLP due to cognition   Continue CIR PT, OT , SLP  2.  Antithrombotics: -DVT/anticoagulation: SCDs             -antiplatelet therapy: Aspirin 81 mg daily 3. Pain Management: Tylenol as needed 4. Mood: Provide emotional support             -antipsychotic agents: N/A 5. Neuropsych: This patient is capable of making decisions on his own behalf. 6. Skin/Wound Care: Routine skin checks 7. Fluids/Electrolytes/Nutrition: encourage PO  K+ 3.2 on 12/7--will supplement with BID for two doses and repeat BMP up to 3.7, increased aldactone should help preserve K+, BUN running higher than baseline   8.  Permissive hypertension.  Toprol-XL 50 mg daily,Cozaar 25 mg daily, Aldactone 50 mg daily.  Monitor with increased mobility Vitals:   07/29/19 1948 07/30/19 0423  BP: (!) 154/46 (!) 166/55  Pulse: 64 (!) 50  Resp: 18 16  Temp: 98.1 F (36.7 C) 98.1 F (36.7 C)  SpO2: 100% 99%   Systolic hypertension this am on aldactone, cozaar and metoprolol , will increase Cozaar to 50 mg/day 9.  Acute systolic heart failure.compensated   Follow-up per cardiology services.  Continue Aldactone as well as beta-blocker. Echo done on acute side shows EF 35=40% which was decreased from Echo 1 month prior with EF 55-60%. Clear exam There were no  vitals filed for this visit.  Check weights 10.  Hyperlipidemia.  Lipitor 11.  Remote tobacco abuse.  Counseling 12.  Enterococcus UTI S to amp 7d course of amox ordered, through 12/13 freq voiding may be related to increased diuretic dose +/- BPH   PVR's low  12/13 still fairly frequent    OOB, toilet to void, double voids   LOS: 13 days A FACE TO FACE EVALUATION WAS PERFORMED  Nicholas Hancock 07/30/2019, 9:36 AM

## 2019-07-30 NOTE — Discharge Instructions (Signed)
Inpatient Rehab Discharge Instructions  Nicholas Hancock Discharge date and time: No discharge date for patient encounter.   Activities/Precautions/ Functional Status: Activity: activity as tolerated Diet: regular diet Wound Care: none needed Functional status:  ___ No restrictions     ___ Walk up steps independently ___ 24/7 supervision/assistance   ___ Walk up steps with assistance ___ Intermittent supervision/assistance  ___ Bathe/dress independently ___ Walk with walker     _x__ Bathe/dress with assistance ___ Walk Independently    ___ Shower independently ___ Walk with assistance    ___ Shower with assistance ___ No alcohol     ___ Return to work/school ________  Special Instructions:  No driving smoking or alcohol    COMMUNITY REFERRALS UPON DISCHARGE:    Outpatient: PT, OT, SP  Agency:[facility] [place]  [phone]  Date of Last Service:08/03/2019  Appointment Date/Time:WILL CONTACT WIFE TO ARRANGE APPOINTMENTS  Medical Equipment/Items Ordered:TUB SEAT & TRANSPORT CHAIR  Agency/Supplier:[facility]   [phone]   OTHER: WIFE TO LOOK INTO GETTING A SITTER FOR HUSBAND WHEN SHE HAS TO RUN ERRANDS    STROKE/TIA DISCHARGE INSTRUCTIONS SMOKING Cigarette smoking nearly doubles your risk of having a stroke & is the single most alterable risk factor  If you smoke or have smoked in the last 12 months, you are advised to quit smoking for your health.  Most of the excess cardiovascular risk related to smoking disappears within a year of stopping.  Ask you doctor about anti-smoking medications  [place] Quit Line: 1-800-QUIT NOW  Free Smoking Cessation Classes [phone]  CHOLESTEROL Know your levels; limit fat & cholesterol in your diet  Lipid Panel     Component Value Date/Time   CHOL 226 (H) 07/13/2019 0500   CHOL 184 12/26/2017 1053   TRIG 79 07/13/2019 0500   HDL 31 (L) 07/13/2019 0500   HDL 27 (L) 12/26/2017 1053   CHOLHDL 7.3 07/13/2019  0500   VLDL 16 07/13/2019 0500   LDLCALC 179 (H) 07/13/2019 0500   LDLCALC 129 (H) 12/26/2017 1053      Many patients benefit from treatment even if their cholesterol is at goal.  Goal: Total Cholesterol (CHOL) less than 160  Goal:  Triglycerides (TRIG) less than 150  Goal:  HDL greater than 40  Goal:  LDL (LDLCALC) less than 100   BLOOD PRESSURE American Stroke Association blood pressure target is less that 120/80 mm/Hg  Your discharge blood pressure is:  BP: (!) 140/52  Monitor your blood pressure  Limit your salt and alcohol intake  Many individuals will require more than one medication for high blood pressure  DIABETES (A1c is a blood sugar average for last 3 months) Goal HGBA1c is under 7% (HBGA1c is blood sugar average for last 3 months)  Diabetes: No known diagnosis of diabetes    Lab Results  Component Value Date   HGBA1C 5.9 (H) 07/13/2019     Your HGBA1c can be lowered with medications, healthy diet, and exercise.  Check your blood sugar as directed by your physician  Call your physician if you experience unexplained or low blood sugars.  PHYSICAL ACTIVITY/REHABILITATION Goal is 30 minutes at least 4 days per week  Activity: Increase activity slowly, Therapies: Physical Therapy: Home Health Return to work:   Activity decreases your risk of heart attack and stroke and makes your heart stronger.  It helps control your weight and blood pressure; helps you relax and can improve your mood.  Participate in a regular exercise  program.  Talk with your doctor about the best form of exercise for you (dancing, walking, swimming, cycling).  DIET/WEIGHT Goal is to maintain a healthy weight  Your discharge diet is:  Diet Order            Diet Heart Room service appropriate? Yes; Fluid consistency: Thin  Diet effective now              liquids Your height is:    Your current weight is:   Your Body Mass Index (BMI) is:     Following the type of diet specifically  designed for you will help prevent another stroke.  Your goal weight range is:    Your goal Body Mass Index (BMI) is 19-24.  Healthy food habits can help reduce 3 risk factors for stroke:  High cholesterol, hypertension, and excess weight.  RESOURCES Stroke/Support Group:  Call [phone]   STROKE EDUCATION PROVIDED/REVIEWED AND GIVEN TO PATIENT Stroke warning signs and symptoms How to activate emergency medical system (call 911). Medications prescribed at discharge. Need for follow-up after discharge. Personal risk factors for stroke. Pneumonia vaccine given:  Flu vaccine given:  My questions have been answered, the writing is legible, and I understand these instructions.  I will adhere to these goals & educational materials that have been provided to me after my discharge from the hospital.     My questions have been answered and I understand these instructions. I will adhere to these goals and the provided educational materials after my discharge from the hospital.  Patient/Caregiver Signature _______________________________ Date __________  Clinician Signature _______________________________________ Date __________  Please bring this form and your medication list with you to all your follow-up doctor's appointments.

## 2019-07-30 NOTE — Progress Notes (Signed)
Social Work Patient ID: Nicholas Hancock, male   DOB: Mar 03, 1935, 84 y.o.   MRN: [id number]     Diagnosis codes: I63.9, I50.22 & I10  Height: 5;8               Weight:   160 lbs        Patient suffers from [name]   which impairs his ability to perform daily activities like ADL's   in the home.  A walker  will not resolve issue with performing activities of daily living.  A wheelchair will allow patient to safely perform daily activities.  Patient has a caregiver to push him in the transport chair.

## 2019-07-31 ENCOUNTER — Inpatient Hospital Stay (HOSPITAL_COMMUNITY): Payer: Medicare Other | Admitting: Physical Therapy

## 2019-07-31 ENCOUNTER — Inpatient Hospital Stay (HOSPITAL_COMMUNITY): Payer: Medicare Other | Admitting: Speech Pathology

## 2019-07-31 ENCOUNTER — Inpatient Hospital Stay (HOSPITAL_COMMUNITY): Payer: Medicare Other | Admitting: Occupational Therapy

## 2019-07-31 LAB — BASIC METABOLIC PANEL
Anion gap: 10 (ref 5–15)
BUN: 16 mg/dL (ref 8–23)
CO2: 28 mmol/L (ref 22–32)
Calcium: 8.4 mg/dL — ABNORMAL LOW (ref 8.9–10.3)
Chloride: 104 mmol/L (ref 98–111)
Creatinine, Ser: 1.23 mg/dL (ref 0.61–1.24)
GFR calc Af Amer: >60 mL/min (ref >60)
GFR calc non Af Amer: 54 mL/min — ABNORMAL LOW (ref >60)
Glucose, Bld: 96 mg/dL (ref 70–99)
Potassium: 3.2 mmol/L — ABNORMAL LOW (ref 3.5–5.1)
Sodium: 142 mmol/L (ref 135–145)

## 2019-07-31 NOTE — Progress Notes (Signed)
[place] PHYSICAL MEDICINE & REHABILITATION PROGRESS NOTE   Subjective/Complaints:  Appreciate SW note  Pt remains pleasantly confused   ROS: Patient denies , nausea, vomiting, diarrhea, cough, shortness of breath or chest pain, joint or back pain,    Objective:   No results found. No results for input(s): WBC, HGB, HCT, PLT in the last 72 hours. No results for input(s): NA, K, CL, CO2, GLUCOSE, BUN, CREATININE, CALCIUM in the last 72 hours.  Intake/Output Summary (Last 24 hours) at 07/31/2019 0810 Last data filed at 07/31/2019 0748 Gross per 24 hour  Intake 400 ml  Output 600 ml  Net -200 ml     Physical Exam: Vital Signs Blood pressure (!) 158/49, pulse (!) 52, temperature 98 F (36.7 C), resp. rate 18, SpO2 97 %.   Constitutional: No distress . Vital signs reviewed. HEENT: EOMI, oral membranes moist Neck: supple Cardiovascular: RRR without murmur. No JVD    Respiratory: CTA Bilaterally without wheezes or rales. Normal effort    GI: BS +, non-tender, non-distended  Extremities: No clubbing, cyanosis, or edema Skin: No evidence of breakdown, no evidence of rash Neurologic: Cranial nerves II through XII intact, motor strength is 5/5 in bilateral deltoid, bicep, tricep, grip, hip flexor, knee extensors, ankle dorsiflexor and plantar flexor Oriented to person Nl sensory exam Musculoskeletal: full ROM without pain    Assessment/Plan: 1. Functional deficits secondary to bilateral cortical and subcortical infarcts  which require 3+ hours per day of interdisciplinary therapy in a comprehensive inpatient rehab setting.  Physiatrist is providing close team supervision and 24 hour management of active medical problems listed below.  Physiatrist and rehab team continue to assess barriers to discharge/monitor patient progress toward functional and medical goals  Care Tool:  Bathing    Body parts bathed by patient: Right arm, Left arm, Chest, Abdomen, Front perineal  area, Buttocks, Right upper leg, Left upper leg, Face   Body parts bathed by helper: Right lower leg, Buttocks Body parts n/a: Right lower leg, Left lower leg   Bathing assist Assist Level: Minimal Assistance - Patient > 75%     Upper Body Dressing/Undressing Upper body dressing   What is the patient wearing?: Pull over shirt    Upper body assist Assist Level: Supervision/Verbal cueing    Lower Body Dressing/Undressing Lower body dressing      What is the patient wearing?: Pants, Incontinence brief     Lower body assist Assist for lower body dressing: Moderate Assistance - Patient 50 - 74%     Toileting Toileting Toileting Activity did not occur Landscape architect and hygiene only): Refused  Toileting assist Assist for toileting: Moderate Assistance - Patient 50 - 74%     Transfers Chair/bed transfer  Transfers assist     Chair/bed transfer assist level: Moderate Assistance - Patient 50 - 74%     Locomotion Ambulation   Ambulation assist      Assist level: Supervision/Verbal cueing Assistive device: Walker-rolling Max distance: 150   Walk 10 feet activity   Assist     Assist level: Supervision/Verbal cueing Assistive device: Walker-rolling   Walk 50 feet activity   Assist Walk 50 feet with 2 turns activity did not occur: Safety/medical concerns  Assist level: Supervision/Verbal cueing      Walk 150 feet activity   Assist Walk 150 feet activity did not occur: Safety/medical concerns  Assist level: Supervision/Verbal cueing      Walk 10 feet on uneven surface  activity   Assist Walk 10  feet on uneven surfaces activity did not occur: Safety/medical concerns         Wheelchair     Assist Will patient use wheelchair at discharge?: No Type of Wheelchair: Manual    Wheelchair assist level: Minimal Assistance - Patient > 75% Max wheelchair distance: 100    Wheelchair 50 feet with 2 turns activity    Assist         Assist Level: Minimal Assistance - Patient > 75%   Wheelchair 150 feet activity     Assist  Wheelchair 150 feet activity did not occur: Safety/medical concerns       Blood pressure (!) 158/49, pulse (!) 52, temperature 98 F (36.7 C), resp. rate 18, SpO2 97 %.     Medical Problem List and Plan: 1.  Mild aphasia with decreased functional mobility secondary to small acute left basal ganglia corona radiata and multiple small subacute bilateral occipital and right frontal lobe infarcts ( ?embolic)  as well as history of TIA/CVA 2019 followed by [facility]             -patient may shower             -ELOS 12/21 Dolores Lory: sup PT, OT, SLP due to cognition   Continue CIR PT, OT , SLP  2.  Antithrombotics: -DVT/anticoagulation: SCDs             -antiplatelet therapy: Aspirin 81 mg daily 3. Pain Management: Tylenol as needed 4. Mood: Provide emotional support             -antipsychotic agents: N/A 5. Neuropsych: This patient is capable of making decisions on his own behalf. 6. Skin/Wound Care: Routine skin checks 7. Fluids/Electrolytes/Nutrition: encourage PO  K+ 3.2 on 12/7--will supplement with BID for two doses and repeat BMP up to 3.7, increased aldactone should help preserve K+, BUN running higher than baseline   8.  Permissive hypertension.  Toprol-XL 50 mg daily,Cozaar 25 mg daily, Aldactone 50 mg daily.  Monitor with increased mobility Vitals:   07/31/19 0521 07/31/19 0600  BP: (!) 158/49   Pulse: (!) 48 (!) 52  Resp: 18   Temp: 98 F (36.7 C)   SpO2: 97%    Systolic hypertension this am on aldactone, cozaar and metoprolol , will increase Cozaar to 50 mg/day 12/18 9.  Acute systolic heart failure.compensated   Follow-up per cardiology services.  Continue Aldactone as well as beta-blocker. Echo done on acute side shows EF 35=40% which was decreased from Echo 1 month prior with EF 55-60%. Clear exam There were no vitals filed for this visit.  Check weights 10.   Hyperlipidemia.  Lipitor 11.  Remote tobacco abuse.  Counseling 12.  Enterococcus UTI S to amp 7d course of amox completed    LOS: 14 days A FACE TO FACE EVALUATION WAS PERFORMED  Erick Colace 07/31/2019, 8:10 AM

## 2019-07-31 NOTE — Progress Notes (Signed)
Speech Language Pathology Daily Session Note  Patient Details  Name: Nicholas Hancock MRN: [id number] Date of Birth: [birth year]  Today's Date: 07/31/2019 SLP Individual Time: 1105-1200 SLP Individual Time Calculation (min): 55 min  Short Term Goals: Week 3: SLP Short Term Goal 1 (Week 3): STG=LTG due to remaining LOS  Skilled Therapeutic Interventions:  Skilled treatment session focused on cognition. Pt's wife present during session. SLP facilitated session by providing basic sorting task. SLP provided initial card (base card) for pt use as category reference. While pt was accurate with sorting the cards, he was extremely inefficient d/t deficits in sustained attention to task and very slow processing. Pt required moderate verbal cues for redirection to task given frequent stopping to straighten pile of cards. SLP also facilitated by providing Moderate verbal and visual cues to place cards into numerical sequence. Education provided to pt's wife on deficits that were being targeted. All questions answered to her satisfaction.       Pain    Therapy/Group: Individual Therapy  Cavan Bearden 07/31/2019, 12:25 PM

## 2019-07-31 NOTE — Progress Notes (Signed)
Physical Therapy Session Note  Patient Details  Name: Nicholas Hancock MRN: [id number] Date of Birth: [birth year]  Today's Date: 07/31/2019 PT Individual Time: 0900-1000 PT Individual Time Calculation (min): 60 min   Short Term Goals: Week 2:  PT Short Term Goal 1 (Week 2): STG=LTG due to ELOS  Skilled Therapeutic Interventions/Progress Updates:   Pt received supine in bed and agreeable to PT. Supine>sit transfer with supervision assist and min cues for log roll technique from flat surface to transition into sitting. Pt reports need for urination. Ambulatory transfer to toilet with RW and supervision assist for improved safety with AD management in turn to sit and for clothing management. Ambulatory transfer to [facility] with RW and supervision assist. PT assisted pt to don shoes for time management.   Gait training with RW x [id number] +[id number] supevision assist from PT for safety to improve AD management and improve turning technique to prevent lateral LOB by improvin gstep length on the RLE.   Dynamic balance training to complete pipe tree puzzle low and moderate difficulty without UE support, moderate cues for sequence development and problem solving on moderate difficultry puzzle. Pt also performed forward/lateral reach to obtain and place yellow and green clothes pins on horizontal bars x 10 BUE. supervision assist with lateral reaches with cues for improve L weight shifting.   [facility] and all needs met.          Therapy Documentation Precautions:  Precautions Precautions: Fall Restrictions Weight Bearing Restrictions: No Pain: denies   Therapy/Group: Individual Therapy  AuLorie Phenix2/18/2020, 10:05 AM

## 2019-07-31 NOTE — Progress Notes (Signed)
Occupational Therapy Session Note  Patient Details  Name: Nicholas Hancock MRN: [id number] Date of Birth: [birth year]  Today's Date: 07/31/2019 OT Individual Time: 1420-1535 OT Individual Time Calculation (min): 75 min   Short Term Goals: Week 2:  OT Short Term Goal 1 (Week 2): STGs=LTGs due to ELOS  Skilled Therapeutic Interventions/Progress Updates:    Pt greeted in w/c with spouse [name] present. She reported feeling a bit worried about assisting pt with B/D at home. Invited her to stay and observe/assist during session today, and she chose to observe for OT education. At end of session [name] stated she felt more comfortable assisting him at time of d/c. Pt completed showering, dressing, and oral care/grooming tasks during tx. All functional transfers completed using RW at ambulatory level with supervision assist. Spouse [name] able to appropriately cue pt several times to keep RW close to body. Pt able to doff his clothes with supervision and then bathe at sit<stand level with the same assist. Vcs for using the grab bar for support when standing. He attempted to stand several times during bathing and needed vcs to sit to increase safety. Discussed this with [name] and she verbalized understanding. Pt needed vcs to correctly orient his pants today when dressing sit<stand with RW. [name] stated he can dress sit<stand from toilet using RW at their ILF. Pt was able to don Rt gripper sock by propping foot up on the bed. [name] stated "that is such a good idea." Supervision for dynamic standing balance with device for support. He then completed oral care and handwashing while standing at the sink. He needed vcs for motor planning due to external distractibility and cognitive deficits. [name] reported that they had discussed functional implications of cognitive deficits during SLP earlier. This education was continued in the occupational context. [name] very appreciative of education, had no other questions for OT.  Left pt with all needs, safety belt fastened, and [name] still present.    Therapy Documentation Precautions:  Precautions Precautions: Fall Restrictions Weight Bearing Restrictions: No Vital Signs: Therapy Vitals Temp: (!) 97.5 F (36.4 C) Temp Source: Oral Pulse Rate: 71 Resp: 16 BP: (!) 134/46 Patient Position (if appropriate): Sitting Oxygen Therapy SpO2: 100 % O2 Device: Room Air Pain: No s/s pain during session    ADL: ADL Grooming: Maximal assistance Where Assessed-Grooming: Edge of bed Upper Body Bathing: Supervision/safety Where Assessed-Upper Body Bathing: Edge of bed Lower Body Bathing: Maximal assistance Where Assessed-Lower Body Bathing: Edge of bed Upper Body Dressing: Moderate assistance Where Assessed-Upper Body Dressing: Other (Comment)(sitting on BSC) Lower Body Dressing: Dependent Where Assessed-Lower Body Dressing: Other (Comment)(sitting on BSC) Toileting: Maximal assistance Where Assessed-Toileting: Bedside Commode Toilet Transfer: Moderate assistance Toilet Transfer Method: Stand pivot(RW) Toilet Transfer Equipment: Engineer, technical sales Transfer: Not assessed      Therapy/Group: Individual Therapy  Aryel Edelen A Mase Dhondt 07/31/2019, 4:03 PM

## 2019-08-01 ENCOUNTER — Inpatient Hospital Stay (HOSPITAL_COMMUNITY): Payer: Medicare Other | Admitting: Physical Therapy

## 2019-08-01 ENCOUNTER — Inpatient Hospital Stay (HOSPITAL_COMMUNITY): Payer: Medicare Other | Admitting: Occupational Therapy

## 2019-08-01 DIAGNOSIS — R4587 Impulsiveness: Secondary | ICD-10-CM

## 2019-08-01 DIAGNOSIS — E876 Hypokalemia: Secondary | ICD-10-CM

## 2019-08-01 DIAGNOSIS — R944 Abnormal results of kidney function studies: Secondary | ICD-10-CM

## 2019-08-01 DIAGNOSIS — I10 Essential (primary) hypertension: Secondary | ICD-10-CM

## 2019-08-01 DIAGNOSIS — I5021 Acute systolic (congestive) heart failure: Secondary | ICD-10-CM

## 2019-08-01 NOTE — Progress Notes (Signed)
[place] PHYSICAL MEDICINE & REHABILITATION PROGRESS NOTE   Subjective/Complaints: Patient seen laying in bed this morning.  No reported issues overnight.  Confused this morning.  ROS: Appears to deny CP, shortness of breath, nausea, vomiting, diarrhea.  Objective:   No results found. No results for input(s): WBC, HGB, HCT, PLT in the last 72 hours. Recent Labs    07/31/19 0618  NA 142  K 3.2*  CL 104  CO2 28  GLUCOSE 96  BUN 16  CREATININE 1.23  CALCIUM 8.4*    Intake/Output Summary (Last 24 hours) at 08/01/2019 1637 Last data filed at 08/01/2019 1300 Gross per 24 hour  Intake 637 ml  Output 600 ml  Net 37 ml     Physical Exam: Vital Signs Blood pressure (!) 177/60, pulse (!) 56, temperature 98.1 F (36.7 C), resp. rate 16, SpO2 99 %. Constitutional: No distress . Vital signs reviewed. HENT: Normocephalic.  Atraumatic. Eyes: EOMI. No discharge. Cardiovascular: No JVD. Respiratory: Normal effort.  No stridor. GI: Non-distended. Skin: Warm and dry.  Intact. Psych: Confused Musc: No edema in extremities.  No tenderness in extremities. Neuro: Alert Follows limited commands, but appears to be 5/5 throughout  Assessment/Plan: 1. Functional deficits secondary to bilateral cortical and subcortical infarcts  which require 3+ hours per day of interdisciplinary therapy in a comprehensive inpatient rehab setting.  Physiatrist is providing close team supervision and 24 hour management of active medical problems listed below.  Physiatrist and rehab team continue to assess barriers to discharge/monitor patient progress toward functional and medical goals  Care Tool:  Bathing    Body parts bathed by patient: Right arm, Left arm, Chest, Abdomen, Front perineal area, Buttocks, Right upper leg, Left upper leg, Face, Right lower leg, Left lower leg   Body parts bathed by helper: Right lower leg, Buttocks Body parts n/a: Right lower leg, Left lower leg   Bathing assist  Assist Level: Supervision/Verbal cueing     Upper Body Dressing/Undressing Upper body dressing   What is the patient wearing?: Pull over shirt    Upper body assist Assist Level: Supervision/Verbal cueing    Lower Body Dressing/Undressing Lower body dressing      What is the patient wearing?: Pants, Incontinence brief     Lower body assist Assist for lower body dressing: Supervision/Verbal cueing     Toileting Toileting Toileting Activity did not occur (Clothing management and hygiene only): Refused  Toileting assist Assist for toileting: Moderate Assistance - Patient 50 - 74%     Transfers Chair/bed transfer  Transfers assist     Chair/bed transfer assist level: Supervision/Verbal cueing     Locomotion Ambulation   Ambulation assist      Assist level: Supervision/Verbal cueing Assistive device: Walker-rolling Max distance: 200   Walk 10 feet activity   Assist     Assist level: Supervision/Verbal cueing Assistive device: Walker-rolling   Walk 50 feet activity   Assist Walk 50 feet with 2 turns activity did not occur: Safety/medical concerns  Assist level: Supervision/Verbal cueing Assistive device: Walker-rolling    Walk 150 feet activity   Assist Walk 150 feet activity did not occur: Safety/medical concerns  Assist level: Supervision/Verbal cueing Assistive device: Walker-rolling    Walk 10 feet on uneven surface  activity   Assist Walk 10 feet on uneven surfaces activity did not occur: Safety/medical concerns   Assist level: Supervision/Verbal cueing Assistive device: Aeronautical engineer Will patient use wheelchair at discharge?: No Type  of Wheelchair: Manual    Wheelchair assist level: Minimal Assistance - Patient > 75% Max wheelchair distance: 100    Wheelchair 50 feet with 2 turns activity    Assist        Assist Level: Minimal Assistance - Patient > 75%   Wheelchair 150 feet activity      Assist  Wheelchair 150 feet activity did not occur: Safety/medical concerns       Blood pressure (!) 177/60, pulse (!) 56, temperature 98.1 F (36.7 C), resp. rate 16, SpO2 99 %.     Medical Problem List and Plan: 1.  Mild aphasia with decreased functional mobility secondary to small acute left basal ganglia corona radiata and multiple small subacute bilateral occipital and right frontal lobe infarcts ( ?embolic)  as well as history of TIA/CVA 2019 followed by Dr.Jaffe            Continue CIR 2.  Antithrombotics: -DVT/anticoagulation: SCDs             -antiplatelet therapy: Aspirin 81 mg daily 3. Pain Management: Tylenol as needed 4. Mood: Provide emotional support             -antipsychotic agents: N/A 5. Neuropsych: This patient is not capable of making decisions on his own behalf.  Telesitter for safety due to impulsivity 6. Skin/Wound Care: Routine skin checks 7. Fluids/Electrolytes/Nutrition: encourage PO 8.  Hypertension.  Monitor with increased mobility Vitals:   08/01/19 0522 08/01/19 1336  BP: (!) 166/75 (!) 177/60  Pulse: 62 (!) 56  Resp: 18 16  Temp: 98 F (36.7 C) 98.1 F (36.7 C)  SpO2: 96% 99%   Aldactone, cozaar and metoprolol  Cozaar increased to 50 mg on 12/18, consider further increase tomorrow if persistently elevated 9.  Acute systolic heart failure.compensated   Follow-up per cardiology services.  Continue Aldactone as well as beta-blocker.  Echo showing EF of 35-40% which was decreased from Echo 1 month prior with EF 55-60%.  Daily weights ordered There were no vitals filed for this visit.   10.  Hyperlipidemia.  Lipitor 11.  Remote tobacco abuse.  Counseling 12.  Enterococcus UTI S to amp 7d course of amox completed   13. Hypokalemia  Potassium 3.2 on 12/18, labs ordered for Monday  Supplement initiated 14.  AKI versus CKD  GFR 54 on 12/18, labs ordered for Monday  LOS: 15 days A FACE TO FACE EVALUATION WAS PERFORMED  Zonya Gudger Karis Juba 08/01/2019, 4:37 PM

## 2019-08-01 NOTE — Progress Notes (Signed)
Occupational Therapy Session Note  Patient Details  Name: Nicholas Hancock MRN: [id number] Date of Birth: February 03, 1935  Today's Date: 08/01/2019 OT Individual Time: 6979-4801 OT Individual Time Calculation (min): 32 min   Short Term Goals: Week 2:  OT Short Term Goal 1 (Week 2): STGs=LTGs due to ELOS  Skilled Therapeutic Interventions/Progress Updates:    Pt greeted in w/c with no c/o pain. Spouse present. Pt declined toileting but agreeable to brush his teeth. He ambulated to the sink with supervision using RW. Increased vcs for sequencing oral care and hand washing today. Spouse was on the phone and this seemed to distract him. Cued pt to return to w/c afterwards and he proceeded to walk into the bathroom, stating he thought the w/c was in there. Increased time for pt to figure out where his w/c was and then he transferred with vcs for hand placement during stand<sit transition. Discussed with [name] using meaningful sing-a-long music to help pt with expressive aphasia at home. We listened to a few songs during session with pt mouthing or verbalizing correct words 10% of the time. [name] stated they used to dance together and listen to music routinely, says she'll try to facilitate singing in the morning at home. Left pt with all needs within reach and safety belt fastened.   Therapy Documentation Precautions:  Precautions Precautions: Fall Restrictions Weight Bearing Restrictions: No Vital Signs: Therapy Vitals Temp: 98.1 F (36.7 C) Pulse Rate: (!) 56 Resp: 16 BP: (!) 177/60 Patient Position (if appropriate): Sitting Oxygen Therapy SpO2: 99 % O2 Device: Room Air ADL: ADL Grooming: Maximal assistance Where Assessed-Grooming: Edge of bed Upper Body Bathing: Supervision/safety Where Assessed-Upper Body Bathing: Edge of bed Lower Body Bathing: Maximal assistance Where Assessed-Lower Body Bathing: Edge of bed Upper Body Dressing: Moderate assistance Where Assessed-Upper Body  Dressing: Other (Comment)(sitting on BSC) Lower Body Dressing: Dependent Where Assessed-Lower Body Dressing: Other (Comment)(sitting on BSC) Toileting: Maximal assistance Where Assessed-Toileting: Bedside Commode Toilet Transfer: Moderate assistance Toilet Transfer Method: Stand pivot(RW) Toilet Transfer Equipment: Engineer, technical sales Transfer: Not assessed      Therapy/Group: Individual Therapy  Shauntavia Brackin A Maisa Bedingfield 08/01/2019, 4:37 PM

## 2019-08-01 NOTE — Progress Notes (Signed)
Physical Therapy Session Note  Patient Details  Name: Nicholas Hancock MRN: [id number] Date of Birth: 1935-05-18  Today's Date: 08/01/2019 PT Individual Time: 0905-1000 PT Individual Time Calculation (min): 55 min   Short Term Goals: Week 1:  PT Short Term Goal 1 (Week 1): Pt will trasnfer to [facility] with min assist consistently PT Short Term Goal 1 - Progress (Week 1): Met PT Short Term Goal 2 (Week 1): Pt will ambulate 162f with min assist and LRAD PT Short Term Goal 2 - Progress (Week 1): Met PT Short Term Goal 3 (Week 1): Pt will be able to maintian dynamic balance with min assist up to 3 min. PT Short Term Goal 3 - Progress (Week 1): Met PT Short Term Goal 4 (Week 1): pt will perform bed mobility with supervision assist PT Short Term Goal 4 - Progress (Week 1): Progressing toward goal Week 2:  PT Short Term Goal 1 (Week 2): STG=LTG due to ELOS  Skilled Therapeutic Interventions/Progress Updates:   Pt received sitting in WC and agreeable to PT. Pt donned shoes sitting in WC with set up assist from PT for safety. PT instructed in gait training through hall with supervision assist and only intermittent cues for safe RW management [id number] reciprocal movement training x 10 minutes with min cues intermittently for increased ROM in hips and shoulders as well as thoracic rotation.   Standing balance on red wedge to force improved anterior weight shift while engaged in fine motor task of connect 4 with supervision assist from PT and only minimal tactile cues for anterior weight shift needed.   Sitting balance/postural control while use of UE shuttle 2 x 2 min with min cues for increased amplitude of movement. Pt then instructed pt in dynamic gait training with RW to weave through 6 cones x 2 with supervision assist and cues for AD management in turns throughout.   Patient returned to room and [facility] reach and all needs met.         Therapy  Documentation Precautions:  Precautions Precautions: Fall Restrictions Weight Bearing Restrictions: No Pain:   denies    Therapy/Group: Individual Therapy  AustLorie Phenix19/2020, 10:00 AM

## 2019-08-02 ENCOUNTER — Inpatient Hospital Stay (HOSPITAL_COMMUNITY): Payer: Medicare Other | Admitting: Physical Therapy

## 2019-08-02 ENCOUNTER — Inpatient Hospital Stay (HOSPITAL_COMMUNITY): Payer: Medicare Other | Admitting: Occupational Therapy

## 2019-08-02 ENCOUNTER — Inpatient Hospital Stay (HOSPITAL_COMMUNITY): Payer: Medicare Other | Admitting: Speech Pathology

## 2019-08-02 DIAGNOSIS — I10 Essential (primary) hypertension: Secondary | ICD-10-CM

## 2019-08-02 NOTE — Progress Notes (Signed)
Physical Therapy Discharge Summary  Patient Details  Name: JASRAJ LAPPE MRN: [id number] Date of Birth: 1935/01/08  Today's Date: 08/02/2019      Patient has met 8 of 8 long term goals due to improved activity tolerance, improved balance, improved postural control, improved attention, improved awareness and improved coordination.  Patient to discharge at an ambulatory level Supervision.   Patient's care partner is independent to provide the necessary physical assistance at discharge.  Reasons goals not met: N/A all goals met  Recommendation:  Patient will benefit from ongoing skilled PT services in home health setting to continue to advance safe functional mobility, address ongoing impairments in balance, safety, posture, gait, and minimize fall risk.  Equipment: No equipment provided  Reasons for discharge: treatment goals met  Patient/family agrees with progress made and goals achieved: Yes  PT Discharge Precautions/Restrictions Precautions Precautions: Fall Vital Signs Therapy Vitals Pulse Rate: (!) 56 BP: (!) 154/47 Oxygen Therapy SpO2: 100 % Pain Pain Assessment Pain Scale: 0-10 Pain Score: 0-No pain Vision/Perception    WFL Cognition Overall Cognitive Status: Impaired/Different from baseline Arousal/Alertness: Awake/alert Orientation Level: Oriented to person;Oriented to place;Disoriented to time;Oriented to situation Attention: Sustained Sustained Attention: Impaired Sustained Attention Impairment: Verbal basic;Functional basic Memory: Impaired Memory Impairment: Decreased short term memory;Decreased recall of new information Decreased Short Term Memory: Verbal basic;Functional basic Awareness: Impaired Awareness Impairment: Emergent impairment Problem Solving: Impaired Problem Solving Impairment: Verbal basic;Functional basic Executive Function: (all impaired due to lower level deficits) Safety/Judgment: Impaired Sensation Sensation Light Touch:  Appears Intact Coordination Gross Motor Movements are Fluid and Coordinated: No Fine Motor Movements are Fluid and Coordinated: No Coordination and Movement Description: Bradykinesia Finger Nose Finger Test: WNL bilaterally Motor  Motor Motor: Hemiplegia Motor - Discharge Observations: improved R hemi with intermittent decreased intiation  Mobility Bed Mobility Bed Mobility: Rolling Right;Rolling Left;Supine to Sit;Sit to Supine Rolling Right: Supervision/verbal cueing Rolling Left: Supervision/Verbal cueing Supine to Sit: Supervision/Verbal cueing Sit to Supine: Supervision/Verbal cueing Transfers Transfers: Sit to Stand;Stand Pivot Transfers Sit to Stand: Supervision/Verbal cueing Stand to Sit: Supervision/Verbal cueing Transfer (Assistive device): Rolling walker Locomotion  Gait Ambulation: Yes Gait Assistance: Supervision/Verbal cueing Gait Distance (Feet): 200 Feet Assistive device: Rolling walker Gait Assistance Details: cues for safety with RW as tends to keep for forward Gait Gait: Yes Gait Pattern: Impaired Gait Pattern: Shuffle;Trunk flexed Stairs / Additional Locomotion Stairs: Yes Stairs Assistance: Supervision/Verbal cueing Stair Management Technique: Two rails Number of Stairs: 4 Height of Stairs: 6 Ramp: Supervision/Verbal cueing Curb: Supervision/Verbal cueing Wheelchair Mobility Wheelchair Mobility: No  Trunk/Postural Assessment  Cervical Assessment Cervical Assessment: Exceptions to WFL(forward head) Thoracic Assessment Thoracic Assessment: Exceptions to WFL(rounded shoulders) Lumbar Assessment Lumbar Assessment: Exceptions to WFL(posterior pelvic tilt) Postural Control Postural Control: Within Functional Limits  Balance Balance Balance Assessed: Yes Dynamic Sitting Balance Dynamic Sitting - Balance Support: No upper extremity supported Dynamic Sitting - Level of Assistance: 6: Modified independent (Device/Increase time) Dynamic Sitting -  Balance Activities: Ball toss Sitting balance - Comments: improved righting reactions Static Standing Balance Static Standing - Balance Support: No upper extremity supported Static Standing - Level of Assistance: 5: Stand by assistance Dynamic Standing Balance Dynamic Standing - Balance Support: No upper extremity supported Dynamic Standing - Level of Assistance: 5: Stand by assistance Dynamic Standing - Balance Activities: Reaching for objects Dynamic Standing - Comments: playing horseshoes on level tile, intermittent MinA with EC on compliant surface Extremity Assessment  RUE Assessment RUE Assessment: Exceptions to [facility] Active Range of Motion (AROM) Comments:  Full AROM, including shoulder flexion + shoulder abduction General Strength Comments: 3+/5 grossly LUE Assessment LUE Assessment: Exceptions to [facility] Active Range of Motion (AROM) Comments: ~130 degrees shoulder flexion, ~150 degrees shoulder abduction General Strength Comments: 4/5 grossly RLE Assessment RLE Assessment: Within Functional Limits General Strength Comments: grossly 4+/5 LLE Assessment LLE Assessment: Within Functional Limits General Strength Comments: grossly 4+/5    Rosita DeChalus 08/02/2019, 9:59 AM

## 2019-08-02 NOTE — Discharge Summary (Signed)
Occupational Therapy Discharge Summary  Patient Details  Name: Nicholas Hancock MRN: [id number] Date of Birth: 04/30/1935  Today's Date: 08/02/2019 OT Individual Time: 1025-1140 OT Individual Time Calculation (min): 75 min   Patient has met 11 of 11 long term goals due to improved activity tolerance, improved balance, postural control, ability to compensate for deficits and improved coordination.  Patient to discharge at overall Supervision level.  Patient's care partner, [name], has attended hands on family education to provide the necessary assistance at discharge.    All goals met.   Recommendation:  Patient will benefit from ongoing skilled OT services in home health setting (at their [facility]) to continue to advance functional skills in the area of BADL.  Equipment: shower chair  Reasons for discharge: treatment goals met and discharge from hospital  Patient/family agrees with progress made and goals achieved: Yes   Skilled Therapeutic Intervention:  Pt greeted in w/c with no c/o pain. Ready for his shower. Pt completed bathing, dressing, and oral care/grooming tasks with supervision assist. Cues provided for safety throughout (I.e. sitting vs standing when appropriate and using RW for mobility as he forgets to at times). Supervision for ambulatory bathroom transfers using RW with vcs for device mgt. When he was finished with oral care at the sink, pt agreeable to go for a walk. Pt ambulated down hallway and back to room ~370 ft. He required vcs to pathfind his way back to the room due to cognitive deficits, though he was able to state that his room "looked like" the right one. Pt then transferred to the w/c. Spouse arrived at this time. Educated pt and spouse on pts OT goal achievement and process of d/c tomorrow. Both pt and spouse expressed gratefulness for CIR team during his hospital stay. Pt left in w/c with spouse, safety belt fastened.   OT  Discharge Precautions/Restrictions  Precautions Precautions: Fall Vital Signs Therapy Vitals Pulse Rate: (!) 56 BP: (!) 154/47 Oxygen Therapy SpO2: 100 % Pain Pain Assessment Pain Scale: 0-10 Pain Score: 0-No pain ADL ADL Eating: Supervision/safety Where Assessed-Eating: Wheelchair Grooming: Supervision/safety Where Assessed-Grooming: Standing at sink Upper Body Bathing: Supervision/safety Where Assessed-Upper Body Bathing: Shower Lower Body Bathing: Supervision/safety Where Assessed-Lower Body Bathing: Shower Upper Body Dressing: Supervision/safety Where Assessed-Upper Body Dressing: Wheelchair Lower Body Dressing: Supervision/safety Where Assessed-Lower Body Dressing: Wheelchair Toileting: Supervision/safety Where Assessed-Toileting: Glass blower/designer: Close supervision Toilet Transfer Method: Ambulating(RW) Science writer: Raised toilet seat Tub/Shower Transfer: Not assessed Social research officer, government: Close supervision Social research officer, government Method: Ambulating(RW) Youth worker: Radio broadcast assistant Vision Baseline Vision/History: Wears glasses Wears Glasses: At all times Patient Visual Report: Blurring of vision("a little worse" since admission) Praxis Praxis Impairment Details: Initiation;Ideation;Motor planning Cognition Arousal/Alertness: Awake/alert Orientation Level: Oriented to person;Oriented to place;Disoriented to time;Oriented to situation Sustained Attention: Impaired Memory: Impaired Awareness: Impaired Awareness Impairment: Emergent impairment Problem Solving: Impaired Safety/Judgment: Impaired Sensation Coordination Gross Motor Movements are Fluid and Coordinated: No Fine Motor Movements are Fluid and Coordinated: No Coordination and Movement Description: Bradykinesia Finger Nose Finger Test: WNL bilaterally Motor  Motor Motor: Hemiplegia Motor - Discharge Observations: improved R hemi with intermittent decreased  intiation Mobility  Supervision for ambulatory bathroom transfers using RW Trunk/Postural Assessment  Cervical Assessment Cervical Assessment: Exceptions to WFL(forward head) Thoracic Assessment Thoracic Assessment: Exceptions to WFL(rounded shoulders) Lumbar Assessment Lumbar Assessment: Exceptions to WFL(posterior pelvic tilt) Postural Control Postural Control: Within Functional Limits  Balance Balance Balance Assessed: Yes Dynamic Sitting Balance Dynamic Sitting - Balance  Support: No upper extremity supported;During functional activity Dynamic Sitting - Level of Assistance: 5: Stand by assistance(washing feet in the shower) Dynamic Standing Balance Dynamic Standing - Balance Support: Right upper extremity supported;During functional activity Dynamic Standing - Level of Assistance: 5: Stand by assistance Dynamic Standing - Balance Activities: Lateral lean/weight shifting;Forward lean/weight shifting(perihygiene completion in shower) Extremity/Trunk Assessment RUE Assessment RUE Assessment: Exceptions to [facility] Active Range of Motion (AROM) Comments: Full AROM, including shoulder flexion + shoulder abduction General Strength Comments: 3+/5 grossly LUE Assessment LUE Assessment: Exceptions to [facility] Active Range of Motion (AROM) Comments: ~130 degrees shoulder flexion, ~150 degrees shoulder abduction General Strength Comments: 4/5 grossly   Christinia Lambeth A Hiedi [place]ton 08/02/2019, 11:44 AM

## 2019-08-02 NOTE — Plan of Care (Signed)
  Problem: Consults Goal: RH STROKE PATIENT EDUCATION Description: See Patient Education module for education specifics  Outcome: Progressing   Problem: RH BOWEL ELIMINATION Goal: RH STG MANAGE BOWEL WITH ASSISTANCE Description: STG Manage Bowel with min/mod Assistance. Outcome: Progressing   Problem: RH BLADDER ELIMINATION Goal: RH STG MANAGE BLADDER WITH ASSISTANCE Description: STG Manage Bladder With min/mod Assistance Outcome: Progressing   Problem: RH SKIN INTEGRITY Goal: RH STG SKIN FREE OF INFECTION/BREAKDOWN Description: Patients skin will remain free from further breakdown or injury during rehab stay with min/mod assist. Outcome: Progressing Goal: RH STG MAINTAIN SKIN INTEGRITY WITH ASSISTANCE Description: STG Maintain Skin Integrity With min/mod Assistance. Outcome: Progressing   Problem: RH SKIN INTEGRITY Goal: RH STG MAINTAIN SKIN INTEGRITY WITH ASSISTANCE Description: STG Maintain Skin Integrity With min/mod Assistance. Outcome: Progressing   Problem: RH SAFETY Goal: RH STG ADHERE TO SAFETY PRECAUTIONS W/ASSISTANCE/DEVICE Description: STG Adhere to Safety Precautions With supervision/min Assistance/Device. Outcome: Progressing   Problem: RH COGNITION-NURSING Goal: RH STG USES MEMORY AIDS/STRATEGIES W/ASSIST TO PROBLEM SOLVE Description: STG Uses Memory Aids/Strategies With supervision/ min Assistance to Problem Solve. Outcome: Progressing   Problem: RH KNOWLEDGE DEFICIT Goal: RH STG INCREASE KNOWLEDGE OF HYPERTENSION Description: Patient and caregiver will verbalize understanding of HTN including monitoring, medications, diet, exercise, and follow up care with min assist. Outcome: Progressing Goal: RH STG INCREASE KNOWLEGDE OF HYPERLIPIDEMIA Description: Patient and caregiver will verbalize understanding of HLD including monitoring, medications, diet, exercise, and follow up care with min assist. Outcome: Progressing Goal: RH STG INCREASE KNOWLEDGE OF STROKE  PROPHYLAXIS Description: Patient and caregiver will verbalize understanding of stroke prophylaxis including monitoring, medications, diet, exercise, and follow up care with min assist. Outcome: Progressing

## 2019-08-02 NOTE — Progress Notes (Signed)
Physical Therapy Session Note  Patient Details  Name: Nicholas Hancock MRN: [id number] Date of Birth: May 02, 1935  Today's Date: 08/02/2019 PT Individual Time: 0905-1000 PT Individual Time Calculation (min): 55 min   Short Term Goals: Week 2:  PT Short Term Goal 1 (Week 2): STG=LTG due to ELOS  Skilled Therapeutic Interventions/Progress Updates: Pt presented in w/c agreeable to therapy. Pt denies pain during session. Session focused on functional activities got d/c including car transfer, gait, balance on Airex, horseshoes, stairs, and bed mobility. All activities performed at [facility] to supervision level. Pt did require mod verbal cues and minA when performing standing balance on Airex eyes closed due to increased R lean.  Pt required intermittent verbal cues for safety with RW during ambulation however pt demonstrated fair safety overall during session. Pt completed session on NuStep L3 x 6 min for general conditioning. Pt transported back to room at end of session and left with belt alarm on, call bell within reach and needs met.      Therapy Documentation Precautions:  Precautions Precautions: Fall Restrictions Weight Bearing Restrictions: No General:   Vital Signs: Therapy Vitals Temp: 97.8 F (36.6 C) Temp Source: Oral Pulse Rate: (!) 56 Resp: 18 BP: (!) 154/47 Patient Position (if appropriate): Lying Oxygen Therapy SpO2: 100 % O2 Device: Room Air Pain: Pain Assessment Pain Scale: 0-10 Pain Score: 0-No pain Mobility: Bed Mobility Bed Mobility: Rolling Right;Rolling Left;Supine to Sit;Sit to Supine Rolling Right: Supervision/verbal cueing Rolling Left: Supervision/Verbal cueing Supine to Sit: Supervision/Verbal cueing Sit to Supine: Supervision/Verbal cueing Transfers Transfers: Sit to Stand;Stand Pivot Transfers Sit to Stand: Supervision/Verbal cueing Stand to Sit: Supervision/Verbal cueing Transfer (Assistive device): Rolling walker Locomotion :    Trunk/Postural  Assessment : Cervical Assessment Cervical Assessment: Exceptions to WFL(forward head) Thoracic Assessment Thoracic Assessment: Exceptions to WFL(rounded shoulders) Lumbar Assessment Lumbar Assessment: Exceptions to WFL(posterior pelvic tilt) Postural Control Postural Control: Within Functional Limits  Balance: Balance Balance Assessed: Yes Dynamic Sitting Balance Dynamic Sitting - Balance Support: No upper extremity supported Dynamic Sitting - Level of Assistance: 6: Modified independent (Device/Increase time) Dynamic Sitting - Balance Activities: Ball toss Sitting balance - Comments: improved righting reactions Static Standing Balance Static Standing - Balance Support: No upper extremity supported Static Standing - Level of Assistance: 5: Stand by assistance Dynamic Standing Balance Dynamic Standing - Balance Support: No upper extremity supported Dynamic Standing - Level of Assistance: 5: Stand by assistance Dynamic Standing - Balance Activities: Reaching for objects Dynamic Standing - Comments: playing horseshoes on level tile, intermittent MinA with EC on compliant surface Exercises:   Other Treatments:      Therapy/Group: Individual Therapy  Tyrian Peart 08/02/2019, 9:42 AM

## 2019-08-02 NOTE — Discharge Summary (Signed)
Physician Discharge Summary  Patient ID: Nicholas Hancock MRN: [id number] DOB/AGE: Mar 27, 1935 84 y.o.  Admit date: 07/17/2019 Discharge date: 08/03/2019  Discharge Diagnoses:  Principal Problem:   Left basal ganglia embolic stroke [facility](HCC) Active Problems:   Hypokalemia   Acute systolic heart failure (HCC)   Impulsive   Decreased GFR   Benign essential HTN DVT prophylaxis Hypertension Hyperlipidemia Remote tobacco abuse Enterococcus UTI  Discharged Condition: Stable  Significant Diagnostic Studies: DG Chest 2 View  Result Date: 07/12/2019 CLINICAL DATA:  Altered mental status EXAM: CHEST - 2 VIEW COMPARISON:  None. FINDINGS: Heart is borderline in size. Lungs clear. No effusions. No acute bony abnormality. IMPRESSION: No active cardiopulmonary disease. Electronically Signed   By: [name]  Dover M.D.   On: 07/12/2019 19:52   CT HEAD WO CONTRAST  Result Date: 07/12/2019 CLINICAL DATA:  Patient with difficulty finding words. EXAM: CT HEAD WITHOUT CONTRAST TECHNIQUE: Contiguous axial images were obtained from the base of the skull through the vertex without intravenous contrast. COMPARISON:  MRI brain 01/17/2018 FINDINGS: Brain: Ventricles and sulci are prominent compatible with atrophy. Extensive periventricular and subcortical white matter hypodensities compatible with chronic microvascular ischemic changes. No evidence for acute cortically based infarct, intracranial hemorrhage, mass lesion or mass-effect. Bilateral chronic basal ganglia lacunar infarcts. Vascular: Unremarkable Skull: Intact. Sinuses/Orbits: Polypoid mucosal thickening within the maxillary sinuses bilaterally. Mucosal thickening involving the ethmoid air cells. Mastoid air cells are unremarkable. Orbits are unremarkable. Other: None. IMPRESSION: No acute intracranial process. Atrophy and chronic microvascular ischemic changes. Electronically Signed   By: Annia Beltrew  Davis M.D.   On: 07/12/2019 18:07   MR ANGIO HEAD WO  CONTRAST  Result Date: 07/13/2019 CLINICAL DATA:  84 year old male with abnormal speech since yesterday. Brain MRI positive for acute left corona radiata and several additional acute to subacute bilateral cerebral infarcts with numerous chronic microhemorrhages and intracranial artery ectasia. EXAM: MRA HEAD WITHOUT CONTRAST TECHNIQUE: Angiographic images of the Circle of Willis were obtained using MRA technique without intravenous contrast. COMPARISON:  Brain MRI today reported separately. FINDINGS: Antegrade flow in the posterior circulation with dominant right vertebral artery. The left vertebral artery functionally terminates in PICA. The right PICA origin is also patent. Mild distal vertebral irregularity without stenosis. Mild to moderate irregularity and stenosis of the basilar artery, most pronounced in the proximal basilar as seen on series 9, image 64 and series 1062, image 17. The basilar artery remains patent. Ectatic basilar tip with no discrete aneurysm. Fetal type left PCA origin. Right posterior communicating artery is diminutive or absent. Patent SCA origins. No significant right PCA stenosis. There is a severe left PCA P3 segment stenosis with preserved distal flow. Mild superimposed multifocal left P2 segment irregularity and stenosis. Antegrade flow in both ICA siphons. Tortuous distal cervical ICAs. Bilateral siphon irregularity with mild stenosis greater on the left. Normal ophthalmic and left posterior communicating artery origins. Dominant right and diminutive or absent left ACA A1 segments. Ectatic right ICA terminus. Normal MCA and right ACA origins. Anterior communicating artery and tortuous bilateral ACA branches are within normal limits. Right MCA M1 segment and bifurcation are tortuous without stenosis. There is mild right MCA M3 branch irregularity. Left MCA M1 segment is tortuous and irregular with mild stenosis. The left MCA trifurcation is patent without stenosis. Visible left  MCA branches are within normal limits. IMPRESSION: 1. 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 Stenoses are most pronounced in the Basilar Artery (Moderate) and Left PCA P3 segment (Severe). Mild stenosis elsewhere. No  large vessel or intracranial branch occlusion identified. 2. Dominant right vertebral artery and right ACA A1 segment. Electronically Signed   By: Odessa Fleming M.D.   On: 07/13/2019 02:06   MR BRAIN WO CONTRAST  Result Date: 07/13/2019 CLINICAL DATA:  84 year old male with abnormal speech since yesterday. EXAM: MRI HEAD WITHOUT CONTRAST TECHNIQUE: Multiplanar, multiecho pulse sequences of the brain and surrounding structures were obtained without intravenous contrast. COMPARISON:  Plain head CT 07/12/2019. FINDINGS: Brain: Linear 10 millimeter area of restricted diffusion tracking from the left corona radiata to the posterior lentiform (series 7, image 55). There is also a small area of restricted diffusion in the left occipital lobe periventricular white matter on series 5, image 67. There is also a small linear area of contralateral right occipital lobe restricted diffusion on series 5, image 65 (and series 7, image 44). Furthermore there are one or two punctate, subtle areas of abnormal white matter diffusion in the right frontal lobe (such as on series 5, image 77) which are not definitely restricted on ADC. Superimposed chronic lacunar infarcts in the bilateral cerebral white matter, bilateral deep gray nuclei, and occasionally in the cerebellum. Superimposed numerous scattered chronic microhemorrhages in the brain, mostly sparing the deep gray nuclei. No definite chronic cortical encephalomalacia. No midline shift, mass effect, evidence of mass lesion, ventriculomegaly, extra-axial collection or acute intracranial hemorrhage. Cervicomedullary junction and pituitary are within normal limits. Vascular: Major intracranial vascular flow voids are preserved. There is  generalized intracranial artery ectasia. The right vertebral artery appears dominant. See also intracranial MRA reported separately. Skull and upper cervical spine: Negative for age visible cervical spine. Visualized bone marrow signal is within normal limits. Sinuses/Orbits: Negative orbits. Multiple maxillary sinus mucous retention cysts. Other: Mastoids are well pneumatized. Visible internal auditory structures appear normal. Scalp and face soft tissues appear negative. IMPRESSION: 1. Small acute lacunar infarct in the left corona radiata with no associated hemorrhage or mass effect. But several additional small acute to subacute infarcts in the bilateral occipital lobes and right frontal lobe. Favor synchronous small vessel disease (see #2, #3) over a recent embolic event. 2. Underlying advanced chronic small vessel ischemic disease. 3. Superimposed numerous chronic micro-hemorrhages in the brain, Amyloid Angiopathy is possible. 4. Generalized intracranial artery ectasia. See also intracranial MRA reported separately. Electronically Signed   By: Odessa Fleming M.D.   On: 07/13/2019 01:59   VAS US CAROTID  Result Date: 07/14/2019 Carotid Arterial Duplex Study Indications:       TIA. Risk Factors:      Hypertension. Comparison Study:  no prior Performing Technologist: [name] RVS  Examination Guidelines: A complete evaluation includes B-mode imaging, spectral Doppler, color Doppler, and power Doppler as needed of all accessible portions of each vessel. Bilateral testing is considered an integral part of a complete examination. Limited examinations for reoccurring indications may be performed as noted.  Right Carotid Findings: +----------+--------+--------+--------+------------------+--------+           PSV cm/sEDV cm/sStenosisPlaque DescriptionComments +----------+--------+--------+--------+------------------+--------+ CCA Prox  77      4               heterogenous                +----------+--------+--------+--------+------------------+--------+ CCA Distal80      8               heterogenous               +----------+--------+--------+--------+------------------+--------+ ICA Prox  87  10      1-39%   heterogenous               +----------+--------+--------+--------+------------------+--------+ ICA Distal152     12                                         +----------+--------+--------+--------+------------------+--------+ ECA       174                                                +----------+--------+--------+--------+------------------+--------+ +----------+--------+-------+--------+-------------------+           PSV cm/sEDV cmsDescribeArm Pressure (mmHG) +----------+--------+-------+--------+-------------------+ Subclavian150                                        +----------+--------+-------+--------+-------------------+ +---------+--------+--+--------+-+---------+ VertebralPSV cm/s53EDV cm/s4Antegrade +---------+--------+--+--------+-+---------+  Left Carotid Findings: +----------+--------+--------+--------+------------------+--------+           PSV cm/sEDV cm/sStenosisPlaque DescriptionComments +----------+--------+--------+--------+------------------+--------+ CCA Prox  106                     heterogenous               +----------+--------+--------+--------+------------------+--------+ CCA Distal69                      heterogenous               +----------+--------+--------+--------+------------------+--------+ ICA Prox  108     18      1-39%   heterogenous               +----------+--------+--------+--------+------------------+--------+ ICA Distal62      8                                          +----------+--------+--------+--------+------------------+--------+ ECA       138                                                +----------+--------+--------+--------+------------------+--------+  +----------+--------+--------+--------+-------------------+           PSV cm/sEDV cm/sDescribeArm Pressure (mmHG) +----------+--------+--------+--------+-------------------+ Subclavian120                                         +----------+--------+--------+--------+-------------------+ +---------+--------+--+--------+---------+ VertebralPSV cm/s49EDV cm/sAntegrade +---------+--------+--+--------+---------+  Summary: Right Carotid: Velocities in the right ICA are consistent with a 1-39% stenosis. Left Carotid: Velocities in the left ICA are consistent with a 1-39% stenosis. Vertebrals: Bilateral vertebral arteries demonstrate antegrade flow. *See table(s) above for measurements and observations.  Electronically signed by Antony Contras MD on 07/14/2019 at 8:12:32 AM.    Final    ECHOCARDIOGRAM LIMITED  Result Date: 07/13/2019   ECHOCARDIOGRAM LIMITED REPORT   Patient Name:   DERRIK MCEACHERN [facility] Date of Exam: 07/13/2019 Medical Rec #:  [id number]       Height:       68.0 in  Accession #:    [id number]      Weight:       160.0 lb Date of Birth:  06/05/1935       BSA:          1.86 m Patient Age:    84 years        BP:           210/87 mmHg Patient Gender: M               HR:           91 bpm. Exam Location:  Inpatient  Procedure: Limited Echo, Limited Color Doppler and Cardiac Doppler Indications:    TIA 435.9  History:        Patient has prior history of Echocardiogram examinations, most                 recent 05/25/2019.  Sonographer:    Delcie Roch Referring Phys: [phone] JAMES KIM IMPRESSIONS  1. Left ventricular ejection fraction, by visual estimation, is 35 to 40%. The left ventricle has moderately decreased function. Mildly increased left ventricular posterior wall thickness. There is no left ventricular hypertrophy.  2. Mildly dilated left ventricular internal cavity size.  3. Compared to report from Oct 2020, LVEF is depressed.  4. Global right ventricle has normal systolic function.The right  ventricular size is normal. No increase in right ventricular wall thickness.  5. Left atrial size was moderately dilated.  6. Right atrial size was normal.  7. Trivial pericardial effusion is present.  8. The mitral valve is grossly normal. Mild mitral valve regurgitation.  9. The tricuspid valve is normal in structure. Tricuspid valve regurgitation mild-moderate. 10. The aortic valve is tricuspid. Aortic valve regurgitation is mild. Mild aortic valve sclerosis without stenosis. 11. The pulmonic valve was grossly normal. Pulmonic valve regurgitation is trivial. 12. Normal pulmonary artery systolic pressure. 13. The interatrial septum was not assessed. FINDINGS  Left Ventricle: Left ventricular ejection fraction, by visual estimation, is 35 to 40%. The left ventricle has moderately decreased function. The left ventricular internal cavity size was mildly dilated left ventricle. Mildly increased left ventricular posterior wall thickness. There is no left ventricular hypertrophy. Compared to report from Oct 2020, LVEF is depressed. Right Ventricle: The right ventricular size is normal. No increase in right ventricular wall thickness. Global RV systolic function is has normal systolic function. The tricuspid regurgitant velocity is 2.40 m/s, and with an assumed right atrial pressure  of 3 mmHg, the estimated right ventricular systolic pressure is normal at 26.0 mmHg. Left Atrium: Left atrial size was moderately dilated. Right Atrium: Right atrial size was normal in size Pericardium: Trivial pericardial effusion is present. Mitral Valve: The mitral valve is grossly normal. Mild mitral valve regurgitation. Tricuspid Valve: The tricuspid valve is normal in structure. Tricuspid valve regurgitation mild-moderate. Aortic Valve: The aortic valve is tricuspid. Aortic valve regurgitation is mild. Mild aortic valve sclerosis is present, with no evidence of aortic valve stenosis. Pulmonic Valve: The pulmonic valve was grossly  normal. Pulmonic valve regurgitation is trivial. Aorta: The aortic root is normal in size and structure. Shunts: The interatrial septum was not assessed.  LEFT VENTRICLE          Normals PLAX 2D LVIDd:         5.30 cm  3.6 cm LVIDs:         3.90 cm  1.7 cm LV PW:         1.30 cm  1.4 cm LV IVS:        1.10 cm  1.3 cm LVOT diam:     2.20 cm  2.0 cm LV SV:         69 ml    79 ml LV SV Index:   37.07    45 ml/m2 LVOT Area:     3.80 cm 3.14 cm2  LEFT ATRIUM         Index      RIGHT ATRIUM           Index LA diam:    4.70 cm 2.53 cm/m RA Area:     14.40 cm                                RA Volume:   29.10 ml  15.65 ml/m   AORTA                 Normals Ao Root diam: 3.40 cm 31 mm TRICUSPID VALVE             Normals TR Peak grad:   23.0 mmHg TR Vmax:        298.00 cm/s 288 cm/s  SHUNTS Systemic Diam: 2.20 cm  Dietrich Pates MD Electronically signed by Dietrich Pates MD Signature Date/Time: 07/13/2019/12:47:51 PM    Final     Labs:  Basic Metabolic Panel: Recent Labs  Lab 07/27/19 1029 07/31/19 0618  NA 141 142  K 3.7 3.2*  CL 103 104  CO2 29 28  GLUCOSE 188* 96  BUN 21 16  CREATININE 1.34* 1.23  CALCIUM 8.9 8.4*    CBC: No results for input(s): WBC, NEUTROABS, HGB, HCT, MCV, PLT in the last 168 hours.  CBG: No results for input(s): GLUCAP in the last 168 hours.  Family history.  Father with CVA and diabetes mellitus as well as cancer unknown.  Mother with CAD.  Denies any hypertension.  Brief HPI:   Nicholas Hancock is a 84 y.o. right-handed male with history of hypertension, adrenal gland dysfunction, prior history of TIA/CVA maintained on aspirin followed by neurology services Dr.Jaffee, remote tobacco abuse.  Per chart review lives with spouse independent with assistive device prior to admission.  Lives with wife independent living facility [facility].  Presented 07/12/2019 with aphasia and leaning to the right.  Cranial CT scan negative.  Patient did not receive TPA.  MRI of the brain showed  small acute lacunar infarction in the left corona radiata with no associated hemorrhage or mass-effect.  Several small additional acute to subacute infarctions of the bilateral occipital lobes and right frontal lobe.  MRA with no large vessel occlusion.  Echocardiogram with ejection fraction of 40% without emboli.  Admission chemistries unremarkable, urine drug screen negative, SARS coronavirus negative.  Neurology follow-up maintained on aspirin for CVA prophylaxis.  Neurology did not feel patient was a candidate for DAPT given concern for possible CAA.  Tolerating regular diet.  Cardiology service did follow-up for review of low ejection fraction identified on echocardiogram.  Telemetry reviewed showed short burst of atrial tachycardia not consistent with atrial fibrillation or flutter.  Recommendations were to start low-dose Toprol/Aldactone as well as continue low-dose aspirin.  Patient was admitted for a comprehensive rehab program   Hospital Course: ETHEL MARA was admitted to rehab 07/17/2019 for inpatient therapies to consist of PT, ST and OT at least three hours five days a week. Past admission  physiatrist, therapy team and rehab RN have worked together to provide customized collaborative inpatient rehab.  Pertaining to patient's small acute left basal ganglia corona radiata infarct as well as multiple small subacute bilateral occipital and right frontal lobe infarct remained stable maintained on low-dose aspirin therapy.  [facility] of [facility]IA/CVA 2019.  SCDs for DVT prophylaxis.  Blood pressures controlled on Cozaar, Toprol as well as low-dose Aldactone and follow-up recommended with PCP.  Hyperlipidemia with Lipitor ongoing.  Hospital rehab course Enterococcus UTI sensitive to ampicillin 7-day course completed patient remained afebrile.  AKI versus CKD with latest creatinine 1.23.   Blood pressures were monitored on TID basis and controlled  He/ is continent of  bowel and bladder.  He/ has made gains during rehab stay and is attending therapies  He/ will continue to receive follow up therapies   after discharge  Rehab course: During patient's stay in rehab weekly team conferences were held to monitor patient's progress, set goals and discuss barriers to discharge. At admission, patient required moderate assist sit to stand, mod max assist ambulate 30 feet rolling walker, minimal assist supine to sit minimal assist sit to supine.  Minimal assist upper body bathing, moderate assist for cues upper body bathing, moderate assist lower body bathing, minimal guard upper body dressing, moderate assist toilet transfers  Physical exam.  Blood pressure 146/62 pulse 67 temperature 97.3 respirations 19 oxygen saturations 97% room air Constitutional.  No distress normal appearing HEENT.  Oral mucosa pink and moist no exudate Neck.  Supple nontender no JVD without thyromegaly Cardiac.  Regular rate rhythm without any extra sounds murmur or heard Respiratory.  Effort normal no respiratory distress without wheezes Abdomen.  Soft nontender positive bowel sounds without rebound Extremities.  No edema palpable pedal pulses Neurological.  Lethargic but arousable speech was a bit dysarthric but intelligible he made good eye contact with examiner.  Follows simple commands.  He was able to tell me he was in the hospital as well as his name.  He does display limited insight and awareness of his deficits.  4+ out of 5 strength throughout made good effort during examination.    He/She  has had improvement in activity tolerance, balance, postural control as well as ability to compensate for deficits. He/She has had improvement in functional use RUE/LUE  and RLE/LLE as well as improvement in awareness.  Working with energy conservation techniques.  Patient donned shoes sitting in wheelchair with set up assist.  Instructed in gait training throughout the hallway with supervision  assist.  Ambulating up to 200 feet rolling walker.  Standing balance on red wedge to force improved anterior weight shift.  He ambulates to the sink for his ADLs with supervision using rolling walker.  Cued patient to return to wheelchair afterwards proceeded to walk into the bathroom.  Needed some assistance for lower body ADLs.  Full family teaching completed plan discharge to home       Disposition: Discharge disposition: 01-Home or Self Care     Discharged home   Diet: Regular  Special Instructions: No driving smoking or alcohol  Medications at discharge 1.  Tylenol as needed 2.  Aspirin 81 mg p.o. daily 3.  Lipitor 80 mg p.o. daily 4.  Vitamin D 1000 units p.o. every other day 5.  Cozaar 50 mg p.o. daily 6.  Toprol-XL 50 mg p.o. daily 7.  Aldactone 50 mg p.o. daily 8.  Trazodone 50 mg p.o. nightly as needed sleep  9.  Vitamin B12 1000 mcg p.o. daily  Discharge Instructions    Ambulatory referral to Physical Medicine Rehab   Complete by: As directed    Moderate complexity follow up 1-2 weeks left basal ganglia infarction      Follow-up Information    Kirsteins, Victorino Sparrow, MD Follow up.   Specialty: Physical Medicine and Rehabilitation Why: Office to call for appointment Contact information: [address] [place] [address] [phone]        [name], DO Follow up.   Specialty: Neurology Why: Call for appointment Contact information: [address] [place] [address] [phone]        Manson Passey, PA Follow up.   Specialty: Cardiology Why: Call for appointment Contact information: [address] [place] [address] [phone]           Signed: Charlton Amor 08/03/2019, 5:08 AM

## 2019-08-02 NOTE — Progress Notes (Signed)
Speech Language Pathology Discharge Summary  Patient Details  Name: Nicholas Hancock MRN: [id number] Date of Birth: 1935-02-20  Today's Date: 08/02/2019 SLP Individual Time: 1300-1346 SLP Individual Time Calculation (min): 46 min   Skilled Therapeutic Interventions:  Pt was seen for skilled ST targeting cognition. Pt oriented to place and situation with Supervision A for use of external aids. Pt required Mod A question cues to recall specific activities from therapy earlier in day. Mod A verbal and visual cues also provided for problem solving and awareness of 1 error during a familiar 3-step action card sequencing task. Pt left sitting in chair with seatbelt alarm in place, wife present. Continue per current plan of care.       Patient has met 7 of 7 long term goals.  Patient to discharge at overall Mod;Min level.  Reasons goals not met: n/a   Clinical Impression/Discharge Summary:   Pt made slow but functional gains and met 7 out of 7 long term goals this admission. Pt currently requires Min-Mod assist for basic cognitive tasks and will require 24/7 supervision. Pt is consuming regular diet with thin liquids and is Supervision A for use of swallow precautions and limiting verbosity during PO intake (that occasionally leads to coughing). Pt has demonstrated improved sustained attention, problem solving in basic familiar situations, and consistent orientation to place and situation. He has also demonstrated improved speech intelligibility (~90% conversation level) through use of increased vocal intensity and overarticulation strategies. However, given cognitive deficits impacting problem solving, attention, emergent awareness, and short term memory still present, recommend pt continue to receive skilled [place] services upon discharge. Pt and family education is complete at this time.   Care Partner:  Caregiver Able to Provide Assistance: Yes  Type of Caregiver Assistance:  Cognitive  Recommendation:  Home Health SLP;24 hour supervision/assistance  Rationale for SLP Follow Up: Maximize functional communication;Maximize swallowing safety;Maximize cognitive function and independence;Reduce caregiver burden   Equipment: none   Reasons for discharge: Discharged from hospital   Patient/Family Agrees with Progress Made and Goals Achieved: Yes    Arbutus Leas 08/02/2019, 7:24 AM

## 2019-08-02 NOTE — Plan of Care (Signed)
  Problem: RH Balance Goal: LTG: Patient will maintain dynamic sitting balance (OT) Description: LTG:  Patient will maintain dynamic sitting balance with assistance during activities of daily living (OT) Outcome: Completed/Met Goal: LTG Patient will maintain dynamic standing with ADLs (OT) Description: LTG:  Patient will maintain dynamic standing balance with assist during activities of daily living (OT)  Outcome: Completed/Met   Problem: Sit to Stand Goal: LTG:  Patient will perform sit to stand in prep for activites of daily living with assistance level (OT) Description: LTG:  Patient will perform sit to stand in prep for activites of daily living with assistance level (OT) Outcome: Completed/Met   Problem: RH Eating Goal: LTG Patient will perform eating w/assist, cues/equip (OT) Description: LTG: Patient will perform eating with assist, with/without cues using equipment (OT) Outcome: Completed/Met   Problem: RH Grooming Goal: LTG Patient will perform grooming w/assist,cues/equip (OT) Description: LTG: Patient will perform grooming with assist, with/without cues using equipment (OT) Outcome: Completed/Met   Problem: RH Bathing Goal: LTG Patient will bathe all body parts with assist levels (OT) Description: LTG: Patient will bathe all body parts with assist levels (OT) Outcome: Completed/Met   Problem: RH Dressing Goal: LTG Patient will perform upper body dressing (OT) Description: LTG Patient will perform upper body dressing with assist, with/without cues (OT). Outcome: Completed/Met Goal: LTG Patient will perform lower body dressing w/assist (OT) Description: LTG: Patient will perform lower body dressing with assist, with/without cues in positioning using equipment (OT) Outcome: Completed/Met   Problem: RH Toileting Goal: LTG Patient will perform toileting task (3/3 steps) with assistance level (OT) Description: LTG: Patient will perform toileting task (3/3 steps) with  assistance level (OT)  Outcome: Completed/Met   Problem: RH Toilet Transfers Goal: LTG Patient will perform toilet transfers w/assist (OT) Description: LTG: Patient will perform toilet transfers with assist, with/without cues using equipment (OT) Outcome: Completed/Met   Problem: RH Tub/Shower Transfers Goal: LTG Patient will perform tub/shower transfers w/assist (OT) Description: LTG: Patient will perform tub/shower transfers with assist, with/without cues using equipment (OT) Outcome: Completed/Met

## 2019-08-02 NOTE — Progress Notes (Signed)
[facility] PHYSICAL MEDICINE & REHABILITATION PROGRESS NOTE   Subjective/Complaints: Patient seen laying in bed this morning.  He states he slept well overnight.  No reported issues overnight.  ROS: Appears to deny CP, shortness of breath, nausea, vomiting, diarrhea.  Objective:   No results found. No results for input(s): WBC, HGB, HCT, PLT in the last 72 hours. Recent Labs    07/31/19 0618  NA 142  K 3.2*  CL 104  CO2 28  GLUCOSE 96  BUN 16  CREATININE 1.23  CALCIUM 8.4*    Intake/Output Summary (Last 24 hours) at 08/02/2019 1254 Last data filed at 08/02/2019 0730 Gross per 24 hour  Intake 480 ml  Output 725 ml  Net -245 ml     Physical Exam: Vital Signs Blood pressure (!) 154/47, pulse (!) 56, temperature 97.8 F (36.6 C), temperature source Oral, resp. rate 18, SpO2 100 %. Constitutional: No distress . Vital signs reviewed. HENT: Normocephalic.  Atraumatic. Eyes: EOMI. No discharge. Cardiovascular: No JVD. Respiratory: Normal effort.  No stridor. GI: Non-distended. Skin: Warm and dry.  Intact. Psych: Confused. Musc: No edema in extremities.  No tenderness in extremities. Neuro: Alert and oriented x1 Follows limited commands, but appears to be 5/5 throughout  Assessment/Plan: 1. Functional deficits secondary to bilateral cortical and subcortical infarcts  which require 3+ hours per day of interdisciplinary therapy in a comprehensive inpatient rehab setting.  Physiatrist is providing close team supervision and 24 hour management of active medical problems listed below.  Physiatrist and rehab team continue to assess barriers to discharge/monitor patient progress toward functional and medical goals  Care Tool:  Bathing    Body parts bathed by patient: Right arm, Left arm, Chest, Abdomen, Front perineal area, Buttocks, Right upper leg, Left upper leg, Face, Right lower leg, Left lower leg   Body parts bathed by helper: Right lower leg, Buttocks Body parts  n/a: Right lower leg, Left lower leg   Bathing assist Assist Level: Supervision/Verbal cueing     Upper Body Dressing/Undressing Upper body dressing   What is the patient wearing?: Pull over shirt    Upper body assist Assist Level: Supervision/Verbal cueing    Lower Body Dressing/Undressing Lower body dressing      What is the patient wearing?: Pants, Incontinence brief     Lower body assist Assist for lower body dressing: Supervision/Verbal cueing     Toileting Toileting Toileting Activity did not occur (Clothing management and hygiene only): Refused  Toileting assist Assist for toileting: Supervision/Verbal cueing     Transfers Chair/bed transfer  Transfers assist     Chair/bed transfer assist level: Supervision/Verbal cueing     Locomotion Ambulation   Ambulation assist      Assist level: Supervision/Verbal cueing Assistive device: Walker-rolling Max distance: 200   Walk 10 feet activity   Assist     Assist level: Supervision/Verbal cueing Assistive device: Walker-rolling   Walk 50 feet activity   Assist Walk 50 feet with 2 turns activity did not occur: Safety/medical concerns  Assist level: Supervision/Verbal cueing Assistive device: Walker-rolling    Walk 150 feet activity   Assist Walk 150 feet activity did not occur: Safety/medical concerns  Assist level: Supervision/Verbal cueing Assistive device: Walker-rolling    Walk 10 feet on uneven surface  activity   Assist Walk 10 feet on uneven surfaces activity did not occur: Safety/medical concerns   Assist level: Supervision/Verbal cueing Assistive device: Aeronautical engineer Will patient use wheelchair  at discharge?: No Type of Wheelchair: Manual    Wheelchair assist level: Minimal Assistance - Patient > 75% Max wheelchair distance: 100    Wheelchair 50 feet with 2 turns activity    Assist        Assist Level: Minimal Assistance -  Patient > 75%   Wheelchair 150 feet activity     Assist  Wheelchair 150 feet activity did not occur: Safety/medical concerns       Blood pressure (!) 154/47, pulse (!) 56, temperature 97.8 F (36.6 C), temperature source Oral, resp. rate 18, SpO2 100 %.     Medical Problem List and Plan: 1.  Mild aphasia with decreased functional mobility secondary to small acute left basal ganglia corona radiata and multiple small subacute bilateral occipital and right frontal lobe infarcts ( ?embolic)  as well as history of TIA/CVA 2019 followed by Dr.Jaffe            Continue CIR 2.  Antithrombotics: -DVT/anticoagulation: SCDs             -antiplatelet therapy: Aspirin 81 mg daily 3. Pain Management: Tylenol as needed 4. Mood: Provide emotional support             -antipsychotic agents: N/A 5. Neuropsych: This patient is not capable of making decisions on his own behalf.  Telesitter for safety due to impulsivity 6. Skin/Wound Care: Routine skin checks 7. Fluids/Electrolytes/Nutrition: encourage PO 8.  Hypertension.  Monitor with increased mobility Vitals:   08/02/19 0549 08/02/19 0821  BP: (!) 166/61 (!) 154/47  Pulse: (!) 52 (!) 56  Resp: 18   Temp: 97.8 F (36.6 C)   SpO2: 98% 100%   Aldactone, cozaar and metoprolol  Cozaar increased to 50 mg on 12/18  Remains elevated, but?  Improving on 12/20.  Consider further increase medication tomorrow if persistently elevated. 9.  Acute systolic heart failure.compensated   Follow-up per cardiology services.  Continue Aldactone as well as beta-blocker.  Echo showing EF of 35-40% which was decreased from Echo 1 month prior with EF 55-60%.  Daily weights ordered, pending There were no vitals filed for this visit.   10.  Hyperlipidemia.  Lipitor 11.  Remote tobacco abuse.  Counseling 12.  Enterococcus UTI S to amp 7d course of amox completed   13. Hypokalemia  Potassium 3.2 on 12/18, labs ordered for tomorrow  Supplement initiated 14.  AKI  versus CKD  GFR 54 on 12/18, labs ordered for tomorrow  LOS: 16 days A FACE TO FACE EVALUATION WAS PERFORMED  Ankit Karis Juba 08/02/2019, 12:54 PM

## 2019-08-03 ENCOUNTER — Other Ambulatory Visit: Payer: Self-pay | Admitting: Cardiology

## 2019-08-03 LAB — BASIC METABOLIC PANEL
Anion gap: 8 (ref 5–15)
BUN: 18 mg/dL (ref 8–23)
CO2: 27 mmol/L (ref 22–32)
Calcium: 8.6 mg/dL — ABNORMAL LOW (ref 8.9–10.3)
Chloride: 106 mmol/L (ref 98–111)
Creatinine, Ser: 1.28 mg/dL — ABNORMAL HIGH (ref 0.61–1.24)
GFR calc Af Amer: >60 mL/min (ref >60)
GFR calc non Af Amer: 52 mL/min — ABNORMAL LOW (ref >60)
Glucose, Bld: 169 mg/dL — ABNORMAL HIGH (ref 70–99)
Potassium: 3.4 mmol/L — ABNORMAL LOW (ref 3.5–5.1)
Sodium: 141 mmol/L (ref 135–145)

## 2019-08-03 MED ORDER — VITAMIN D-3 125 MCG (5000 UT) PO TABS: 1.0000 | ORAL_TABLET | ORAL | 1 refills | Status: AC

## 2019-08-03 MED ORDER — METOPROLOL SUCCINATE ER 50 MG PO TB24: 50.0000 mg | ORAL_TABLET | Freq: Every day | ORAL | 0 refills | Status: DC

## 2019-08-03 MED ORDER — SPIRONOLACTONE 50 MG PO TABS: 50.0000 mg | ORAL_TABLET | Freq: Every day | ORAL | 1 refills | Status: DC

## 2019-08-03 MED ORDER — CALCIUM 167 MG PO CAPS: 1.0000 | ORAL_CAPSULE | Freq: Every day | ORAL | 0 refills | Status: DC

## 2019-08-03 MED ORDER — LOSARTAN POTASSIUM 50 MG PO TABS: 50.0000 mg | ORAL_TABLET | Freq: Every day | ORAL | 1 refills | Status: DC

## 2019-08-03 MED ORDER — ATORVASTATIN CALCIUM 80 MG PO TABS: 80.0000 mg | ORAL_TABLET | Freq: Every day | ORAL | 5 refills | Status: AC

## 2019-08-03 MED ORDER — ACETAMINOPHEN 325 MG PO TABS: 650.0000 mg | ORAL_TABLET | ORAL | Status: DC | PRN

## 2019-08-03 NOTE — Plan of Care (Signed)
  Problem: Consults Goal: RH STROKE PATIENT EDUCATION Description: See Patient Education module for education specifics  08/03/2019 1139 by Amanda Cockayne, LPN Outcome: Completed/Met 08/03/2019 7943 by Amanda Cockayne, LPN Outcome: Progressing   Problem: RH BOWEL ELIMINATION Goal: RH STG MANAGE BOWEL WITH ASSISTANCE Description: STG Manage Bowel with min/mod Assistance. 08/03/2019 1139 by Toy Cookey F, LPN Outcome: Completed/Met 08/03/2019 0956 by Amanda Cockayne, LPN Outcome: Progressing   Problem: RH BLADDER ELIMINATION Goal: RH STG MANAGE BLADDER WITH ASSISTANCE Description: STG Manage Bladder With min/mod Assistance 08/03/2019 1139 by Amanda Cockayne, LPN Outcome: Completed/Met 08/03/2019 0956 by Amanda Cockayne, LPN Outcome: Progressing   Problem: RH SKIN INTEGRITY Goal: RH STG SKIN FREE OF INFECTION/BREAKDOWN Description: Patients skin will remain free from further breakdown or injury during rehab stay with min/mod assist. 08/03/2019 1139 by Amanda Cockayne, LPN Outcome: Completed/Met 08/03/2019 0956 by Amanda Cockayne, LPN Outcome: Progressing Goal: RH STG MAINTAIN SKIN INTEGRITY WITH ASSISTANCE Description: STG Maintain Skin Integrity With min/mod Assistance. 08/03/2019 1139 by Amanda Cockayne, LPN Outcome: Completed/Met 08/03/2019 0956 by Amanda Cockayne, LPN Outcome: Progressing   Problem: RH SAFETY Goal: RH STG ADHERE TO SAFETY PRECAUTIONS W/ASSISTANCE/DEVICE Description: STG Adhere to Safety Precautions With supervision/min Assistance/Device. 08/03/2019 1139 by Toy Cookey F, LPN Outcome: Completed/Met 08/03/2019 0956 by Amanda Cockayne, LPN Outcome: Progressing   Problem: RH COGNITION-NURSING Goal: RH STG USES MEMORY AIDS/STRATEGIES W/ASSIST TO PROBLEM SOLVE Description: STG Uses Memory Aids/Strategies With supervision/ min Assistance to Problem Solve. 08/03/2019 1139 by Toy Cookey F, LPN Outcome:  Completed/Met 08/03/2019 0956 by Amanda Cockayne, LPN Outcome: Progressing   Problem: RH KNOWLEDGE DEFICIT Goal: RH STG INCREASE KNOWLEDGE OF HYPERTENSION Description: Patient and caregiver will verbalize understanding of HTN including monitoring, medications, diet, exercise, and follow up care with min assist. 08/03/2019 1139 by Amanda Cockayne, LPN Outcome: Completed/Met 08/03/2019 0956 by Amanda Cockayne, LPN Outcome: Progressing Goal: RH STG INCREASE KNOWLEGDE OF HYPERLIPIDEMIA Description: Patient and caregiver will verbalize understanding of HLD including monitoring, medications, diet, exercise, and follow up care with min assist. 08/03/2019 1139 by Amanda Cockayne, LPN Outcome: Completed/Met 08/03/2019 0956 by Amanda Cockayne, LPN Outcome: Progressing Goal: RH STG INCREASE KNOWLEDGE OF STROKE PROPHYLAXIS Description: Patient and caregiver will verbalize understanding of stroke prophylaxis including monitoring, medications, diet, exercise, and follow up care with min assist. 08/03/2019 1139 by Amanda Cockayne, LPN Outcome: Completed/Met 08/03/2019 0956 by Amanda Cockayne, LPN Outcome: Progressing

## 2019-08-03 NOTE — Plan of Care (Signed)
  Problem: Consults Goal: RH STROKE PATIENT EDUCATION Description: See Patient Education module for education specifics  Outcome: Progressing   Problem: RH BOWEL ELIMINATION Goal: RH STG MANAGE BOWEL WITH ASSISTANCE Description: STG Manage Bowel with min/mod Assistance. Outcome: Progressing   Problem: RH BLADDER ELIMINATION Goal: RH STG MANAGE BLADDER WITH ASSISTANCE Description: STG Manage Bladder With min/mod Assistance Outcome: Progressing   Problem: RH SKIN INTEGRITY Goal: RH STG SKIN FREE OF INFECTION/BREAKDOWN Description: Patients skin will remain free from further breakdown or injury during rehab stay with min/mod assist. Outcome: Progressing Goal: RH STG MAINTAIN SKIN INTEGRITY WITH ASSISTANCE Description: STG Maintain Skin Integrity With min/mod Assistance. Outcome: Progressing   Problem: RH SAFETY Goal: RH STG ADHERE TO SAFETY PRECAUTIONS W/ASSISTANCE/DEVICE Description: STG Adhere to Safety Precautions With supervision/min Assistance/Device. Outcome: Progressing   Problem: RH COGNITION-NURSING Goal: RH STG USES MEMORY AIDS/STRATEGIES W/ASSIST TO PROBLEM SOLVE Description: STG Uses Memory Aids/Strategies With supervision/ min Assistance to Problem Solve. Outcome: Progressing   Problem: RH KNOWLEDGE DEFICIT Goal: RH STG INCREASE KNOWLEDGE OF HYPERTENSION Description: Patient and caregiver will verbalize understanding of HTN including monitoring, medications, diet, exercise, and follow up care with min assist. Outcome: Progressing Goal: RH STG INCREASE KNOWLEGDE OF HYPERLIPIDEMIA Description: Patient and caregiver will verbalize understanding of HLD including monitoring, medications, diet, exercise, and follow up care with min assist. Outcome: Progressing Goal: RH STG INCREASE KNOWLEDGE OF STROKE PROPHYLAXIS Description: Patient and caregiver will verbalize understanding of stroke prophylaxis including monitoring, medications, diet, exercise, and follow up care with  min assist. Outcome: Progressing   

## 2019-08-03 NOTE — Progress Notes (Signed)
PHYSICAL MEDICINE & REHABILITATION PROGRESS NOTE   Subjective/Complaints:  Discussed K+ as well as MD f/us PCP is Dr Forde Dandy   ROS: Appears to deny CP, shortness of breath, nausea, vomiting, diarrhea.  Objective:   No results found. No results for input(s): WBC, HGB, HCT, PLT in the last 72 hours. No results for input(s): NA, K, CL, CO2, GLUCOSE, BUN, CREATININE, CALCIUM in the last 72 hours.  Intake/Output Summary (Last 24 hours) at 08/03/2019 0926 Last data filed at 08/03/2019 0800 Gross per 24 hour  Intake 360 ml  Output 475 ml  Net -115 ml     Physical Exam: Vital Signs Blood pressure 128/87, pulse (!) 54, temperature (!) 97.5 F (36.4 C), temperature source Oral, resp. rate 17, SpO2 98 %. Constitutional: No distress . Vital signs reviewed. HENT: Normocephalic.  Atraumatic. Eyes: EOMI. No discharge. Cardiovascular: No JVD. Respiratory: Normal effort.  No stridor. GI: Non-distended. Skin: Warm and dry.  Intact. Psych: Confused. Musc: No edema in extremities.  No tenderness in extremities. Neuro: Alert and oriented x1 Follows limited commands, but appears to be 5/5 throughout  Assessment/Plan: 1. Functional deficits secondary to bilateral cortical and subcortical infarcts  Stable for D/C today F/u PCP in 3-4 weeks F/u PM&R 2 weeks See D/C summary See D/C instructions Care Tool:  Bathing    Body parts bathed by patient: Right arm, Left arm, Chest, Abdomen, Front perineal area, Buttocks, Right upper leg, Left upper leg, Face, Right lower leg, Left lower leg   Body parts bathed by helper: Right lower leg, Buttocks Body parts n/a: Right lower leg, Left lower leg   Bathing assist Assist Level: Supervision/Verbal cueing     Upper Body Dressing/Undressing Upper body dressing   What is the patient wearing?: Pull over shirt    Upper body assist Assist Level: Supervision/Verbal cueing    Lower Body Dressing/Undressing Lower body dressing       What is the patient wearing?: Pants, Incontinence brief     Lower body assist Assist for lower body dressing: Supervision/Verbal cueing     Toileting Toileting Toileting Activity did not occur (Clothing management and hygiene only): Refused  Toileting assist Assist for toileting: Supervision/Verbal cueing     Transfers Chair/bed transfer  Transfers assist     Chair/bed transfer assist level: Supervision/Verbal cueing     Locomotion Ambulation   Ambulation assist      Assist level: Supervision/Verbal cueing Assistive device: Walker-rolling Max distance: 200   Walk 10 feet activity   Assist     Assist level: Supervision/Verbal cueing Assistive device: Walker-rolling   Walk 50 feet activity   Assist Walk 50 feet with 2 turns activity did not occur: Safety/medical concerns  Assist level: Supervision/Verbal cueing Assistive device: Walker-rolling    Walk 150 feet activity   Assist Walk 150 feet activity did not occur: Safety/medical concerns  Assist level: Supervision/Verbal cueing Assistive device: Walker-rolling    Walk 10 feet on uneven surface  activity   Assist Walk 10 feet on uneven surfaces activity did not occur: Safety/medical concerns   Assist level: Supervision/Verbal cueing Assistive device: Aeronautical engineer Will patient use wheelchair at discharge?: No Type of Wheelchair: Manual    Wheelchair assist level: Minimal Assistance - Patient > 75% Max wheelchair distance: 100    Wheelchair 50 feet with 2 turns activity    Assist        Assist Level: Minimal Assistance - Patient > 75%  Wheelchair 150 feet activity     Assist  Wheelchair 150 feet activity did not occur: Safety/medical concerns       Blood pressure 128/87, pulse (!) 54, temperature (!) 97.5 F (36.4 C), temperature source Oral, resp. rate 17, SpO2 98 %.     Medical Problem List and Plan: 1.  Mild aphasia with  decreased functional mobility secondary to small acute left basal ganglia corona radiata and multiple small subacute bilateral occipital and right frontal lobe infarcts ( ?embolic)  as well as history of TIA/CVA 2019 followed by Dr.Jaffe    D/C today after BMET reviewed  2.  Antithrombotics: -DVT/anticoagulation: SCDs             -antiplatelet therapy: Aspirin 81 mg daily 3. Pain Management: Tylenol as needed 4. Mood: Provide emotional support             -antipsychotic agents: N/A 5. Neuropsych: This patient is not capable of making decisions on his own behalf.  Telesitter for safety due to impulsivity 6. Skin/Wound Care: Routine skin checks 7. Fluids/Electrolytes/Nutrition: encourage PO 8.  Hypertension.  Monitor with increased mobility Vitals:   08/02/19 1934 08/03/19 0429  BP: (!) 171/63 128/87  Pulse: (!) 58 (!) 54  Resp: 18 17  Temp: 97.8 F (36.6 C) (!) 97.5 F (36.4 C)  SpO2: 99% 98%   Aldactone, cozaar and metoprolol  Cozaar increased to 50 mg on 12/18  Some labiliy further med changes as outpt per PCP  9.  Acute systolic heart failure.compensated   Follow-up per cardiology services.  Continue Aldactone as well as beta-blocker.  Echo showing EF of 35-40% which was decreased from Echo 1 month prior with EF 55-60%.  There were no vitals filed for this visit.   10.  Hyperlipidemia.  Lipitor 11.  Remote tobacco abuse.  Counseling 12.  Enterococcus UTI S to amp 7d course of amox completed   13. Hypokalemia  Repeat K+ pending   Supplement initiated 14.  AKI versus CKD  GFR 54 on 12/18, labs ordered for tomorrow  LOS: 17 days A FACE TO FACE EVALUATION WAS PERFORMED  Erick Colace 08/03/2019, 9:26 AM

## 2019-08-03 NOTE — Progress Notes (Signed)
Social Work Discharge Note   The overall goal for the admission was met for:   Discharge location: [place]  Length of Stay: Yes-17 DAYS  Discharge activity level: Yes-SUPERVISION LEVEL-CUES  Home/community participation: Yes  Services provided included: MD, RD, PT, OT, SLP, RN, CM, Pharmacy and SW  Financial Services: Private Insurance: [facility]  Follow-up services arranged: Outpatient: [facility] , OT AND SPT-THROUGH [facility], DME: [facility]SPORT [facility] and Patient/Family has no preference for HH/DME agencies  Comments (or additional information):WIFE WAS IN FOR TRAINING AND FEELS COMFORTABLE WITH HIS CARE. PLANS ON GETTING SITTER TO BE WITH HIM WHEN SHE RUNS [place]  Patient/Family verbalized understanding of follow-up arrangements: Yes  Individual responsible for coordination of the follow-up plan: [name]-WIFE  Confirmed correct DME delivered: Elease Hashimoto 08/03/2019    Elease Hashimoto

## 2019-08-03 NOTE — Progress Notes (Signed)
Pt D/C to home. Accompanied by wife. D/C instructions given by Linna Hoff, PA. Belongings and equipment sent with patient. No further questions from patient or family. Amanda Cockayne, LPN

## 2019-08-04 ENCOUNTER — Telehealth: Payer: Self-pay

## 2019-08-04 NOTE — Telephone Encounter (Signed)
  TRANSITIONAL CARE CALL attempted, no answer, left voicemail to return call.  Appointment Date and Time: 08-12-2019 / 1100am With: Dr. Ranell Patrick

## 2019-08-05 NOTE — Telephone Encounter (Signed)
Transitional Care Call completed, Appointment confirmed.   Patients wife is concerned about this appointment.  The patient is living at [facility] and rehabilitation services are being worked out through the care facility.  She is overwhelmed with the number of different doctors that he will be seeing in the ensuing months.  I tried to the best of my ability to explain what our providers do coordinating therapies, stroke management ie.Marland KitchenMarland KitchenShe states she is going to discuss with the patient regarding this appointment that is scheduled. It might be prudent for Dr. Ranell Patrick or Dr. Letta Pate to contact the patients spouse at [phone] to discuss how to move forward with Alfredo Bach being in the picture.  Transitional Care Questions    1. Are you/is patient experiencing any problems since coming home? No bowel movement X2 days? Are there any questions regarding any aspect of care? No  2. Are there any questions regarding medications administration/dosing? Other than Potassium, No Are meds being taken as prescribed? Yes Patient should review meds with caller to confirm   3. Have there been any falls? No  4. Has Home Health been to the house and/or have they contacted you? Patient is a Actuary. Spouse is working with them to arrange care at the facility. If not, have you tried to contact them? Can we help you contact them?   5. Are bowels and bladder emptying properly? No BM X2 days Are there any unexpected incontinence issues? If applicable, is patient following bowel/bladder programs?   6. Any fevers, problems with breathing, unexpected pain? No  7. Are there any skin problems or new areas of breakdown?  Jock itch being treated  8. Has the patient/family member arranged specialty MD follow up (ie cardiology/neurology/renal/surgical/etc)?   No Can we help arrange?   9. Does the patient need any other services or support that we can help arrange? Patients spouse is trying to arrange  home health aide through [facility]  10. Are caregivers following through as expected in assisting the patient? Patient's spouse says this is all new to her.  Adjustment has been difficult but she is managing with the help of the SNF  11. Has the patient quit smoking, drinking alcohol, or using drugs as recommended? Patient is not doing illicit drugs or smoking. Apparently, he did have a conversation with Dr. Linna Hoff (Silvestre Mesi, PA) about having an occasional beer. He has not heard back about whether or not it is okay.  Patient is supposed to follow up with Dr. Letta Pate according to discharge summary.  Patients 1st appointment is scheduled with Dr. Ranell Patrick Wednesday, December 30th, 2020 at 11:00am.  This message forwarded to Dr. Letta Pate and Dr. Ranell Patrick for review

## 2019-08-11 NOTE — Telephone Encounter (Signed)
Hi! If patient's wife is too overwhelmed to bring him in, please offer phone call visit with me for review of therapy, discharge medications, and quality of life issues. If she refuses this, that is fine. Thank you!

## 2019-08-12 ENCOUNTER — Encounter
Payer: Medicare Other | Attending: Physical Medicine and Rehabilitation | Admitting: Physical Medicine and Rehabilitation

## 2019-08-12 ENCOUNTER — Encounter: Payer: Self-pay | Admitting: Physical Medicine and Rehabilitation

## 2019-08-12 ENCOUNTER — Other Ambulatory Visit: Payer: Self-pay

## 2019-08-12 DIAGNOSIS — N3944 Nocturnal enuresis: Secondary | ICD-10-CM | POA: Diagnosis not present

## 2019-08-12 DIAGNOSIS — Z8673 Personal history of transient ischemic attack (TIA), and cerebral infarction without residual deficits: Secondary | ICD-10-CM | POA: Diagnosis not present

## 2019-08-12 DIAGNOSIS — I639 Cerebral infarction, unspecified: Secondary | ICD-10-CM

## 2019-08-12 NOTE — Progress Notes (Signed)
Subjective:    Patient ID: Nicholas Hancock, male    DOB: [birth year], 84 y.o.   MRN: [id number]  HPI   Due to national recommendations of social distancing because of COVID 63, an audio/video tele-health visit is felt to be the most appropriate encounter for this patient at this time. See MyChart message from today for the patient's consent to a tele-health encounter with [facility]. This is a follow up tele-visit via phone. The patient is at home. MD is at office.   Mr. 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   He has been living at facility and receiving PT and OT through facility daily. He has been able to ambulate well. His wife assists with ADLs, such as bathing and dressing.   His wife's main concern is that he wakes frequently at night, sometimes up to 8 times. She wakes with him and helps him to go to the bathroom. He does have an enlarged prostate and follows with a specialist.   The family otherwise has no complaints or concerns.   Pain Inventory Average Pain 0 Pain Right Now 0 My pain is na  In the last 24 hours, has pain interfered with the following? General activity 0 Relation with others 0 Enjoyment of life 0 What TIME of day is your pain at its worst? na Sleep (in general) Fair  Pain is worse with: na Pain improves with: na Relief from Meds: na  Mobility walk without assistance  Function retired  Neuro/Psych No problems in this area  Prior Studies Any changes since last visit?  no  Physicians involved in your care Any changes since last visit?  no   Family History  Problem Relation Age of Onset  . Stroke Father   . Diabetes Father        `  . Cancer Father        unknown  . Heart disease Mother   . Cancer Mother        unknown   Social History   Socioeconomic History  . Marital status: Married    Spouse name:  [name]  . Number of children: 4  . Years of education: Not on file  . Highest education level: Master's degree (e.g., MA, MS, MEng, MEd, MSW, MBA)  Occupational History  . Occupation: retired  Tobacco Use  . Smoking status: Former Smoker    Years: 20.00    Types: Pipe    Quit date: 05/20/1985    Years since quitting: 34.2  . Smokeless tobacco: Never Used  Substance and Sexual Activity  . Alcohol use: Yes    Comment: approx 4 beers a week  . Drug use: No  . Sexual activity: Not on file  Other Topics Concern  . Not on file  Social History Narrative   Patient is right-handed. He lives with his wife, [name], in a one story home. He drinks 2-3 cups of coffee that is 1/3 caffeine. He walks 2 miles, 7 days a week. Prior to retirement, he worked in Technical brewer for [employer].    Social Determinants of Health   Financial Resource Strain:   . Difficulty of Paying Living Expenses: Not on file  Food Insecurity:   . Worried About Programme researcher, broadcasting/film/video in the Last Year: Not on file  . Ran Out of Food in the Last Year: Not on file  Transportation Needs:   .  Lack of Transportation (Medical): Not on file  . Lack of Transportation (Non-Medical): Not on file  Physical Activity:   . Days of Exercise per Week: Not on file  . Minutes of Exercise per Session: Not on file  Stress:   . Feeling of Stress : Not on file  Social Connections:   . Frequency of Communication with Friends and Family: Not on file  . Frequency of Social Gatherings with Friends and Family: Not on file  . Attends Religious Services: Not on file  . Active Member of Clubs or Organizations: Not on file  . Attends Archivist Meetings: Not on file  . Marital Status: Not on file   Past Surgical History:  Procedure Laterality Date  . HAND SURGERY  09/21/1987   Enlarged mass on right palm overlying right long finger metacarpophalangeal joint removed  . INGUINAL HERNIA REPAIR  07/15/2012   Procedure: LAPAROSCOPIC BILATERAL  INGUINAL HERNIA REPAIR;  Surgeon: Pedro Earls, MD;  Location: [facility];  Service: General;  Laterality: Bilateral;  Laparoscopic Bilateral Inguinal Hernia Repairs with Mesh  . left knee arthroscopy  3 years ago  . PELVIC FRACTURE SURGERY     chippped in high school  . TONSILLECTOMY AND ADENOIDECTOMY     Past Medical History:  Diagnosis Date  . 1st degree AV block   . 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 Aortic valve insufficiency   . Arthritis    hands  . CVA (cerebral vascular accident) ([place])   . Elevated PSA    monitored by Dr. Rosana Hoes  . Hypertension    takes diuretic for adrenal gland dysfunction, BP will be high without diuretic  . Vitamin D deficiency    There were no vitals taken for this visit.  Opioid Risk Score:   Fall Risk Score:  `1  Depression screen PHQ 2/9  No flowsheet data found.  Review of Systems: negative except as indicated in HPI     Objective:   Physical Exam Not performed given virtual visit.        Assessment & Plan:  Mr. 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   1) Left basal gaanglia stroke --Continue PT and OT to improve mobility and ADLs and to maximize independence and safety. Patient is progressing well.   2) Nocturnal enuresis --Makes frequent trips to the bathroom with minimal urine output. Mr. Mccamish follows with a prostate specialist and I advised Mrs. Lederman to make an appointment for a physical exam, as medication to minimize prostate hypertrophy could possibly reduce his frequent urination. If this is not indicated, then I provided her with our office number to follow-up regarding evaluation for agitation/insomnia that may be contributing to nightly wakings.  Fifteen minutes of face to face patient care time were spent during this visit. All questions were  encouraged and answered.  Follow up with me PRN .

## 2019-08-25 ENCOUNTER — Ambulatory Visit: Payer: Medicare Other | Admitting: Cardiology

## 2019-09-21 ENCOUNTER — Ambulatory Visit (INDEPENDENT_AMBULATORY_CARE_PROVIDER_SITE_OTHER): Payer: Medicare Other | Admitting: Cardiology

## 2019-09-21 ENCOUNTER — Other Ambulatory Visit: Payer: Self-pay

## 2019-09-21 ENCOUNTER — Encounter: Payer: Self-pay | Admitting: Cardiology

## 2019-09-21 VITALS — BP 170/60 | HR 96 | Ht 68.0 in | Wt 157.0 lb

## 2019-09-21 DIAGNOSIS — I351 Nonrheumatic aortic (valve) insufficiency: Secondary | ICD-10-CM | POA: Diagnosis not present

## 2019-09-21 DIAGNOSIS — E269 Hyperaldosteronism, unspecified: Secondary | ICD-10-CM

## 2019-09-21 DIAGNOSIS — E785 Hyperlipidemia, unspecified: Secondary | ICD-10-CM

## 2019-09-21 DIAGNOSIS — R002 Palpitations: Secondary | ICD-10-CM | POA: Diagnosis not present

## 2019-09-21 MED ORDER — LOSARTAN POTASSIUM 100 MG PO TABS: 100.0000 mg | ORAL_TABLET | Freq: Every day | ORAL | 3 refills | Status: DC

## 2019-09-21 NOTE — Patient Instructions (Addendum)
Medication Instructions:  Please increase your Losartan to 100 mg a day.  Continue all other medications as listed.  *If you need a refill on your cardiac medications before your next appointment, please call your pharmacy*  Lab: Please have blood work today (Lipid)  Follow-Up: At [facility], you and your health needs are our priority.  As part of our continuing mission to provide you with exceptional heart care, we have created designated Provider Care Teams.  These Care Teams include your primary Cardiologist (physician) and Advanced Practice Providers (APPs -  Physician Assistants and Nurse Practitioners) who all work together to provide you with the care you need, when you need it.  Your next appointment:   2 month(s)  The format for your next appointment:   Virtual Visit   Provider:   You may see Donato Schultz, MD or one of the following Advanced Practice Providers on your designated Care Team:    Norma Fredrickson, NP  Nada Boozer, NP  Georgie Chard, NP  Thank you for choosing [facility]!!    YOUR CARDIOLOGY TEAM HAS ARRANGED FOR AN E-VISIT FOR YOUR APPOINTMENT - PLEASE REVIEW IMPORTANT INFORMATION BELOW SEVERAL DAYS PRIOR TO YOUR APPOINTMENT  Due to the recent COVID-19 pandemic, we are transitioning in-person office visits to tele-medicine visits in an effort to decrease unnecessary exposure to our patients, their families, and staff. These visits are billed to your insurance just like a normal visit is. We also encourage you to sign up for MyChart if you have not already done so. You will need a smartphone if possible. For patients that do not have this, we can still complete the visit using a regular telephone but do prefer a smartphone to enable video when possible. You may have a family member that lives with you that can help. If possible, we also ask that you have a blood pressure cuff and scale at home to measure your blood pressure, heart rate and weight prior  to your scheduled appointment. Patients with clinical needs that need an in-person evaluation and testing will still be able to come to the office if absolutely necessary. If you have any questions, feel free to call our office.   YOUR PROVIDER WILL BE USING THE FOLLOWING PLATFORM TO COMPLETE YOUR VISIT: [email or web address]  THE DAY OF YOUR APPOINTMENT  Approximately 15 minutes prior to your scheduled appointment, you will receive a telephone call from one of [facility] team - your caller ID may say "Unknown caller."  Our staff will confirm medications, vital signs for the day and any symptoms you may be experiencing. Please have this information available prior to the time of visit start. It may also be helpful for you to have a pad of paper and pen handy for any instructions given during your visit. They will also walk you through joining the smartphone meeting if this is a video visit.    CONSENT FOR TELE-HEALTH VISIT - PLEASE REVIEW  I hereby voluntarily request, consent and authorize [facility] and its employed or contracted physicians, physician assistants, nurse practitioners or other licensed health care professionals (the Practitioner), to provide me with telemedicine health care services (the "Services") as deemed necessary by the treating Practitioner. I acknowledge and consent to receive the Services by the Practitioner via telemedicine. I understand that the telemedicine visit will involve communicating with the Practitioner through live audiovisual communication technology and the disclosure of certain medical information by electronic transmission. I acknowledge that I have been given the opportunity  to request an in-person assessment or other available alternative prior to the telemedicine visit and am voluntarily participating in the telemedicine visit.  I understand that I have the right to withhold or withdraw my consent to the use of telemedicine in the course of my care at any time,  without affecting my right to future care or treatment, and that the Practitioner or I may terminate the telemedicine visit at any time. I understand that I have the right to inspect all information obtained and/or recorded in the course of the telemedicine visit and may receive copies of available information for a reasonable fee.  I understand that some of the potential risks of receiving the Services via telemedicine include:  Marland Kitchen Delay or interruption in medical evaluation due to technological equipment failure or disruption; . Information transmitted may not be sufficient (e.g. poor resolution of images) to allow for appropriate medical decision making by the Practitioner; and/or  . In rare instances, security protocols could fail, causing a breach of personal health information.  Furthermore, I acknowledge that it is my responsibility to provide information about my medical history, conditions and care that is complete and accurate to the best of my ability. I acknowledge that Practitioner's advice, recommendations, and/or decision may be based on factors not within their control, such as incomplete or inaccurate data provided by me or distortions of diagnostic images or specimens that may result from electronic transmissions. I understand that the practice of medicine is not an exact science and that Practitioner makes no warranties or guarantees regarding treatment outcomes. I acknowledge that I will receive a copy of this consent concurrently upon execution via email to the email address I last provided but may also request a printed copy by calling the office of [place].    I understand that my insurance will be billed for this visit.   I have read or had this consent read to me. . I understand the contents of this consent, which adequately explains the benefits and risks of the Services being provided via telemedicine.  . I have been provided ample opportunity to ask questions regarding  this consent and the Services and have had my questions answered to my satisfaction. . I give my informed consent for the services to be provided through the use of telemedicine in my medical care  By participating in this telemedicine visit I agree to the above.

## 2019-09-21 NOTE — Progress Notes (Signed)
Cardiology Office Note:    Date:  09/21/2019   ID:  Nicholas Hancock, DOB 03/14/1935, MRN [id number]  PCP:  Reynold Bowen, MD  Cardiologist:  Candee Furbish, MD  Electrophysiologist:  None   Referring MD: Reynold Bowen, MD     History of Present Illness:    Nicholas Hancock is a 85 y.o. male here for follow-up with recent left basal ganglia embolic stroke on 27/78/2423 discharge echocardiogram showed EF of 40% without source of emboli.  Covid negative.  Short bursts of atrial tachycardia were noted but not atrial fib.  Recommendation by cardiology consulting team during hospitalization and review of notes was to start Toprol/Aldactone as well as continue low-dose aspirin.  He went through rehab. Today he is using a walker, slow shuffling gait.  Last plan was to decrease his spironolactone and increase his losartan.  Continue with metoprolol succinate.  He is a former patient of Dr. Sherryl Barters.  I was seeing him previously for 1 year follow-ups.  He had hypertension previously that responded to spironolactone.  Dr. Forde Dandy has been seeing him as well.  First-degree AV block, PACs.  He had been placed on metoprolol several years ago because of PACs.  Been tested in the past for hyperaldosteronism and was positive.  Used to work for [employer] for several years.  We have been following him as well for aortic regurgitation.  Echocardiogram 07/13/2019 while in the hospital for stroke:  1. Left ventricular ejection fraction, by visual estimation, is 35 to  40%. The left ventricle has moderately decreased function. Mildly  increased left ventricular posterior wall thickness. There is no left  ventricular hypertrophy.  2. Mildly dilated left ventricular internal cavity size.  3. Compared to report from Oct 2020, LVEF is depressed.  4. Global right ventricle has normal systolic function.The right  ventricular size is normal. No increase in right ventricular wall  thickness.  5. Left atrial size  was moderately dilated.  6. Right atrial size was normal.  7. Trivial pericardial effusion is present.  8. The mitral valve is grossly normal. Mild mitral valve regurgitation.  9. The tricuspid valve is normal in structure. Tricuspid valve  regurgitation mild-moderate.  10. The aortic valve is tricuspid. Aortic valve regurgitation is mild.  Mild aortic valve sclerosis without stenosis.  11. The pulmonic valve was grossly normal. Pulmonic valve regurgitation is  trivial.  12. Normal pulmonary artery systolic pressure.  13. The interatrial septum was not assessed.   Past Medical History:  Diagnosis Date  . 1st degree AV block   . Adrenal gland dysfunction ([place])    hyperaldesteroa- takes diuretic for over production of aldosterone  . Aortic valve insufficiency   . Arthritis    hands  . CVA (cerebral vascular accident) ([place])   . Elevated PSA    monitored by Dr. Rosana Hoes  . Hypertension    takes diuretic for adrenal gland dysfunction, BP will be high without diuretic  . Vitamin D deficiency     Past Surgical History:  Procedure Laterality Date  . HAND SURGERY  09/21/1987   Enlarged mass on right palm overlying right long finger metacarpophalangeal joint removed  . INGUINAL HERNIA REPAIR  07/15/2012   Procedure: LAPAROSCOPIC BILATERAL INGUINAL HERNIA REPAIR;  Surgeon: Pedro Earls, MD;  Location: [facility];  Service: General;  Laterality: Bilateral;  Laparoscopic Bilateral Inguinal Hernia Repairs with Mesh  . left knee arthroscopy  3 years ago  . PELVIC FRACTURE SURGERY     chippped in  high school  . TONSILLECTOMY AND ADENOIDECTOMY      Current Medications: Current Meds  Medication Sig  . acetaminophen (TYLENOL) 325 MG tablet Take 2 tablets (650 mg total) by mouth every 4 (four) hours as needed for mild pain (or temp > 37.5 C (99.5 F)).  Marland Kitchen aspirin EC 81 MG tablet Take 81 mg by mouth daily.  Marland Kitchen atorvastatin (LIPITOR) 80 MG tablet Take 1 tablet (80 mg total) by mouth daily.  .  Calcium 167 MG CAPS Take 1 capsule (167 mg total) by mouth daily.  . Cholecalciferol (VITAMIN D-3) 125 MCG (5000 UT) TABS Take 1 tablet by mouth every other day.  . Coenzyme Q10 200 MG capsule Take 200 mg by mouth daily.  . Flaxseed, Linseed, (FLAX SEEDS PO) Take 1 tablet by mouth daily.  . metoprolol succinate (TOPROL-XL) 50 MG 24 hr tablet Take 1 tablet (50 mg total) by mouth daily. Take with or immediately following a meal.  . Multiple Vitamin (MULTIVITAMIN WITH MINERALS) TABS Take 1 tablet by mouth daily.  . Multiple Vitamins-Minerals (ZINC) LOZG Take 1 lozenge by mouth daily as needed (For cold symptoms).  . Omega-3 Fatty Acids (SALMON OIL-1000 PO) Take 1,000 mg by mouth daily.  Marland Kitchen spironolactone (ALDACTONE) 50 MG tablet Take 1 tablet (50 mg total) by mouth daily at 6 (six) AM.  . Ubiquinol 100 MG CAPS Take 1 capsule by mouth daily.  . vitamin B-12 (CYANOCOBALAMIN) 1000 MCG tablet Take 1,000 mcg by mouth daily.  . vitamin C (ASCORBIC ACID) 500 MG tablet Take 500 mg by mouth daily.  . [DISCONTINUED] losartan (COZAAR) 50 MG tablet Take 1 tablet (50 mg total) by mouth daily.     Allergies:   Patient has no known allergies.   Social History   Socioeconomic History  . Marital status: Married    Spouse name: [name]  . Number of children: 4  . Years of education: Not on file  . Highest education level: Master's degree (e.g., MA, MS, MEng, MEd, MSW, MBA)  Occupational History  . Occupation: retired  Tobacco Use  . Smoking status: Former Smoker    Years: 20.00    Types: Pipe    Quit date: 05/20/1985    Years since quitting: 34.3  . Smokeless tobacco: Never Used  Substance and Sexual Activity  . Alcohol use: Yes    Comment: approx 4 beers a week  . Drug use: No  . Sexual activity: Not on file  Other Topics Concern  . Not on file  Social History Narrative   Patient is right-handed. He lives with his wife, [name], in a one story home. He drinks 2-3 cups of coffee that is 1/3 caffeine.  He walks 2 miles, 7 days a week. Prior to retirement, he worked in Technical brewer for [employer].    Social Determinants of Health   Financial Resource Strain:   . Difficulty of Paying Living Expenses: Not on file  Food Insecurity:   . Worried About Programme researcher, broadcasting/film/video in the Last Year: Not on file  . Ran Out of Food in the Last Year: Not on file  Transportation Needs:   . Lack of Transportation (Medical): Not on file  . Lack of Transportation (Non-Medical): Not on file  Physical Activity:   . Days of Exercise per Week: Not on file  . Minutes of Exercise per Session: Not on file  Stress:   . Feeling of Stress : Not on file  Social Connections:   . Frequency  of Communication with Friends and Family: Not on file  . Frequency of Social Gatherings with Friends and Family: Not on file  . Attends Religious Services: Not on file  . Active Member of Clubs or Organizations: Not on file  . Attends Banker Meetings: Not on file  . Marital Status: Not on file     Family History: The patient's family history includes Cancer in his father and mother; Diabetes in his father; Heart disease in his mother; Stroke in his father.  ROS:   Please see the history of present illness.    No syncope no bleeding no chest pain no shortness of breath all other systems reviewed and are negative.  EKGs/Labs/Other Studies Reviewed:    The following studies were reviewed today: Hospital records reviewed, cardiology progress note from the hospital reviewed.  Echocardiogram reviewed.  Lab work from outside source previous LDL 179  EKG:  No new  Recent Labs: 07/20/2019: ALT 16; Hemoglobin 13.5; Platelets 151 08/03/2019: BUN 18; Creatinine, Ser 1.28; Potassium 3.4; Sodium 141  Recent Lipid Panel    Component Value Date/Time   CHOL 226 (H) 07/13/2019 0500   CHOL 184 12/26/2017 1053   TRIG 79 07/13/2019 0500   HDL 31 (L) 07/13/2019 0500   HDL 27 (L) 12/26/2017 1053   CHOLHDL 7.3 07/13/2019 0500   VLDL  16 07/13/2019 0500   LDLCALC 179 (H) 07/13/2019 0500   LDLCALC 129 (H) 12/26/2017 1053    Physical Exam:    VS:  BP (!) 170/60   Pulse 96   Ht 5\' 8"  (1.727 m)   Wt 157 lb (71.2 kg)   SpO2 (!) 58%   BMI 23.87 kg/m     Wt Readings from Last 3 Encounters:  09/21/19 157 lb (71.2 kg)  07/12/19 160 lb (72.6 kg)  10/27/18 159 lb (72.1 kg)     GEN: Walker, elderly,  Well nourished, well developed in no acute distress HEENT: Normal NECK: No JVD; No carotid bruits LYMPHATICS: No lymphadenopathy CARDIAC: RRR, 2/6 diastolic M, no rubs, gallops RESPIRATORY:  Clear to auscultation without rales, wheezing or rhonchi  ABDOMEN: Soft, non-tender, non-distended MUSCULOSKELETAL:  No edema; No deformity  SKIN: Warm and dry NEUROLOGIC:  Alert and oriented x 3 PSYCHIATRIC:  Normal affect   ASSESSMENT:    1. Aortic valve insufficiency, etiology of cardiac valve disease unspecified   2. Hyperaldosteronism (HCC)   3. Palpitations   4. Hyperlipidemia, unspecified hyperlipidemia type    PLAN:    In order of problems listed above:  Stroke November 2020 -Continuing with secondary risk factor prevention, aspirin, statin, trying to improve blood pressure control.  Hyperlipidemia -ALT 16, prior LDL 179.  He is now on atorvastatin 80 mg.  We will recheck lipid panel.  Uncontrolled hypertension-previously diagnosed hyper aldosteronism -At [facility] he is having similar numbers 170/60 for instance.  I will increase his losartan to 100 mg a day.  In 2 months, he will have a virtual visit with APP to check up.  PACs -Continue with metoprolol.  Doing well.  No atrial fibrillation detected in the hospital setting.  Aortic regurgitation -Mild.  Murmur heard on exam.  Should be of no clinical significance.  Spoke with his wife in the lobby.  She help define further historical items for me.  Blood pressure has been elevated at [facility].  I would like for him in 2 months to have a visit  with APP.  Follow-up with me in 1 year.  Medication Adjustments/Labs and Tests Ordered: Current medicines are reviewed at length with the patient today.  Concerns regarding medicines are outlined above.  Orders Placed This Encounter  Procedures  . Lipid panel   Meds ordered this encounter  Medications  . losartan (COZAAR) 100 MG tablet    Sig: Take 1 tablet (100 mg total) by mouth daily.    Dispense:  90 tablet    Refill:  3    Dose increase - please d/c any prior losartan rx    Patient Instructions  Medication Instructions:  Please increase your Losartan to 100 mg a day.  Continue all other medications as listed.  *If you need a refill on your cardiac medications before your next appointment, please call your pharmacy*  Lab: Please have blood work today (Lipid)  Follow-Up: At [facility], you and your health needs are our priority.  As part of our continuing mission to provide you with exceptional heart care, we have created designated Provider Care Teams.  These Care Teams include your primary Cardiologist (physician) and Advanced Practice Providers (APPs -  Physician Assistants and Nurse Practitioners) who all work together to provide you with the care you need, when you need it.  Your next appointment:   2 month(s)  The format for your next appointment:   Virtual Visit   Provider:   You may see Donato Schultz, MD or one of the following Advanced Practice Providers on your designated Care Team:    Norma Fredrickson, NP  Nada Boozer, NP  Georgie Chard, NP  Thank you for choosing [facility]!!    YOUR CARDIOLOGY TEAM HAS ARRANGED FOR AN E-VISIT FOR YOUR APPOINTMENT - PLEASE REVIEW IMPORTANT INFORMATION BELOW SEVERAL DAYS PRIOR TO YOUR APPOINTMENT  Due to the recent COVID-19 pandemic, we are transitioning in-person office visits to tele-medicine visits in an effort to decrease unnecessary exposure to our patients, their families, and staff. These visits are  billed to your insurance just like a normal visit is. We also encourage you to sign up for MyChart if you have not already done so. You will need a smartphone if possible. For patients that do not have this, we can still complete the visit using a regular telephone but do prefer a smartphone to enable video when possible. You may have a family member that lives with you that can help. If possible, we also ask that you have a blood pressure cuff and scale at home to measure your blood pressure, heart rate and weight prior to your scheduled appointment. Patients with clinical needs that need an in-person evaluation and testing will still be able to come to the office if absolutely necessary. If you have any questions, feel free to call our office.   YOUR PROVIDER WILL BE USING THE FOLLOWING PLATFORM TO COMPLETE YOUR VISIT: [email or web address]  THE DAY OF YOUR APPOINTMENT  Approximately 15 minutes prior to your scheduled appointment, you will receive a telephone call from one of [facility] team - your caller ID may say "Unknown caller."  Our staff will confirm medications, vital signs for the day and any symptoms you may be experiencing. Please have this information available prior to the time of visit start. It may also be helpful for you to have a pad of paper and pen handy for any instructions given during your visit. They will also walk you through joining the smartphone meeting if this is a video visit.    CONSENT FOR TELE-HEALTH VISIT - PLEASE REVIEW  I  hereby voluntarily request, consent and authorize [facility] and its employed or contracted physicians, physician assistants, nurse practitioners or other licensed health care professionals (the Practitioner), to provide me with telemedicine health care services (the "Services") as deemed necessary by the treating Practitioner. I acknowledge and consent to receive the Services by the Practitioner via telemedicine. I understand that the telemedicine visit will  involve communicating with the Practitioner through live audiovisual communication technology and the disclosure of certain medical information by electronic transmission. I acknowledge that I have been given the opportunity to request an in-person assessment or other available alternative prior to the telemedicine visit and am voluntarily participating in the telemedicine visit.  I understand that I have the right to withhold or withdraw my consent to the use of telemedicine in the course of my care at any time, without affecting my right to future care or treatment, and that the Practitioner or I may terminate the telemedicine visit at any time. I understand that I have the right to inspect all information obtained and/or recorded in the course of the telemedicine visit and may receive copies of available information for a reasonable fee.  I understand that some of the potential risks of receiving the Services via telemedicine include:  Marland Kitchen Delay or interruption in medical evaluation due to technological equipment failure or disruption; . Information transmitted may not be sufficient (e.g. poor resolution of images) to allow for appropriate medical decision making by the Practitioner; and/or  . In rare instances, security protocols could fail, causing a breach of personal health information.  Furthermore, I acknowledge that it is my responsibility to provide information about my medical history, conditions and care that is complete and accurate to the best of my ability. I acknowledge that Practitioner's advice, recommendations, and/or decision may be based on factors not within their control, such as incomplete or inaccurate data provided by me or distortions of diagnostic images or specimens that may result from electronic transmissions. I understand that the practice of medicine is not an exact science and that Practitioner makes no warranties or guarantees regarding treatment outcomes. I acknowledge that  I will receive a copy of this consent concurrently upon execution via email to the email address I last provided but may also request a printed copy by calling the office of [facility].    I understand that my insurance will be billed for this visit.   I have read or had this consent read to me. . I understand the contents of this consent, which adequately explains the benefits and risks of the Services being provided via telemedicine.  . I have been provided ample opportunity to ask questions regarding this consent and the Services and have had my questions answered to my satisfaction. . I give my informed consent for the services to be provided through the use of telemedicine in my medical care  By participating in this telemedicine visit I agree to the above.     Signed, Donato Schultz, MD  09/21/2019 2:37 PM    [facility]

## 2019-09-22 LAB — LIPID PANEL
Chol/HDL Ratio: 3.4 ratio (ref 0.0–5.0)
Cholesterol, Total: 94 mg/dL — ABNORMAL LOW (ref 100–199)
HDL: 28 mg/dL — ABNORMAL LOW (ref >39)
LDL Chol Calc (NIH): 49 mg/dL (ref 0–99)
Triglycerides: 82 mg/dL (ref 0–149)
VLDL Cholesterol Cal: 17 mg/dL (ref 5–40)

## 2019-10-23 ENCOUNTER — Ambulatory Visit: Payer: Medicare Other | Admitting: Neurology

## 2019-11-16 NOTE — Progress Notes (Unsigned)
{Choose 1 Note Type (Video or Telephone):[phone]}   The patient was identified using 2 identifiers.  Date:  11/16/2019   ID:  Nicholas Hancock, DOB 08-20-1934, MRN [id number]  {Patient [phone]::"Home"} {Provider [phone]::"Home"}  PCP:  Adrian Prince, MD  Cardiologist:  Donato Schultz, MD *** Electrophysiologist:  None   Evaluation Performed:  {Choose Visit Type:[phone]::"Follow-Up Visit"}  Chief Complaint:  ***  History of Present Illness:    Nicholas Hancock is a 85 y.o. male with HTN follow up  follow-up with recent left basal ganglia embolic stroke on 08/03/2019 discharge echocardiogram showed EF of 40% without source of emboli.  Covid negative.  Short bursts of atrial tachycardia were noted but not atrial fib.  Recommendation by cardiology consulting team during hospitalization and review of notes was to start Toprol/Aldactone as well as continue low-dose aspirin.  He went through rehab. Today he is using a walker, slow shuffling gait.  Last plan was to decrease his spironolactone and increase his losartan.  Continue with metoprolol succinate.  He is a former patient of Dr. Yevonne Pax.  I was seeing him previously for 1 year follow-ups.  He had hypertension previously that responded to spironolactone.  Dr. Evlyn Kanner has been seeing him as well.  First-degree AV block, PACs.  He had been placed on metoprolol several years ago because of PACs.  Been tested in the past for hyperaldosteronism and was positive.  Used to work for [employer] for several years.  We have been following him as well for aortic regurgitation.  Echocardiogram 07/13/2019 while in the hospital for stroke:  1. Left ventricular ejection fraction, by visual estimation, is 35 to  40%. The left ventricle has moderately decreased function. Mildly  increased left ventricular posterior wall thickness. There is no left  ventricular hypertrophy.  2. Mildly dilated left ventricular internal  cavity size.  3. Compared to report from Oct 2020, LVEF is depressed.  4. Global right ventricle has normal systolic function.The right  ventricular size is normal. No increase in right ventricular wall  thickness.  5. Left atrial size was moderately dilated.  6. Right atrial size was normal.  7. Trivial pericardial effusion is present.  8. The mitral valve is grossly normal. Mild mitral valve regurgitation.  9. The tricuspid valve is normal in structure. Tricuspid valve  regurgitation mild-moderate.  10. The aortic valve is tricuspid. Aortic valve regurgitation is mild.  Mild aortic valve sclerosis without stenosis.  11. The pulmonic valve was grossly normal. Pulmonic valve regurgitation is  trivial.  12. Normal pulmonary artery systolic pressure.  13. The interatrial septum was not assessed.   The patient {does/does not:200015} have symptoms concerning for COVID-19 infection (fever, chills, cough, or new shortness of breath).    Last visit with BP 170/60 and losartan increased to 100 mg a day. LDL 49 Tchol 94, TG 82 HDL 28  Needs BMP    Past Medical History:  Diagnosis Date  . 1st degree AV block   . 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 Aortic valve insufficiency   . Arthritis    hands  . CVA (cerebral vascular accident) ([facility])   . Elevated PSA    monitored by Dr. Earlene Plater  . Hypertension    takes diuretic for adrenal gland dysfunction, BP will be high without diuretic  . Vitamin D deficiency    Past Surgical History:  Procedure Laterality Date  . HAND SURGERY  09/21/1987   Enlarged  mass on right palm overlying right long finger metacarpophalangeal joint removed  . INGUINAL HERNIA REPAIR  07/15/2012   Procedure: LAPAROSCOPIC BILATERAL INGUINAL HERNIA REPAIR;  Surgeon: Pedro Earls, MD;  Location: [facility];  Service: General;  Laterality: Bilateral;  Laparoscopic Bilateral Inguinal Hernia  Repairs with Mesh  . left knee arthroscopy  3 years ago  . PELVIC FRACTURE SURGERY     chippped in high school  . TONSILLECTOMY AND ADENOIDECTOMY       No outpatient medications have been marked as taking for the 11/17/19 encounter (Appointment) with Isaiah Serge, NP.     Allergies:   Patient has no known allergies.   Social History   Tobacco Use  . Smoking status: Former Smoker    Years: 20.00    Types: Pipe    Quit date: 05/20/1985    Years since quitting: 34.5  . Smokeless tobacco: Never Used  Substance Use Topics  . Alcohol use: Yes    Comment: approx 4 beers a week  . Drug use: No     Family Hx: The patient's family history includes Cancer in his father and mother; Diabetes in his father; Heart disease in his mother; Stroke in his father.  ROS:   Please see the history of present illness.    *** All other systems reviewed and are negative.   Prior CV studies:   The following studies were reviewed today:  ***  Labs/Other Tests and Data Reviewed:    EKG:  {EKG/Telemetry Strips Reviewed:[phone]}  Recent Labs: 07/20/2019: ALT 16; Hemoglobin 13.5; Platelets 151 08/03/2019: BUN 18; Creatinine, Ser 1.28; Potassium 3.4; Sodium 141   Recent Lipid Panel Lab Results  Component Value Date/Time   CHOL 94 (L) 09/21/2019 02:21 PM   TRIG 82 09/21/2019 02:21 PM   HDL 28 (L) 09/21/2019 02:21 PM   CHOLHDL 3.4 09/21/2019 02:21 PM   CHOLHDL 7.3 07/13/2019 05:00 AM   LDLCALC 49 09/21/2019 02:21 PM    Wt Readings from Last 3 Encounters:  09/21/19 157 lb (71.2 kg)  07/12/19 160 lb (72.6 kg)  10/27/18 159 lb (72.1 kg)     Objective:    Vital Signs:  There were no vitals taken for this visit.   {[facility] Virtual Exam (Optional):[id number]::"VITAL SIGNS:  reviewed"}  ASSESSMENT & PLAN:    1. ***  COVID-19 Education: The signs and symptoms of COVID-19 were discussed with the patient and how to seek care for testing (follow up with PCP or arrange E-visit).   ***The importance of social distancing was discussed today.  Time:   Today, I have spent *** minutes with the patient with telehealth technology discussing the above problems.     Medication Adjustments/Labs and Tests Ordered: Current medicines are reviewed at length with the patient today.  Concerns regarding medicines are outlined above.   Tests Ordered: No orders of the defined types were placed in this encounter.   Medication Changes: No orders of the defined types were placed in this encounter.   Follow Up:  {F/U [phone]} {follow up:15908}  Signed, Cecilie Kicks, NP  11/16/2019 10:14 PM    [facility]

## 2019-11-17 ENCOUNTER — Telehealth: Payer: Medicare Other | Admitting: Cardiology

## 2019-11-17 ENCOUNTER — Other Ambulatory Visit: Payer: Self-pay

## 2020-01-08 NOTE — Progress Notes (Deleted)
NEUROLOGY FOLLOW UP OFFICE NOTE  Nicholas Hancock [id number]  HISTORY OF PRESENT ILLNESS: Nicholas Hancock is an 85 year old right-handed male with HTN, aortic valve insufficiency, benign hypertensive heart disease, hyperaldosteronism, BPH and formal smoker who follows up for stroke.  UPDATE: Patient lost to follow up.  He was admitted to [facility] on 07/12/2019 for stroke.  He presented with ***.  CT head showed no acute abnormality.  MRI of brain showed small acute left corona radiata/basal ganglia infracts as well as multiple small subacute infarcts in the bilateral occipital and right frontal lobes.  Numerous chronic microhemorrhages noted in the cortical regions, concerning for cerebral amyloid angiopathy.  MRA of head showed intracranial atherosclerosis with moderate basilar artery and severe left PCP P3 stenoses.  Carotid doppler showed bilateral 1-39% ICA stenosis with antegrade flow of both vertebral arteries.  2D echocardiogram showed EF 35-40% and mild aortic valve regurgitation with no source of embolus.  LDL was 179.  Hgb A1c was 5.9.  While stroke suspicious for embolic source, he was continued on ASA 81mg  daily alone as not candidate for dual antiplatelet therapy or anticoagulation due to concern of cerebral amyloid angiopathy.  Lipitor was increased to 80mg  daily.    HISTORY: In February 2019,he was noted by family to be speaking more slowly and mildly confused. He would ask questions about things he should already know the answer. It was also noted that he seemed to be leaning to the right when he walked. Symptoms lasted a week. There is questionable facial asymmetry as her family. He denied headache, visual disturbance, or dizziness. Since then, he seems to drop objects such as coffee cups more frequently. His PCP told him to start ASA 81mg  daily.  EEG from 01/01/18 was normal.  MRI of brain with and without contrast from 01/17/18 was personally reviewed and  demonstrated diffuse cerebral and cerebellar atrophy and extensive chronic small vessel disease including microbleeds and remote lacunar infarcts and dolichoectasia of proximal intracranial vessels. Right frontal venous angioma noted without abnormal enhancement. Carotid doppler from 01/20/18 showed mild bilateral atherosclerosis and intimal thickening of the internal carotid arteries but no hemodynamically significant stenosis (<50%).  His wife also notes that for decades, his right arm and leg wouldjerk, lasting 20 seconds at a time. There is no foaming of the mouth or incontinence. It seems to be getting worse over time, maybe 4 timesin a night, about 2 to 3 times a week. He has no known history of seizures.  EEG from 01/01/2018 was normal.  PAST MEDICAL HISTORY: Past Medical History:  Diagnosis Date  . 1st degree AV block   . 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 Aortic valve insufficiency   . Arthritis    hands  . CVA (cerebral vascular accident) (HCC)   . Elevated PSA    monitored by Dr. 03/19/18  . Hypertension    takes diuretic for adrenal gland dysfunction, BP will be high without diuretic  . Vitamin D deficiency     MEDICATIONS: Current Outpatient Medications on File Prior to Visit  Medication Sig Dispense Refill  . acetaminophen (TYLENOL) 325 MG tablet Take 2 tablets (650 mg total) by mouth every 4 (four) hours as needed for mild pain (or temp > 37.5 C (99.5 F)).    03/22/18 aspirin EC 81 MG tablet Take 81 mg by mouth daily.    01/03/2018 atorvastatin (LIPITOR) 80 MG tablet Take  1 tablet (80 mg total) by mouth daily. 30 tablet 5  . Calcium 167 MG CAPS Take 1 capsule (167 mg total) by mouth daily. 30 capsule 0  . Cholecalciferol (VITAMIN D-3) 125 MCG (5000 UT) TABS Take 1 tablet by mouth every other day. 30 tablet 1  . Coenzyme Q10 200 MG capsule Take 200 mg by mouth daily.    . Flaxseed, Linseed, (FLAX SEEDS PO) Take 1  tablet by mouth daily.    Marland Kitchen losartan (COZAAR) 100 MG tablet Take 1 tablet (100 mg total) by mouth daily. 90 tablet 3  . metoprolol succinate (TOPROL-XL) 50 MG 24 hr tablet Take 1 tablet (50 mg total) by mouth daily. Take with or immediately following a meal. 30 tablet 0  . Multiple Vitamin (MULTIVITAMIN WITH MINERALS) TABS Take 1 tablet by mouth daily.    . Multiple Vitamins-Minerals (ZINC) LOZG Take 1 lozenge by mouth daily as needed (For cold symptoms).    . Omega-3 Fatty Acids (SALMON OIL-1000 PO) Take 1,000 mg by mouth daily.    Marland Kitchen spironolactone (ALDACTONE) 50 MG tablet Take 1 tablet (50 mg total) by mouth daily at 6 (six) AM. 30 tablet 1  . Ubiquinol 100 MG CAPS Take 1 capsule by mouth daily.    . vitamin B-12 (CYANOCOBALAMIN) 1000 MCG tablet Take 1,000 mcg by mouth daily.    . vitamin C (ASCORBIC ACID) 500 MG tablet Take 500 mg by mouth daily.     No current facility-administered medications on file prior to visit.    ALLERGIES: No Known Allergies  FAMILY HISTORY: Family History  Problem Relation Age of Onset  . Stroke Father   . Diabetes Father        `  . Cancer Father        unknown  . Heart disease Mother   . Cancer Mother        unknown    SOCIAL HISTORY: Social History   Socioeconomic History  . Marital status: Married    Spouse name: [name]  . Number of children: 4  . Years of education: Not on file  . Highest education level: Master's degree (e.g., MA, MS, MEng, MEd, MSW, MBA)  Occupational History  . Occupation: retired  Tobacco Use  . Smoking status: Former Smoker    Years: 20.00    Types: Pipe    Quit date: 05/20/1985    Years since quitting: 34.6  . Smokeless tobacco: Never Used  Substance and Sexual Activity  . Alcohol use: Yes    Comment: approx 4 beers a week  . Drug use: No  . Sexual activity: Not on file  Other Topics Concern  . Not on file  Social History Narrative   Patient is right-handed. He lives with his wife, [name], in a one story  home. He drinks 2-3 cups of coffee that is 1/3 caffeine. He walks 2 miles, 7 days a week. Prior to retirement, he worked in Technical brewer for [employer].    Social Determinants of Health   Financial Resource Strain:   . Difficulty of Paying Living Expenses:   Food Insecurity:   . Worried About Programme researcher, broadcasting/film/video in the Last Year:   . Barista in the Last Year:   Transportation Needs:   . Freight forwarder (Medical):   Marland Kitchen Lack of Transportation (Non-Medical):   Physical Activity:   . Days of Exercise per Week:   . Minutes of Exercise per Session:   Stress:   .  Feeling of Stress :   Social Connections:   . Frequency of Communication with Friends and Family:   . Frequency of Social Gatherings with Friends and Family:   . Attends Religious Services:   . Active Member of Clubs or Organizations:   . Attends Archivist Meetings:   Marland Kitchen Marital Status:   Intimate Partner Violence:   . Fear of Current or Ex-Partner:   . Emotionally Abused:   Marland Kitchen Physically Abused:   . Sexually Abused:     PHYSICAL EXAM: *** General: No acute distress.  Patient appears well-groomed.   Head:  Normocephalic/atraumatic Eyes:  Fundi examined but not visualized Neck: supple, no paraspinal tenderness, full range of motion Heart:  Regular rate and rhythm Lungs:  Clear to auscultation bilaterally Back: No paraspinal tenderness Neurological Exam: alert and oriented to person, place, and time. Attention span and concentration intact, recent and remote memory intact, fund of knowledge intact.  Speech fluent and not dysarthric, language intact.  CN II-XII intact. Bulk and tone normal, muscle strength 5/5 throughout.  Sensation to light touch, temperature and vibration intact.  Deep tendon reflexes 2+ throughout, toes downgoing.  Finger to nose and heel to shin testing intact.  Gait normal, Romberg negative.  IMPRESSION: 1.  Bilateral hemispheric infarcts involving left subcortical white matter and  bilateral occipital and right frontal lobes, concerning for embolic source but also synchronized small vessel disease. 2.  Intracranial atherosclerosis/stenosis 3.  Cerebral amyloid angiopathy 4.  Hypertension 5.  Hyperllipidema   PLAN: ***  Metta Clines, DO  CC: ***

## 2020-01-12 ENCOUNTER — Ambulatory Visit: Payer: Medicare Other | Admitting: Neurology

## 2020-04-14 ENCOUNTER — Telehealth: Payer: Self-pay | Admitting: Cardiology

## 2020-04-14 NOTE — Telephone Encounter (Signed)
Left message to c/b - need to know what BP readings have been.

## 2020-04-14 NOTE — Telephone Encounter (Signed)
Called back and spoke with wife who reports pt's blood pressures have been elevated according the the facility pt lives.  She reports the staff there told her she should call the office to have pt's medications adjusted.  Advised wife before any changes could be made Dr Anne Fu would need to know pt's current medications and recent VS including multiple blood pressure readings.  Also advised the MD at the facility should be able to monitor and treat his blood pressure without an issue instead of pt having to wait to be seen here before it gets addressed.  Wife is in agreement.  She is going to speak with the med tech at his facility and have them fax his most recent VS and medication list.  We will f/u once these have been received and reviewed.

## 2020-04-14 NOTE — Telephone Encounter (Signed)
Pt c/o BP issue: STAT if pt c/o blurred vision, one-sided weakness or slurred speech  1. What are your last 5 BP readings?  [facility] assisted living has readings   2. Are you having any other symptoms (ex. Dizziness, headache, blurred vision, passed out)? Not that she is aware of   3. What is your BP issue? Nicholas Hancock is calling stating their provider at [facility] assisted living is wanting Nicholas Hancock to come in to be seen for his recent hypertension that has been occurring for that past week or so. She states they feel medication adjustments need to be made, but are hesitant of doing so without being advised by his Cardiologist. I advised Nicholas Hancock the first available appointment with a PA is not until 09/24 at this time, and she requested I send a message to try and get him worked in. Please advise.

## 2020-04-15 NOTE — Telephone Encounter (Signed)
Lm to call back ./cy 

## 2020-04-15 NOTE — Telephone Encounter (Signed)
V/S flow sheet received Will forward to Dr Anne Fu for review./cy

## 2020-04-19 ENCOUNTER — Telehealth: Payer: Self-pay | Admitting: Cardiology

## 2020-04-19 NOTE — Telephone Encounter (Signed)
Written order from Dr Anne Fu faxed as requested.  Spironolactone 100 mg QD and BMP in 1 week.

## 2020-04-19 NOTE — Telephone Encounter (Signed)
New message:     [place] from [facility] called. She said wife told her pt's medicine need to be increased and he will need lab work. Sherron Ales says she will need an order for both of these fax to [phone] please.

## 2020-04-19 NOTE — Telephone Encounter (Signed)
Per Dr Anne Fu - increase  Spironolactone to 100 mg po QD - BMET in one week.  Spoke with wife who is aware.  Pt will keep f/u appt as scheduled.

## 2020-04-26 NOTE — Progress Notes (Signed)
Cardiology Office Note   Date:  04/26/2020   ID:  Nicholas Hancock, DOB 1934-08-28, MRN [id number]  PCP:  Adrian Prince, MD  Cardiologist:  Dr. Anne Fu     6 month follow-up    History of Present Illness: Nicholas Hancock is a 85 y.o. male with pmh of difficult to control hypertension, first-degree AV blcok. PACs, mild AI, previous stroke(x2), dementia, cardiomyopathy with EF down to 40% (06/2019) who presents for a 6 month follow-up  He is a former patient of Dr. Yevonne Hancock.  Initially being seen for mainly hypertension management, it was controlled with just spironolactone. He had hypertension previously that responded to spironolactone.  Dr. Evlyn Kanner has been seeing him as well.  He has a history of first-degree AV block, PACs.  He had been placed on metoprolol several years ago because of PACs.  Been tested in the past for hyperaldosteronism and was positive.    We have been following him as well for aortic regurgitation.  Patient had aollow-up 10/11/19 with recent left basal ganglia embolic stroke on 08/03/2019 discharge echocardiogram showed EF of 40% without source of emboli.  Covid negative.  Short bursts of atrial tachycardia were noted but not atrial fib.  Recommendation by cardiology consulting team during hospitalization and review of notes was to start Toprol/Aldactone as well as continue low-dose aspirin.  He went through rehab. He was using a walker with slow shuffling gate. Bps were high and his Losartan was increased.   The patient lives at an assisted living and staff was reporting high Bps so spiro was increased to 110 mgdaily. Follow-up labs were stable   Today the patient is accompanied by his wife. Comes in a wheelchair although wife reports he generally does not use this. He uses a walker at the facility. He walks twice daily and does exercises 3 times weekly. Bps are generally high at the assisted living. The wife also takes them daily and reeprts SBP up to 200, but  it's generally 150-170.  Tries to follow low salt diet however dinner is provided by the facility and is not sure if it is low salt. The wife says patient gets agitated sometimes and believes this affects his BP readings. Apparently the PCP at the facility increased lasix to 40mg  daily for BP as well as LLE. Says PCP plans to get labs next week.  No complaints of chest pain or sob. The patient has some mild dementia that seems to be slowly worsening. BP here is elevated with heart rate of 60.    Past Medical History:  Diagnosis Date  . 1st degree AV block   . 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 Aortic valve insufficiency   . Arthritis    hands  . CVA (cerebral vascular accident) (HCC)   . Elevated PSA    monitored by Dr.  . Hypertension    takes diuretic for adrenal gland dysfunction, BP will be high without diuretic  . Vitamin D deficiency     Past Surgical History:  Procedure Laterality Date  . HAND SURGERY  09/21/1987   Enlarged mass on right palm overlying right long finger metacarpophalangeal joint removed  . INGUINAL HERNIA REPAIR  07/15/2012   Procedure: LAPAROSCOPIC BILATERAL INGUINAL HERNIA REPAIR;  Surgeon: 14/10/2011, MD;  Location: [facility] ORS;  Service: General;  Laterality: Bilateral;  Laparoscopic Bilateral Inguinal Hernia Repairs with Mesh  . left knee arthroscopy  3  years ago  . PELVIC FRACTURE SURGERY     chippped in high school  . TONSILLECTOMY AND ADENOIDECTOMY       Current Outpatient Medications  Medication Sig Dispense Refill  . acetaminophen (TYLENOL) 325 MG tablet Take 2 tablets (650 mg total) by mouth every 4 (four) hours as needed for mild pain (or temp > 37.5 C (99.5 F)).    Marland Kitchen aspirin EC 81 MG tablet Take 81 mg by mouth daily.    Marland Kitchen atorvastatin (LIPITOR) 80 MG tablet Take 1 tablet (80 mg total) by mouth daily. 30 tablet 5  . Calcium 167 MG CAPS Take 1 capsule (167 mg total) by  mouth daily. 30 capsule 0  . Cholecalciferol (VITAMIN D-3) 125 MCG (5000 UT) TABS Take 1 tablet by mouth every other day. 30 tablet 1  . Coenzyme Q10 200 MG capsule Take 200 mg by mouth daily.    . Flaxseed, Linseed, (FLAX SEEDS PO) Take 1 tablet by mouth daily.    Marland Kitchen losartan (COZAAR) 100 MG tablet Take 1 tablet (100 mg total) by mouth daily. 90 tablet 3  . metoprolol succinate (TOPROL-XL) 50 MG 24 hr tablet Take 1 tablet (50 mg total) by mouth daily. Take with or immediately following a meal. 30 tablet 0  . Multiple Vitamin (MULTIVITAMIN WITH MINERALS) TABS Take 1 tablet by mouth daily.    . Multiple Vitamins-Minerals (ZINC) LOZG Take 1 lozenge by mouth daily as needed (For cold symptoms).    . Omega-3 Fatty Acids (SALMON OIL-1000 PO) Take 1,000 mg by mouth daily.    Marland Kitchen spironolactone (ALDACTONE) 50 MG tablet Take 1 tablet (50 mg total) by mouth daily at 6 (six) AM. 30 tablet 1  . Ubiquinol 100 MG CAPS Take 1 capsule by mouth daily.    . vitamin B-12 (CYANOCOBALAMIN) 1000 MCG tablet Take 1,000 mcg by mouth daily.    . vitamin C (ASCORBIC ACID) 500 MG tablet Take 500 mg by mouth daily.     No current facility-administered medications for this visit.    Allergies:   Patient has no known allergies.    Social History:  The patient  reports that he quit smoking about 34 years ago. His smoking use included pipe. He quit after 20.00 years of use. He has never used smokeless tobacco. He reports current alcohol use. He reports that he does not use drugs.   Family History:  The patient's *family history includes Cancer in his father and mother; Diabetes in his father; Heart disease in his mother; Stroke in his father.    ROS:  General:no colds or fevers, no weight changes Skin:no rashes or ulcers HEENT:no blurred vision, no congestion CV:see HPI PUL:see HPI GI:no diarrhea constipation or melena, no indigestion GU:no hematuria, no dysuria MS:no joint pain, no claudication Neuro:no syncope, no  lightheadedness Endo:no diabetes, no thyroid disease Wt Readings from Last 3 Encounters:  09/21/19 157 lb (71.2 kg)  07/12/19 160 lb (72.6 kg)  10/27/18 159 lb (72.1 kg)     PHYSICAL EXAM: VS:  There were no vitals taken for this visit. , BMI There is no height or weight on file to calculate BMI. General:Pleasant affect, NAD Skin:Warm and dry, brisk capillary refill HEENT:normocephalic, sclera clear, mucus membranes moist Neck:supple, no JVD, no bruits  Heart:S1S2 RRR, +murmur, no gallup, rub or click Lungs:clear without rales, rhonchi, or wheezes MVH:QION, non tender, + BS, do not palpate liver spleen or masses Ext:no lower ext edema, 2+ pedal pulses, 2+ radial pulses Neuro:alert  and oriented, MAE, follows commands, + facial symmetry    EKG:  EKG is ordered today. The ekg ordered today demonstrates: N/A   Recent Labs: 07/20/2019: ALT 16; Hemoglobin 13.5; Platelets 151 08/03/2019: BUN 18; Creatinine, Ser 1.28; Potassium 3.4; Sodium 141    Lipid Panel    Component Value Date/Time   CHOL 94 (L) 09/21/2019 1421   TRIG 82 09/21/2019 1421   HDL 28 (L) 09/21/2019 1421   CHOLHDL 3.4 09/21/2019 1421   CHOLHDL 7.3 07/13/2019 0500   VLDL 16 07/13/2019 0500   LDLCALC 49 09/21/2019 1421       Other studies Reviewed: Additional studies/ records that were reviewed today include:. Echocardiogram 07/13/2019 while in the hospital for stroke:  1. Left ventricular ejection fraction, by visual estimation, is 35 to  40%. The left ventricle has moderately decreased function. Mildly  increased left ventricular posterior wall thickness. There is no left  ventricular hypertrophy.  2. Mildly dilated left ventricular internal cavity size.  3. Compared to report from Oct 2020, LVEF is depressed.  4. Global right ventricle has normal systolic function.The right  ventricular size is normal. No increase in right ventricular wall  thickness.  5. Left atrial size was moderately dilated.   6. Right atrial size was normal.  7. Trivial pericardial effusion is present.  8. The mitral valve is grossly normal. Mild mitral valve regurgitation.  9. The tricuspid valve is normal in structure. Tricuspid valve  regurgitation mild-moderate.  10. The aortic valve is tricuspid. Aortic valve regurgitation is mild.  Mild aortic valve sclerosis without stenosis.  11. The pulmonic valve was grossly normal. Pulmonic valve regurgitation is  trivial.  12. Normal pulmonary artery systolic pressure.  13. The interatrial septum was not assessed.   ASSESSMENT AND PLAN:   Uncontrolled Hypertension At the previous visit BPs elevated and Losartan and spiro increased. BP today 156/92. Wife reports SPBs up to 200 but generally 150-170. Patient asymptomatic. HR of 60. Will increase Toprol to 75mg  daily. Continue to take daily Bps and call in 1-2 weeks to report readings. Suspect he will need hydralazine. No amlodipine with low EF.   Chronic systolic CHF/NICM Last echo showed reduced EF down to 40% (mild drop from previous). Patient takes lasix 40 mg daily. Euvolemic on exam. PCP following creatinine. Requested they send lab work.   H/o of stroke November 2020 Continue aspirin and statin  Hyperlipidemia Last lipid profile 09/2019 showed chol 94, HDL 28, TG 82, LDL 49. Continue Atorvastatin  PACs Continue with metoprolol. Increase as above  Aortic Regurgitation Mild per echo 06/2019. Murmur on exam, but not significant   Current medicines are reviewed with the patient today.    The following changes have been made:  See above Labs/ tests ordered today include:see above  Disposition:   FU:  see above  Signed, 07/2019, NP  04/26/2020 2:35 PM    [facility] [address] [place], [place], [place]  [place] [address] [address] [place], [place] Phone: [phone]; Fax: [phone]  [phone]

## 2020-04-28 ENCOUNTER — Encounter: Payer: Self-pay | Admitting: Cardiology

## 2020-04-28 ENCOUNTER — Other Ambulatory Visit: Payer: Self-pay

## 2020-04-28 ENCOUNTER — Ambulatory Visit (INDEPENDENT_AMBULATORY_CARE_PROVIDER_SITE_OTHER): Payer: Medicare Other | Admitting: Medical

## 2020-04-28 VITALS — BP 156/92 | HR 60 | Ht 70.0 in | Wt 153.2 lb

## 2020-04-28 DIAGNOSIS — I5022 Chronic systolic (congestive) heart failure: Secondary | ICD-10-CM | POA: Diagnosis not present

## 2020-04-28 DIAGNOSIS — I10 Essential (primary) hypertension: Secondary | ICD-10-CM

## 2020-04-28 DIAGNOSIS — E785 Hyperlipidemia, unspecified: Secondary | ICD-10-CM

## 2020-04-28 DIAGNOSIS — I428 Other cardiomyopathies: Secondary | ICD-10-CM

## 2020-04-28 DIAGNOSIS — I351 Nonrheumatic aortic (valve) insufficiency: Secondary | ICD-10-CM | POA: Diagnosis not present

## 2020-04-28 MED ORDER — METOPROLOL SUCCINATE ER 25 MG PO TB24: 75.0000 mg | ORAL_TABLET | Freq: Every day | ORAL | 3 refills | Status: AC

## 2020-04-28 NOTE — Patient Instructions (Signed)
Medication Instructions:  Your physician has recommended you make the following change in your medication:  1.  INCREASE the Metoprolol to 25 mg taking 3 tablets by mouth daily   *If you need a refill on your cardiac medications before your next appointment, please call your pharmacy*   Lab Work: None ordered  If you have labs (blood work) drawn today and your tests are completely normal, you will receive your results only by: Marland Kitchen MyChart Message (if you have MyChart) OR . A paper copy in the mail If you have any lab test that is abnormal or we need to change your treatment, we will call you to review the results.   Testing/Procedures: None ordered   Follow-Up: At [facility], you and your health needs are our priority.  As part of our continuing mission to provide you with exceptional heart care, we have created designated Provider Care Teams.  These Care Teams include your primary Cardiologist (physician) and Advanced Practice Providers (APPs -  Physician Assistants and Nurse Practitioners) who all work together to provide you with the care you need, when you need it.  We recommend signing up for the patient portal called "MyChart".  Sign up information is provided on this After Visit Summary.  MyChart is used to connect with patients for Virtual Visits (Telemedicine).  Patients are able to view lab/test results, encounter notes, upcoming appointments, etc.  Non-urgent messages can be sent to your provider as well.   To learn more about what you can do with MyChart, go to [email or web address].    Your next appointment:   1 month(s)  The format for your next appointment:   In Person  Provider:   You may see Donato Schultz, MD or one of the following Advanced Practice Providers on your designated Care Team:    Norma Fredrickson, NP  Nada Boozer, NP  Georgie Chard, NP    Other Instructions

## 2020-05-31 ENCOUNTER — Ambulatory Visit: Payer: Medicare Other | Admitting: Cardiology

## 2020-06-15 ENCOUNTER — Telehealth (INDEPENDENT_AMBULATORY_CARE_PROVIDER_SITE_OTHER): Payer: Medicare Other | Admitting: Physician Assistant

## 2020-06-15 ENCOUNTER — Encounter: Payer: Self-pay | Admitting: Physician Assistant

## 2020-06-15 ENCOUNTER — Telehealth: Payer: Medicare Other | Admitting: Cardiology

## 2020-06-15 ENCOUNTER — Other Ambulatory Visit: Payer: Self-pay

## 2020-06-15 VITALS — BP 138/61 | HR 60 | Ht 70.0 in | Wt 149.0 lb

## 2020-06-15 DIAGNOSIS — I5020 Unspecified systolic (congestive) heart failure: Secondary | ICD-10-CM

## 2020-06-15 DIAGNOSIS — Z8673 Personal history of transient ischemic attack (TIA), and cerebral infarction without residual deficits: Secondary | ICD-10-CM

## 2020-06-15 DIAGNOSIS — I471 Supraventricular tachycardia: Secondary | ICD-10-CM

## 2020-06-15 DIAGNOSIS — I10 Essential (primary) hypertension: Secondary | ICD-10-CM

## 2020-06-15 NOTE — Progress Notes (Addendum)
Virtual Visit via Telephone Note   This visit type was conducted due to national recommendations for restrictions regarding the COVID-19 Pandemic (e.g. social distancing) in an effort to limit this patient's exposure and mitigate transmission in our community.  Due to his co-morbid illnesses, this patient is at least at moderate risk for complications without adequate follow up.  This format is felt to be most appropriate for this patient at this time.  The patient did not have access to video technology/had technical difficulties with video requiring transitioning to audio format only (telephone).  All issues noted in this document were discussed and addressed.  No physical exam could be performed with this format.  Please refer to the patient's chart for his  consent to telehealth for [facility].    Date:  06/15/2020   ID:  Nicholas Hancock, DOB 01/04/1935, MRN [id number] The patient was identified using 2 identifiers.  Patient Location: Home Provider Location: Home Office  PCP:  Angela Cox, MD  Cardiologist:  Donato Schultz, MD  Electrophysiologist:  None   Evaluation Performed:  Follow-Up Visit  Chief Complaint:  Follow-up (HTN, CHF)    Patient Profile: Nicholas Hancock is a 85 y.o. male with:  Heart failure with reduced ejection fraction   Echocardiogram 06/2019: EF 35-40  Aortic insufficiency   Mild by echocardiogram 06/2019  Atrial tachycardia   Hypertension (difficult to control)  Hyperaldosteronism   First-degree AV block  PACs  Hx of CVA (x2)  Admx in 06/2019 w recurrent CVA - MRI w evidence of poss Cerebral Amyloid Angiopathy - pt not a candidate for anticoagulation   Dementia   Lives at [facility]   Prior CV Studies: Carotid US 07/13/2019 Bilateral ICA 1-39  Echocardiogram 07/13/2019 EF 35-40, mild posterior wall LVH, normal RVSF, moderate LAE, trivial pericardial effusion, mild MR, mild-moderate TR, mild AI, mild AV sclerosis (no  aortic stenosis)  Echocardiogram 05/25/2019 EF 55-60, GR 1 DD, normal RVSF, severe LAE, trace MR, trivial TR, moderate AI, mild-moderate aortic valve sclerosis without stenosis, mild PI, RVSP 30.3  Event monitor 12/24/2017  Sinus rhythm with first-degree AV block at baseline.  Occasional premature atrial contractions and premature ventricular contractions.  No atrial fibrillation, no adverse arrhythmias, no pauses.  Palpitations are secondary to premature atrial contractions and premature ventricular contractions. Overall reassuring.    History of Present Illness:   Nicholas Hancock was admitted in November 2020 with recurrent stroke.  He had multiple areas of infarcts on MRI.  He was noted to have atrial tachycardia on monitoring.  There was no evidence of atrial fibrillation.  MRI did demonstrate evidence of possible cerebral amyloid angiopathy.  He is not felt to be a candidate for anticoagulation.  Therefore, he did not undergo ILR implantation.  Of note, his EF was noted to be reduced at 35-40%.  He was seen by Dr. Cristal Deer in the hospital who compared his previous echocardiogram in October 2020.  It appeared to be mildly reduced at that time and there was only a slight difference in ejection fraction on the echocardiogram in November.  Overall, it was felt that medical therapy was the most appropriate.  He was last seen in clinic by Cadence Furth, PA-C in 9/21.  His blood pressure remained uncontrolled.  His metoprolol succinate was increased.  He is seen for follow-up.    His wife, Nicholas Hancock (DPR on file) provides all the history due to the patient's dementia.  She notes that it is often  hard to get a blood pressure reading on him because he screams, out making it hard to hear.  However, she notes that his blood pressures are typically close to what was obtained today if not lower.  He has not really seemed to complain of chest discomfort.  He does not seem to be short of breath.  He  has not had syncope or significant leg swelling.  Physical therapy and Occupational Therapy have been working with him at the assisted living facility.  His balance seems to be a little bit worse and he is not allowed to use a walker now.  He is only using a wheelchair.  The attending physician at the facility has recently discontinued his furosemide due to worsening renal function.  Past Medical History:  Diagnosis Date   1st degree AV block    Adrenal gland dysfunction (HCC)    hyperaldesteroa- takes diuretic for over production of aldosterone   Aortic valve insufficiency    Arthritis    hands   CVA (cerebral vascular accident) (HCC)    Elevated PSA    monitored by Dr. Earlene Plater   Hypertension    takes diuretic for adrenal gland dysfunction, BP will be high without diuretic   Vitamin D deficiency    Past Surgical History:  Procedure Laterality Date   HAND SURGERY  09/21/1987   Enlarged mass on right palm overlying right long finger metacarpophalangeal joint removed   INGUINAL HERNIA REPAIR  07/15/2012   Procedure: LAPAROSCOPIC BILATERAL INGUINAL HERNIA REPAIR;  Surgeon: Valarie Merino, MD;  Location: [facility];  Service: General;  Laterality: Bilateral;  Laparoscopic Bilateral Inguinal Hernia Repairs with Mesh   left knee arthroscopy  3 years ago   PELVIC FRACTURE SURGERY     chippped in high school   TONSILLECTOMY AND ADENOIDECTOMY       Current Meds  Medication Sig   aspirin EC 81 MG tablet Take 81 mg by mouth daily.   atorvastatin (LIPITOR) 80 MG tablet Take 1 tablet (80 mg total) by mouth daily.   Cholecalciferol (VITAMIN D-3) 125 MCG (5000 UT) TABS Take 1 tablet by mouth every other day.   Coenzyme Q10 200 MG capsule Take 200 mg by mouth daily.   loperamide (TGT LOPERAMIDE HCL) 2 MG capsule Take by mouth 3 (three) times daily as needed for diarrhea or loose stools.   LORazepam (ATIVAN) 0.5 MG tablet Take 0.5 mg by mouth 2 (two) times daily as needed for  anxiety.   losartan (COZAAR) 50 MG tablet Take 50 mg by mouth 2 (two) times daily.   metoprolol succinate (TOPROL XL) 25 MG 24 hr tablet Take 3 tablets (75 mg total) by mouth daily.   potassium chloride (MICRO-K) 10 MEQ CR capsule Take 10 mEq by mouth daily.    spironolactone (ALDACTONE) 100 MG tablet Take 100 mg by mouth daily.   thiamine (VITAMIN B-1) 100 MG tablet Take 100 mg by mouth daily.   traZODone (DESYREL) 50 MG tablet Take 50 mg by mouth at bedtime.   vitamin B-12 (CYANOCOBALAMIN) 1000 MCG tablet Take 1,000 mcg by mouth daily.   vitamin C (ASCORBIC ACID) 500 MG tablet Take 500 mg by mouth daily.     Allergies:   Patient has no known allergies.   Social History   Tobacco Use   Smoking status: Former Smoker    Years: 20.00    Types: Pipe    Quit date: 05/20/1985    Years since quitting: 35.0   Smokeless  tobacco: Never Used  Substance Use Topics   Alcohol use: Yes    Comment: approx 4 beers a week   Drug use: No     Family Hx: The patient's family history includes Cancer in his father and mother; Diabetes in his father; Heart disease in his mother; Stroke in his father.  ROS:   Please see the history of present illness.     Labs/Other Tests and Data Reviewed:    EKG:  No ECG reviewed.  Recent Labs: 07/20/2019: ALT 16; Hemoglobin 13.5; Platelets 151 08/03/2019: BUN 18; Creatinine, Ser 1.28; Potassium 3.4; Sodium 141   Recent Lipid Panel Lab Results  Component Value Date/Time   CHOL 94 (L) 09/21/2019 02:21 PM   TRIG 82 09/21/2019 02:21 PM   HDL 28 (L) 09/21/2019 02:21 PM   CHOLHDL 3.4 09/21/2019 02:21 PM   CHOLHDL 7.3 07/13/2019 05:00 AM   LDLCALC 49 09/21/2019 02:21 PM    Wt Readings from Last 3 Encounters:  06/15/20 149 lb (67.6 kg)  04/28/20 153 lb 3.2 oz (69.5 kg)  09/21/19 157 lb (71.2 kg)     Risk Assessment/Calculations:      Objective:    Vital Signs:  BP 138/61    Pulse 60    Ht 5\' 10"  (1.778 m)    Wt 149 lb (67.6 kg)    BMI  21.38 kg/m    VITAL SIGNS:  reviewed  ASSESSMENT & PLAN:    1. HFrEF (heart failure with reduced ejection fraction) (HCC) EF approximately 40%.  Etiology not entirely clear.  The patient is being managed conservatively due to age and advanced dementia.  He is fairly sedentary.  He is only allowed to use a wheelchair due to poor balance and decreased functional status.  Based upon current reports, his volume status seems to be fairly stable.  He is on a good medical regimen which includes losartan, metoprolol succinate and spironolactone.  Continue current therapy.  His furosemide was recently stopped due to worsening renal function.  We will contact the facility to request lab results and recent notes.  We will ask that the facility monitor for increased leg swelling or sudden increase in weight.  Follow-up with Dr. in 3 months.  2. Essential hypertension Blood pressure seems to be well controlled.  Continue current therapy.  3. Atrial tachycardia (HCC) Continue beta-blocker therapy.  4. History of CVA (cerebrovascular accident) As noted, he was not referred for ILR due to cerebral amyloid angiopathy.  He is not a candidate for anticoagulation.    Time:   Today, I have spent 11 minutes with the patient with telehealth technology discussing the above problems.     Medication Adjustments/Labs and Tests Ordered: Current medicines are reviewed at length with the patient today.  Concerns regarding medicines are outlined above.   Tests Ordered: No orders of the defined types were placed in this encounter.   Medication Changes: No orders of the defined types were placed in this encounter.   Follow Up:  In Person in 3 month(s)  Signed, Anne Fu, PA-C  06/15/2020 5:02 PM    [facility]

## 2020-06-15 NOTE — Patient Instructions (Signed)
Medication Instructions:  Your physician recommends that you continue on your current medications as directed. Please refer to the Current Medication list given to you today.  *If you need a refill on your cardiac medications before your next appointment, please call your pharmacy*  Lab Work: None ordered today  Testing/Procedures: None ordered today  Follow-Up: At [facility], you and your health needs are our priority.  As part of our continuing mission to provide you with exceptional heart care, we have created designated Provider Care Teams.  These Care Teams include your primary Cardiologist (physician) and Advanced Practice Providers (APPs -  Physician Assistants and Nurse Practitioners) who all work together to provide you with the care you need, when you need it.  Your next appointment:   3 month(s)  The format for your next appointment:   In Person  Provider:   Donato Schultz, MD

## 2020-12-14 ENCOUNTER — Other Ambulatory Visit (HOSPITAL_COMMUNITY): Payer: Self-pay

## 2020-12-14 DIAGNOSIS — R1310 Dysphagia, unspecified: Secondary | ICD-10-CM

## 2021-01-02 ENCOUNTER — Other Ambulatory Visit: Payer: Self-pay

## 2021-01-02 ENCOUNTER — Ambulatory Visit (HOSPITAL_COMMUNITY)
Admission: RE | Admit: 2021-01-02 | Discharge: 2021-01-02 | Disposition: A | Discharge status: Home / Self Care | Payer: Medicare Other | Source: Ambulatory Visit | Attending: Internal Medicine | Admitting: Internal Medicine

## 2021-01-02 DIAGNOSIS — R1312 Dysphagia, oropharyngeal phase: Secondary | ICD-10-CM | POA: Insufficient documentation

## 2021-01-02 DIAGNOSIS — R1310 Dysphagia, unspecified: Secondary | ICD-10-CM | POA: Insufficient documentation

## 2022-05-13 DEATH — deceased

## 2023-04-07 IMAGING — RF DG SWALLOWING FUNCTION
12 of 24 series · 12 of 24 positions shown · non-contrast
Comparison: None.

CLINICAL DATA: Dysphagia, history of stroke

EXAM:
MODIFIED BARIUM SWALLOW
TECHNIQUE: Different consistencies of barium were administered orally to the
patient by the Speech Pathologist. Imaging of the pharynx was
performed in the lateral projection. The radiologist was present in
the fluoroscopy room for this study, providing personal supervision.
FLUOROSCOPY TIME:  Fluoroscopy Time:  5 minutes 7 seconds
Radiation Exposure Index (if provided by the fluoroscopic device):
40.12 mGy
Number of Acquired Spot Images: 0

[Series 2: run · 1 of 51 frames shown (1 of 12)]
[frame 8/51]
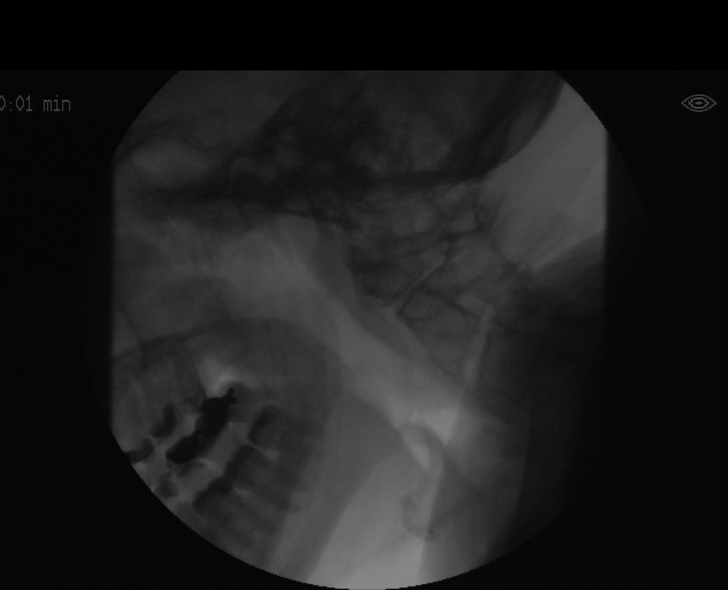

[Series 4: run · 1 of 290 frames shown (2 of 12)]
[frame 10/290]
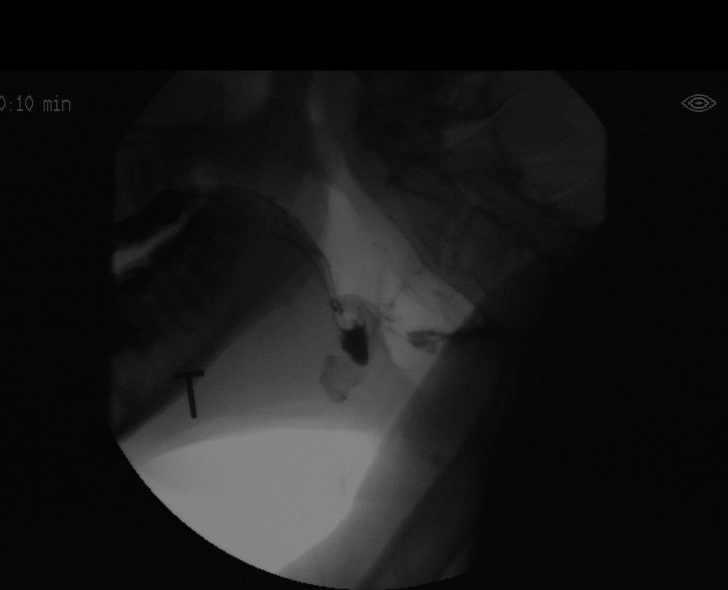

[Series 6: run · 1 of 37 frames shown (3 of 12)]
[frame 6/37]
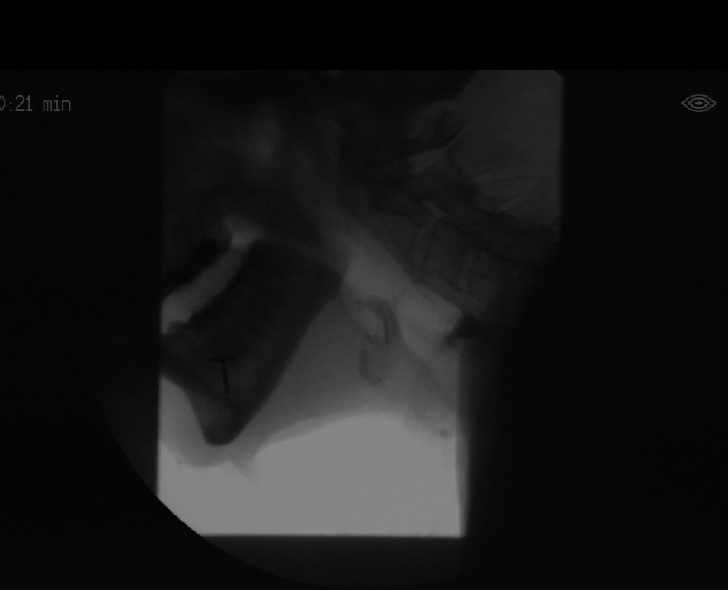

[Series 8: run · 1 of 182 frames shown (4 of 12)]
[frame 28/182]
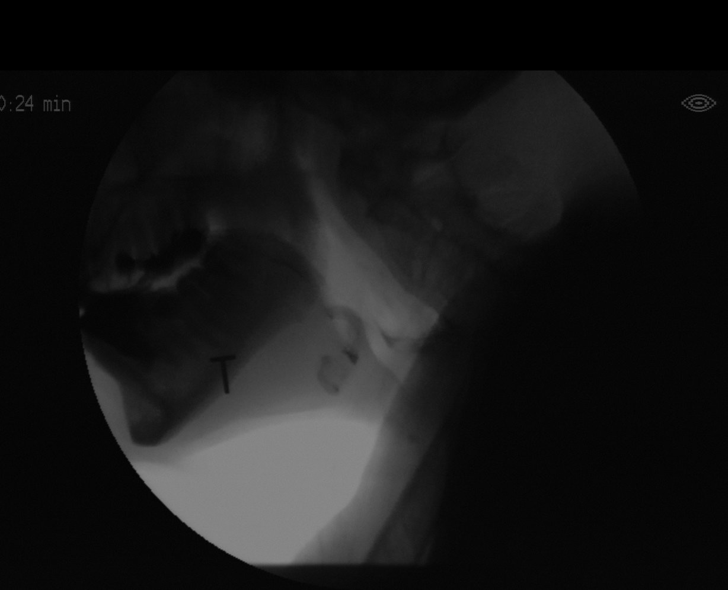

[Series 10: run · 1 of 70 frames shown (5 of 12)]
[frame 36/70]
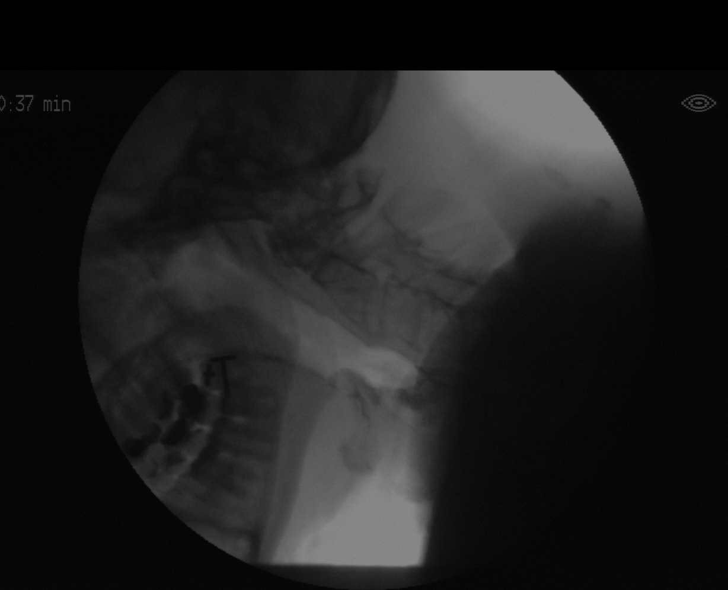

[Series 12: run · 1 of 295 frames shown (6 of 12)]
[frame 148/295]
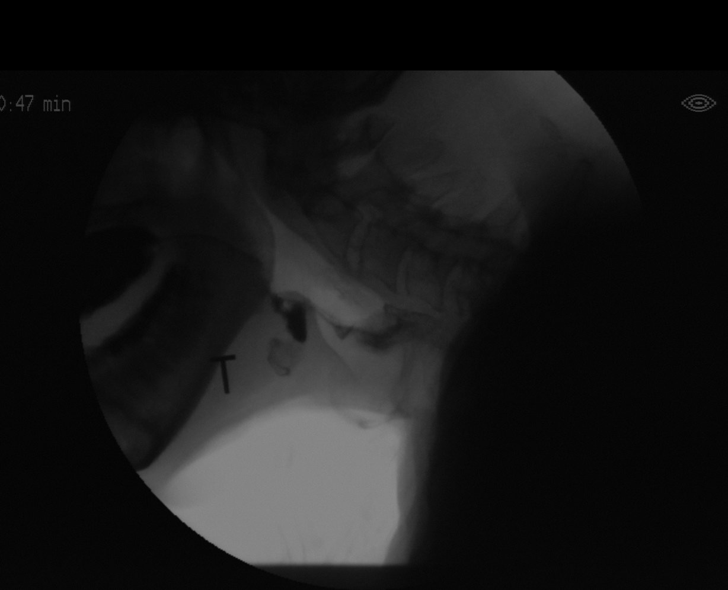

[Series 14: run · 1 of 292 frames shown (7 of 12)]
[frame 44/292]
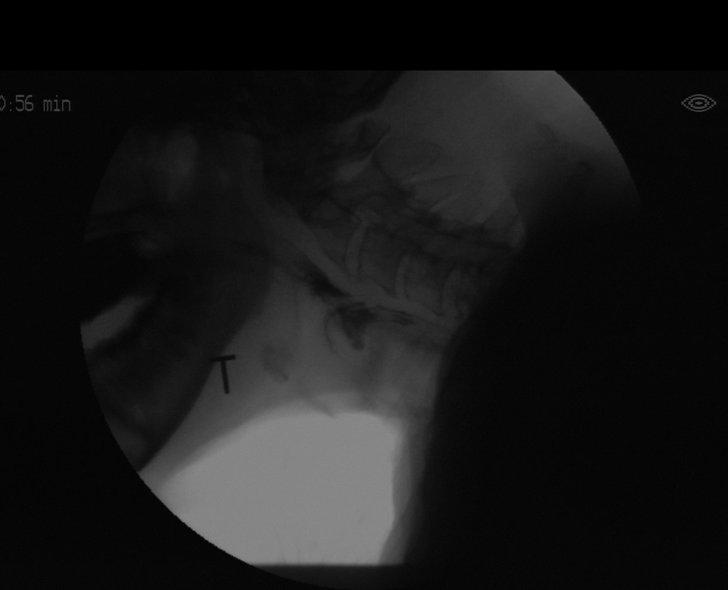

[Series 16: run · 1 of 230 frames shown (8 of 12)]
[frame 35/230]
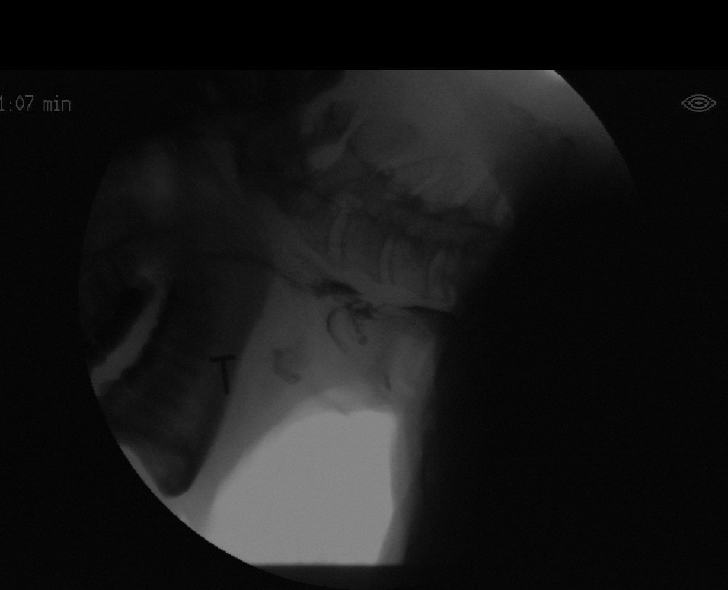

[Series 18: run · 1 of 111 frames shown (9 of 12)]
[frame 56/111]
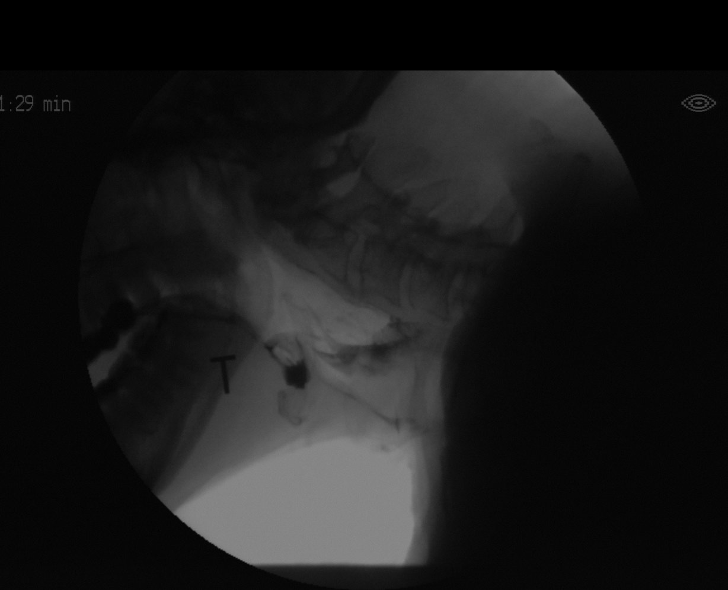

[Series 20: run · 1 of 58 frames shown (10 of 12)]
[frame 50/58]
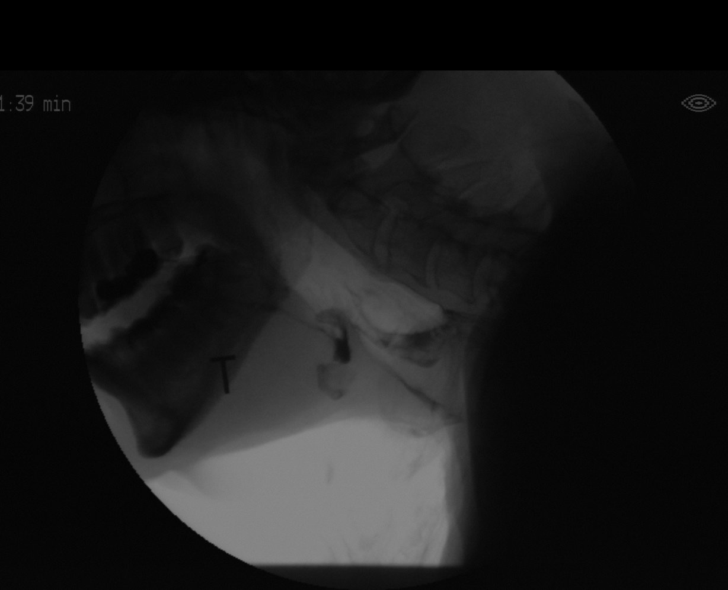

[Series 22: run · 1 of 443 frames shown (11 of 12)]
[frame 377/443]
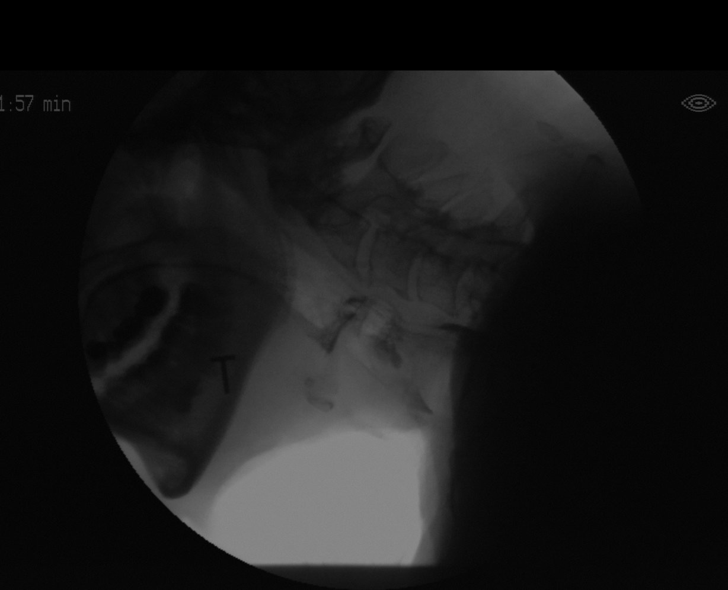

[Series 24: run · 1 of 315 frames shown (12 of 12)]
[frame 268/315]
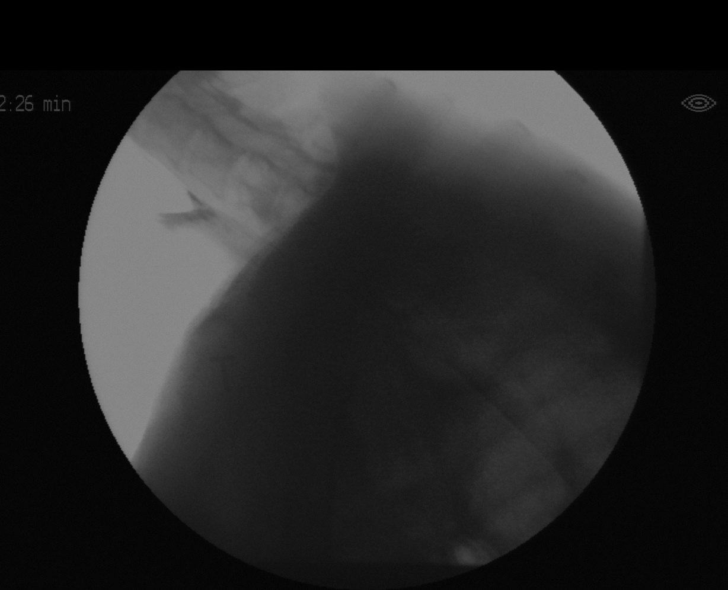

[12 of 24 positions shown; findings below may reference images not displayed]

FINDINGS: Cervical spine degenerative changes and kyphotic thoracic spine not
well evaluated but altering patient position for the swallow study.
Swallowing performed across multiple consistencies ranging from thin
barium to barium with cracker. Across all consistencies there was
spilling to the vallecula with deep penetration and subsequent small
volume silent aspiration. Some of this cleared with prompted
coughing and throat clearing.

Visualized proximal esophagus distends normally. However, the barium
tablet became transiently arrested at this level clearing with
subsequent swallow. The esophagus is in nearly horizontal position
during swallowing secondary to patient posterior.
IMPRESSION: Deep penetration and subsequent small volume silent aspiration
across all consistencies.

Near horizontal position of the esophagus and airway secondary to
patient position may contribute to above findings and the transient
arrest of the barium tablet that was noted on the current study.
Proximal esophagus distending normally upon swallowing.

Stasis in vallecula and Piriforms.

Please refer to the Speech Pathologists report for complete details
and recommendations.
